# Patient Record
Sex: Female | Born: 2000 | Race: White | Hispanic: No | Smoking: Never smoker
Health system: Southern US, Community
[De-identification: ages and names within clinical notes are randomized; demographics above are authoritative.]

## PROBLEM LIST (undated history)

## (undated) ENCOUNTER — Inpatient Hospital Stay (HOSPITAL_COMMUNITY): Payer: Self-pay

## (undated) ENCOUNTER — Inpatient Hospital Stay (HOSPITAL_COMMUNITY): Payer: Medicaid Other

## (undated) DIAGNOSIS — I639 Cerebral infarction, unspecified: Secondary | ICD-10-CM

## (undated) DIAGNOSIS — Z9889 Other specified postprocedural states: Secondary | ICD-10-CM

## (undated) DIAGNOSIS — S61459A Open bite of unspecified hand, initial encounter: Secondary | ICD-10-CM

## (undated) DIAGNOSIS — L680 Hirsutism: Secondary | ICD-10-CM

## (undated) DIAGNOSIS — J302 Other seasonal allergic rhinitis: Secondary | ICD-10-CM

## (undated) DIAGNOSIS — S060X0A Concussion without loss of consciousness, initial encounter: Secondary | ICD-10-CM

## (undated) DIAGNOSIS — F909 Attention-deficit hyperactivity disorder, unspecified type: Secondary | ICD-10-CM

## (undated) DIAGNOSIS — Z872 Personal history of diseases of the skin and subcutaneous tissue: Secondary | ICD-10-CM

## (undated) HISTORY — PX: WISDOM TOOTH EXTRACTION: SHX21

## (undated) HISTORY — DX: Hirsutism: L68.0

## (undated) HISTORY — PX: CYST EXCISION: SHX5701

## (undated) HISTORY — DX: Cerebral infarction, unspecified: I63.9

## (undated) HISTORY — DX: Personal history of diseases of the skin and subcutaneous tissue: Z87.2

## (undated) HISTORY — DX: Other specified postprocedural states: Z98.890

---
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
## 2018-04-18 ENCOUNTER — Encounter (HOSPITAL_COMMUNITY): Payer: Self-pay | Admitting: Emergency Medicine

## 2018-04-18 ENCOUNTER — Other Ambulatory Visit: Payer: Self-pay

## 2018-04-18 ENCOUNTER — Ambulatory Visit (HOSPITAL_COMMUNITY)
Admission: EM | Admit: 2018-04-18 | Discharge: 2018-04-18 | Disposition: A | Discharge status: Home / Self Care | Payer: Medicaid Other | Attending: Family Medicine | Admitting: Family Medicine

## 2018-04-18 DIAGNOSIS — N898 Other specified noninflammatory disorders of vagina: Secondary | ICD-10-CM | POA: Diagnosis present

## 2018-04-18 DIAGNOSIS — N3090 Cystitis, unspecified without hematuria: Secondary | ICD-10-CM | POA: Diagnosis present

## 2018-04-18 LAB — POCT URINALYSIS DIP (DEVICE)
Bilirubin Urine: NEGATIVE
Glucose, UA: NEGATIVE mg/dL
Ketones, ur: NEGATIVE mg/dL
Nitrite: NEGATIVE
Protein, ur: NEGATIVE mg/dL
Specific Gravity, Urine: 1.02 (ref 1.005–1.030)
Urobilinogen, UA: 1 mg/dL (ref 0.0–1.0)
pH: 7.5 (ref 5.0–8.0)

## 2018-04-18 MED ORDER — FLUCONAZOLE 150 MG PO TABS: ORAL_TABLET | ORAL | 0 refills | Status: DC

## 2018-04-18 MED ORDER — CEPHALEXIN 500 MG PO CAPS: 500.0000 mg | ORAL_CAPSULE | Freq: Two times a day (BID) | ORAL | 0 refills | Status: DC

## 2018-04-18 NOTE — ED Triage Notes (Signed)
Burning with urination, urinates in small amounts.  Onset of symptoms one week ago.  Denies back pain, denies stomach pain

## 2018-04-18 NOTE — ED Provider Notes (Addendum)
[place]   [id number] 04/18/18 Arrival Time: 9678  ASSESSMENT & PLAN:  1. Cystitis   2. Vaginal discharge    Meds ordered this encounter  Medications  . cephALEXin (KEFLEX) 500 MG capsule    Sig: Take 1 capsule (500 mg total) by mouth 2 (two) times daily.    Dispense:  10 capsule    Refill:  0  . fluconazole (DIFLUCAN) 150 MG tablet    Sig: Take one tablet by mouth as a single dose. May repeat in 3 days if symptoms persist.    Dispense:  2 tablet    Refill:  0   Diflucan given upon pt request. No signs of pyelonephritis/PID. Urine culture sent.  Prefers to await vaginal cytology results; declines any empiric treatment. Declines HIV/RPR testing.   Discharge Instructions     We have sent testing for sexually transmitted infections. We will notify you of any positive results once they are received. If required, we will prescribe any medications you might need.  Please refrain from all sexual activity for at least the next seven days.     Pending: Labs Reviewed  POCT URINALYSIS DIP (DEVICE) - Abnormal; Notable for the following components:      Result Value   Hgb urine dipstick MODERATE (*)    Leukocytes,Ua TRACE (*)    All other components within normal limits  URINE CULTURE  CERVICOVAGINAL ANCILLARY ONLY    Will notify of any positive results. Instructed to refrain from sexual activity for at least seven days.  Reviewed expectations re: course of current medical issues. Questions answered. Outlined signs and symptoms indicating need for more acute intervention. Patient verbalized understanding. After Visit Summary given.   SUBJECTIVE:  Tina Gibbs is a 18 y.o. female who presents with complaint of urinary frequency and dysuria; x 1 week. No hematuria. Slight vaginal discharge for at least one week; reports h/o BV; no vaginal odor. Describes discharge as thin and clear. Afebrile. No abdominal or pelvic pain. No n/v. No rashes or lesions.  Sexually active with single female partner without regular condom use. OTC treatment: none reported..  Patient's last menstrual period was 04/11/2018.  ROS: As per HPI. All other systems negative.   OBJECTIVE:  Vitals:   04/18/18 1340  BP: 119/64  Pulse: 86  Resp: 18  Temp: 98.1 F (36.7 C)  TempSrc: Oral  SpO2: 100%    General appearance: alert, cooperative, appears stated age and no distress Throat: lips, mucosa, and tongue normal; teeth and gums normal CV: RRR Lungs: CTAB Back: no CVA tenderness; FROM at waist Abdomen: soft, non-tender; no guarding or rebound tenderness GU: deferred Skin: warm and dry Psychological: alert and cooperative; normal mood and affect.  Labs Reviewed  POCT URINALYSIS DIP (DEVICE) - Abnormal; Notable for the following components:      Result Value   Hgb urine dipstick MODERATE (*)    Leukocytes,Ua TRACE (*)    All other components within normal limits  URINE CULTURE  CERVICOVAGINAL ANCILLARY ONLY    Allergies  Allergen Reactions  . Lactose Intolerance (Gi)   . Peanut-Containing Drug Products Itching    Past Medical History:  Diagnosis Date  . Asthma    4/06 IgE + for house dust, mites, and cat; skin tested + for dog, house dust, mites, rat, tomato, aternaria alternata  . Attention deficit hyperactivity disorder 5/12  . H/O excision of epidermal inclusion cyst 10/05  . Hirsutism 3/11  . Seasonal allergies    Family  History  Problem Relation Age of Onset  . Healthy Mother   . Healthy Father    Social History   Socioeconomic History  . Marital status: Single    Spouse name: Not on file  . Number of children: Not on file  . Years of education: Not on file  . Highest education level: Not on file  Occupational History  . Not on file  Social Needs  . Financial resource strain: Not on file  . Food insecurity:    Worry: Not on file    Inability: Not on file  . Transportation needs:    Medical: Not on file    Non-medical:  Not on file  Tobacco Use  . Smoking status: Never Smoker  . Smokeless tobacco: Never Used  Substance and Sexual Activity  . Alcohol use: No  . Drug use: No  . Sexual activity: Not on file  Lifestyle  . Physical activity:    Days per week: Not on file    Minutes per session: Not on file  . Stress: Not on file  Relationships  . Social connections:    Talks on phone: Not on file    Gets together: Not on file    Attends religious service: Not on file    Active member of club or organization: Not on file    Attends meetings of clubs or organizations: Not on file    Relationship status: Not on file  . Intimate partner violence:    Fear of current or ex partner: Not on file    Emotionally abused: Not on file    Physically abused: Not on file    Forced sexual activity: Not on file  Other Topics Concern  . Not on file  Social History Narrative   Lives with mom and 2 sisters.  Mom uses public transportation.  Does not have a car.   Patient in middle school.          Vanessa Kick, MD 04/18/18 1357    Vanessa Kick, MD 04/18/18 1357    Vanessa Kick, MD 04/18/18 1359

## 2018-04-18 NOTE — ED Notes (Signed)
Patient requested diflucan, notified dr hagler.  Med sent through [facility] to pharmacy and patient notified

## 2018-04-18 NOTE — Discharge Instructions (Signed)
We have sent testing for sexually transmitted infections. We will notify you of any positive results once they are received. If required, we will prescribe any medications you might need.  Please refrain from all sexual activity for at least the next seven days.  

## 2018-04-19 LAB — CERVICOVAGINAL ANCILLARY ONLY
Bacterial vaginitis: NEGATIVE
Candida vaginitis: NEGATIVE
Chlamydia: NEGATIVE
Neisseria Gonorrhea: NEGATIVE
Trichomonas: NEGATIVE

## 2018-04-20 ENCOUNTER — Telehealth (HOSPITAL_COMMUNITY): Payer: Self-pay | Admitting: Emergency Medicine

## 2018-04-20 LAB — URINE CULTURE: Culture: 50000 — AB

## 2018-04-20 NOTE — Telephone Encounter (Signed)
Urine culture was positive for  STAPHYLOCOCCUS SPECIES (COAGULASE NEGATIVE  and was given keflex  at urgent care visit. Pt contacted and made aware, educated on completing antibiotic and to follow up if symptoms are persistent. Verbalized understanding.

## 2018-06-09 ENCOUNTER — Encounter (HOSPITAL_COMMUNITY): Payer: Self-pay

## 2018-06-09 ENCOUNTER — Inpatient Hospital Stay (HOSPITAL_COMMUNITY)
Admission: AD | Admit: 2018-06-09 | Discharge: 2018-06-09 | Disposition: A | Discharge status: Home / Self Care | Payer: Medicaid Other | Attending: Obstetrics & Gynecology | Admitting: Obstetrics & Gynecology

## 2018-06-09 ENCOUNTER — Other Ambulatory Visit: Payer: Self-pay

## 2018-06-09 ENCOUNTER — Inpatient Hospital Stay (HOSPITAL_COMMUNITY): Payer: Medicaid Other

## 2018-06-09 DIAGNOSIS — O26899 Other specified pregnancy related conditions, unspecified trimester: Secondary | ICD-10-CM | POA: Diagnosis not present

## 2018-06-09 DIAGNOSIS — O99511 Diseases of the respiratory system complicating pregnancy, first trimester: Secondary | ICD-10-CM | POA: Diagnosis not present

## 2018-06-09 DIAGNOSIS — O3680X0 Pregnancy with inconclusive fetal viability, not applicable or unspecified: Secondary | ICD-10-CM

## 2018-06-09 DIAGNOSIS — Z3A01 Less than 8 weeks gestation of pregnancy: Secondary | ICD-10-CM | POA: Diagnosis not present

## 2018-06-09 DIAGNOSIS — J45909 Unspecified asthma, uncomplicated: Secondary | ICD-10-CM | POA: Diagnosis not present

## 2018-06-09 DIAGNOSIS — O219 Vomiting of pregnancy, unspecified: Secondary | ICD-10-CM | POA: Diagnosis not present

## 2018-06-09 DIAGNOSIS — R109 Unspecified abdominal pain: Secondary | ICD-10-CM | POA: Diagnosis not present

## 2018-06-09 DIAGNOSIS — M549 Dorsalgia, unspecified: Secondary | ICD-10-CM | POA: Diagnosis not present

## 2018-06-09 DIAGNOSIS — O26891 Other specified pregnancy related conditions, first trimester: Secondary | ICD-10-CM | POA: Insufficient documentation

## 2018-06-09 LAB — CBC WITH DIFFERENTIAL/PLATELET
Abs Immature Granulocytes: 0.02 10*3/uL (ref 0.00–0.07)
Basophils Absolute: 0 10*3/uL (ref 0.0–0.1)
Basophils Relative: 0 %
Eosinophils Absolute: 0 10*3/uL (ref 0.0–0.5)
Eosinophils Relative: 0 %
HCT: 35.1 % — ABNORMAL LOW (ref 36.0–46.0)
Hemoglobin: 12.3 g/dL (ref 12.0–15.0)
Immature Granulocytes: 0 %
Lymphocytes Relative: 19 %
Lymphs Abs: 1.3 10*3/uL (ref 0.7–4.0)
MCH: 31.8 pg (ref 26.0–34.0)
MCHC: 35 g/dL (ref 30.0–36.0)
MCV: 90.7 fL (ref 80.0–100.0)
Monocytes Absolute: 0.8 10*3/uL (ref 0.1–1.0)
Monocytes Relative: 12 %
Neutro Abs: 4.7 10*3/uL (ref 1.7–7.7)
Neutrophils Relative %: 69 %
Platelets: 191 10*3/uL (ref 150–400)
RBC: 3.87 MIL/uL (ref 3.87–5.11)
RDW: 11.9 % (ref 11.5–15.5)
WBC: 6.9 10*3/uL (ref 4.0–10.5)
nRBC: 0 % (ref 0.0–0.2)

## 2018-06-09 LAB — POC URINE PREG, ED: Preg Test, Ur: POSITIVE — AB

## 2018-06-09 LAB — HCG, QUANTITATIVE, PREGNANCY: hCG, Beta Chain, Quant, S: 666 m[IU]/mL — ABNORMAL HIGH (ref <5)

## 2018-06-09 LAB — WET PREP, GENITAL
Clue Cells Wet Prep HPF POC: NONE SEEN
Sperm: NONE SEEN
Trich, Wet Prep: NONE SEEN
Yeast Wet Prep HPF POC: NONE SEEN

## 2018-06-09 IMAGING — US OBSTETRIC <14 WK ULTRASOUND
1 series · 15 of 28 positions shown · non-contrast
Comparison: None.

CLINICAL DATA: Abdominal pain

EXAM:
OBSTETRIC <14 WK US AND TRANSVAGINAL OB US
TECHNIQUE: Both transabdominal and transvaginal ultrasound examinations were
performed for complete evaluation of the gestation as well as the
maternal uterus, adnexal regions, and pelvic cul-de-sac.
Transvaginal technique was performed to assess early pregnancy.

[Series 1: obstetric <14 wk ultrasound · 15 of 53 slices shown]
[im 1/53]
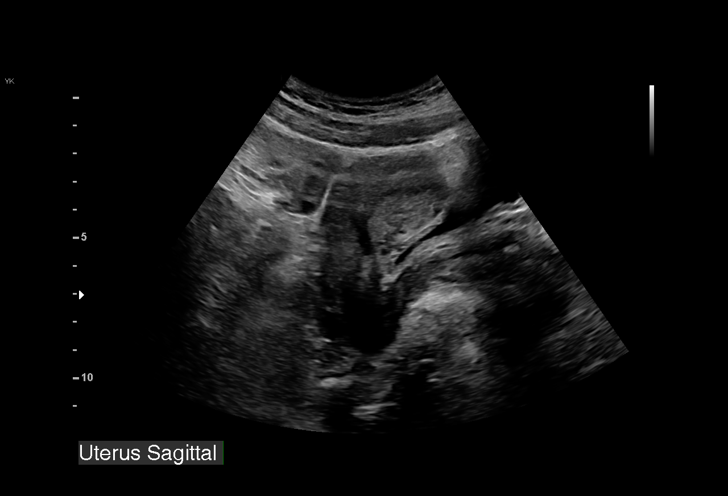
[im 4/53]
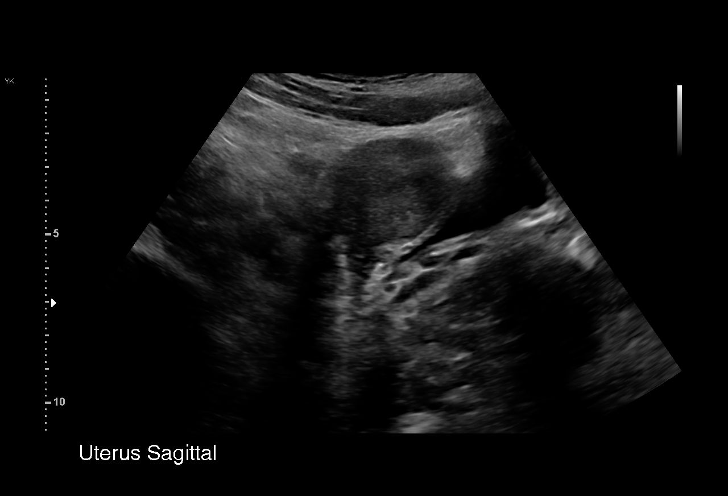
[im 8/53]
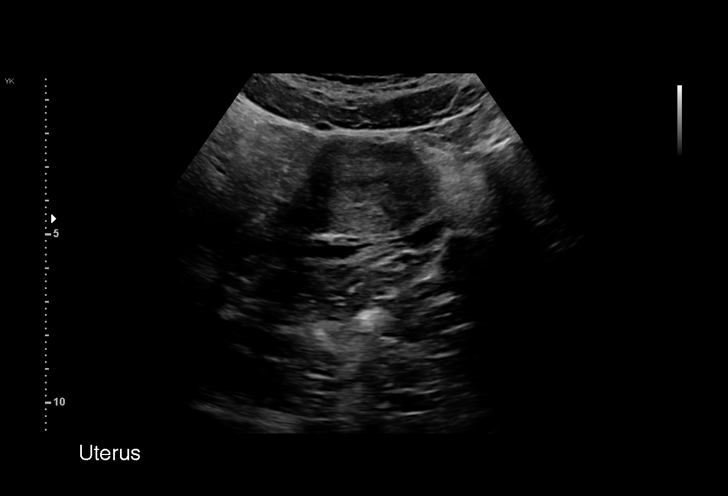
[im 12/53]
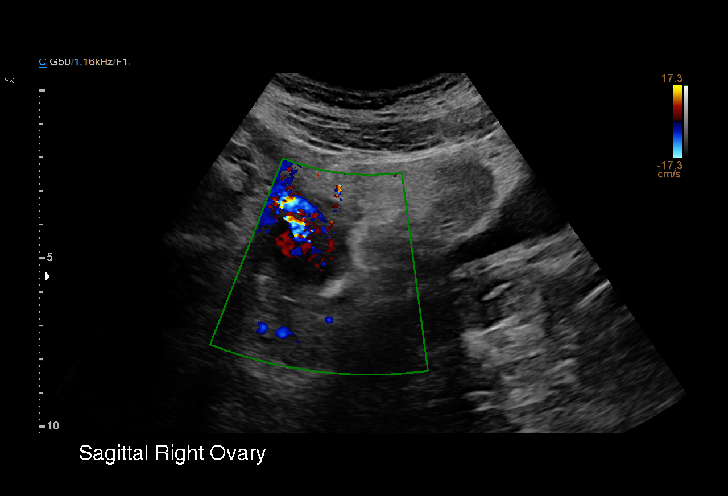
[im 16/53]
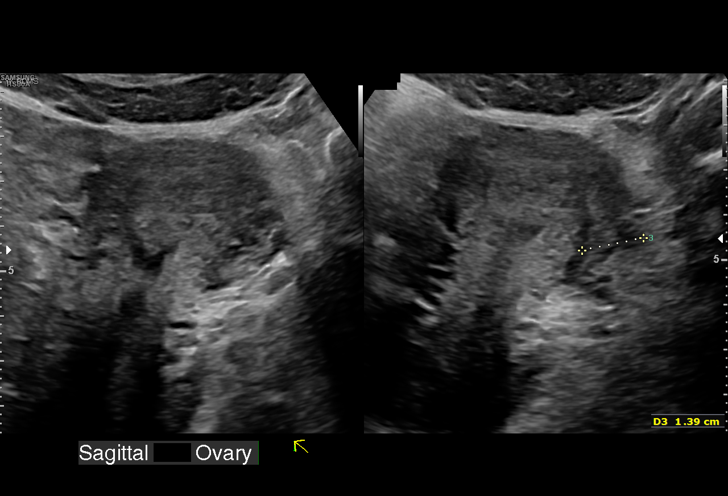
[im 20/53]
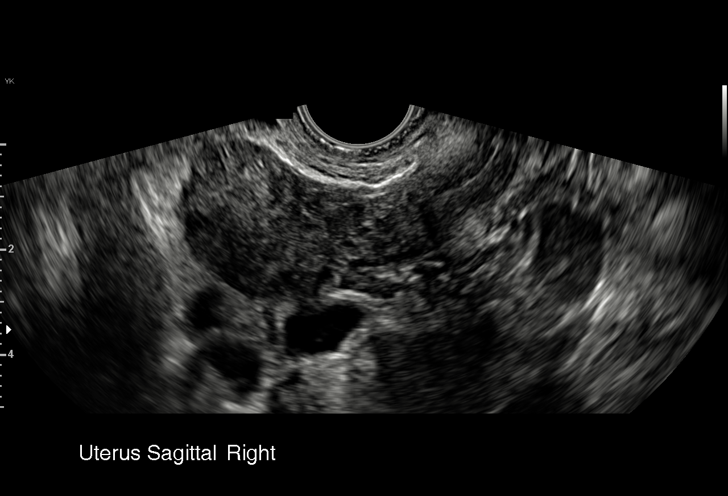
[im 24/53]
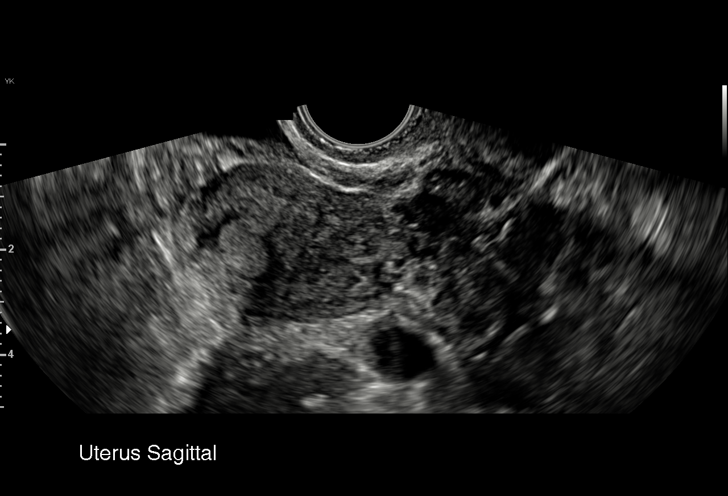
[im 27/53]
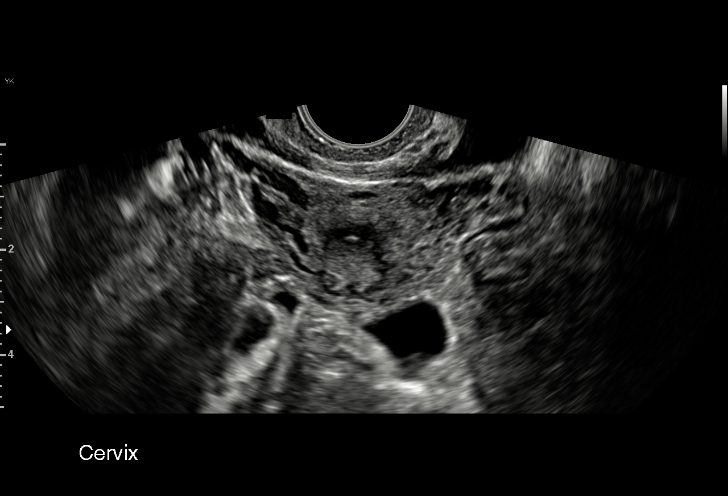
[im 29/53]
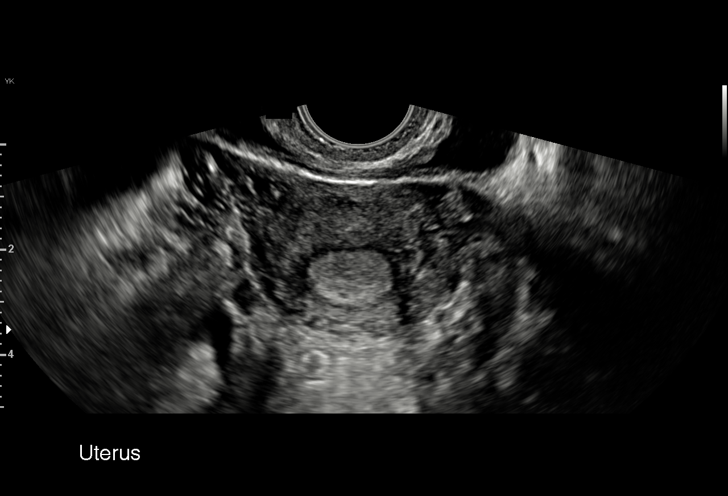
[im 33/53]
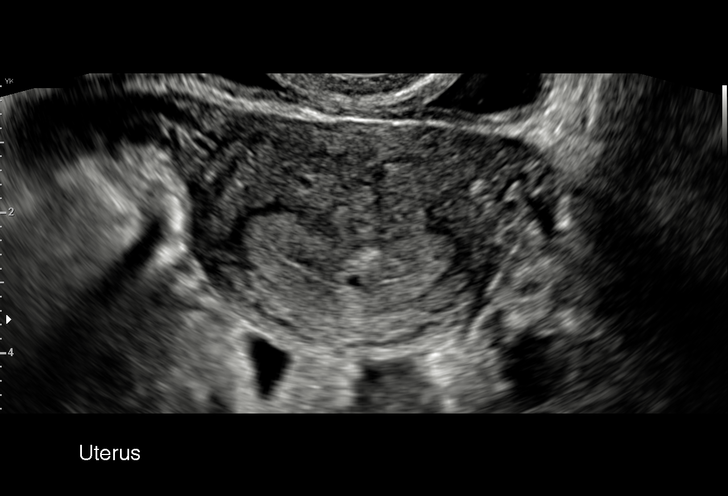
[im 37/53]
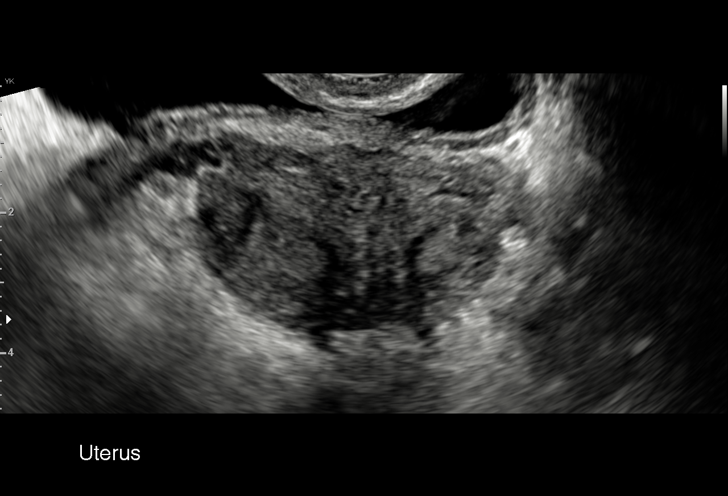
[im 41/53]
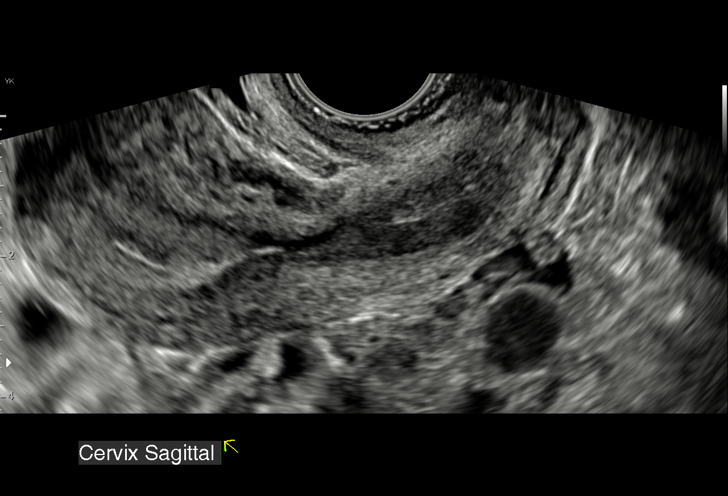
[im 45/53]
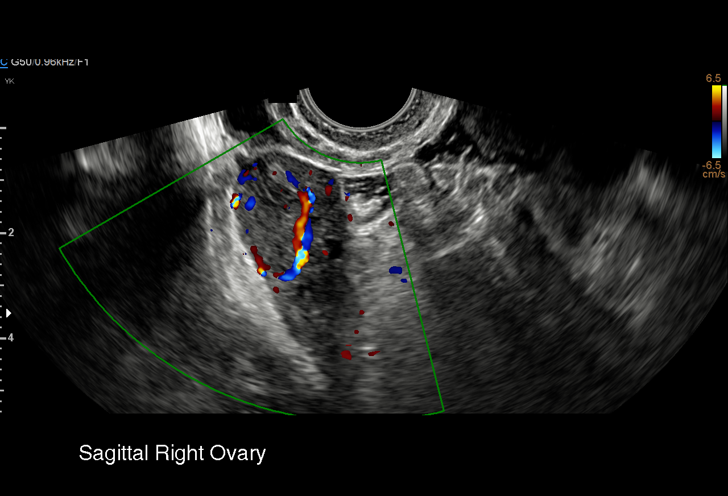
[im 49/53]
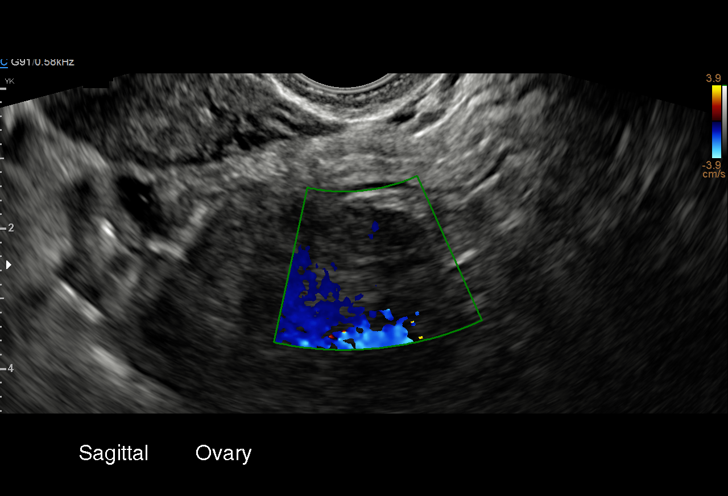
[im 53/53]
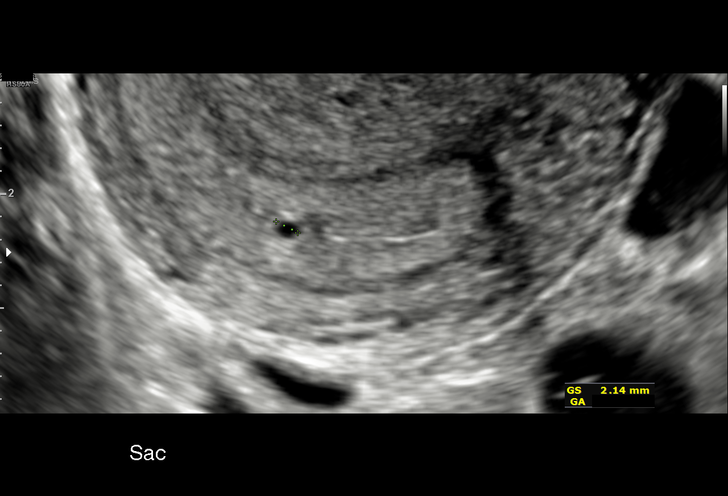

[15 of 28 positions shown; findings below may reference images not displayed]

FINDINGS: Intrauterine gestational sac: Single

Yolk sac:  Not Visualized.

Embryo:  Not Visualized.

Cardiac Activity: Not Visualized.

MSD: 2.17  mm   4 w   6  d

Subchorionic hemorrhage:  None visualized.

Maternal uterus/adnexae: Normal bilateral ovaries. No adnexal mass.
Trace pelvic free fluid.
IMPRESSION: Probable early intrauterine gestational sac, but no yolk sac, fetal
pole, or cardiac activity yet visualized. Recommend follow-up
quantitative B-HCG levels and follow-up US in 14 days to assess
viability. This recommendation follows SRU consensus guidelines:
Diagnostic Criteria for Nonviable Pregnancy Early in the First
Trimester. N Engl J Med [1D]; [DATE].

## 2018-06-09 MED ORDER — ACETAMINOPHEN 500 MG PO TABS
1000.0000 mg | ORAL_TABLET | Freq: Once | ORAL | Status: AC
  Administered 2018-06-09: 1000 mg via ORAL
  Filled 2018-06-09: qty 2

## 2018-06-09 NOTE — MAU Provider Note (Signed)
History     CSN: [id number]  Arrival date and time: 06/09/18 1659   First Provider Initiated Contact with Patient 06/09/18 1808      Chief Complaint  Patient presents with  . Abdominal Pain  . Back Pain   HPI Ms. Tina Gibbs is a 18 y.o. G1P0 at [redacted]w[redacted]d who presents to MAU today with complaint of lower abdominal cramping for the last few days. The patient states she has had multiple +HPTs. She denies vaginal bleeding, abnormal discharge, respiratory symptoms or fever. She has had some occasional nausea without vomiting. LMP 05/10/18  OB History    Gravida  1   Para      Term      Preterm      AB      Living        SAB      TAB      Ectopic      Multiple      Live Births              Past Medical History:  Diagnosis Date  . Asthma    4/06 IgE + for house dust, mites, and cat; skin tested + for dog, house dust, mites, rat, tomato, aternaria alternata  . Attention deficit hyperactivity disorder 5/12  . H/O excision of epidermal inclusion cyst 10/05  . Hirsutism 3/11  . Seasonal allergies     Past Surgical History:  Procedure Laterality Date  . CYST EXCISION Left    under left eye    Family History  Problem Relation Age of Onset  . Healthy Mother   . Healthy Father     Social History   Tobacco Use  . Smoking status: Never Smoker  . Smokeless tobacco: Never Used  Substance Use Topics  . Alcohol use: No  . Drug use: No    Allergies:  Allergies  Allergen Reactions  . Lactose Intolerance (Gi)   . Peanut-Containing Drug Products Itching    Medications Prior to Admission  Medication Sig Dispense Refill Last Dose  . cephALEXin (KEFLEX) 500 MG capsule Take 1 capsule (500 mg total) by mouth 2 (two) times daily. 10 capsule 0   . fluconazole (DIFLUCAN) 150 MG tablet Take one tablet by mouth as a single dose. May repeat in 3 days if symptoms persist. 2 tablet 0     Review of Systems  Constitutional: Negative for fever.  Gastrointestinal:  Negative for abdominal pain, constipation, diarrhea, nausea and vomiting.  Genitourinary: Negative for dysuria, frequency, urgency, vaginal bleeding and vaginal discharge.   Physical Exam   Blood pressure 123/75, pulse 96, temperature 98.7 F (37.1 C), temperature source Oral, resp. rate 18, last menstrual period 05/10/2018, SpO2 100 %.  Physical Exam  Nursing note and vitals reviewed. Constitutional: She is oriented to person, place, and time. She appears well-developed and well-nourished. No distress.  HENT:  Head: Normocephalic and atraumatic.  Cardiovascular: Normal rate.  Respiratory: Effort normal.  GI: Soft. She exhibits no distension and no mass. There is no abdominal tenderness. There is no rebound and no guarding.  Genitourinary: Uterus is not enlarged and not tender. Cervix exhibits no motion tenderness. Right adnexum displays no mass and no tenderness. Left adnexum displays no mass and no tenderness.  Neurological: She is alert and oriented to person, place, and time.  Skin: Skin is warm and dry. No erythema.  Psychiatric: She has a normal mood and affect.    Results for orders placed or performed during  the hospital encounter of 06/09/18 (from the past 24 hour(s))  POC Urine Pregnancy, ED (not at [facility])     Status: Abnormal   Collection Time: 06/09/18  5:11 PM  Result Value Ref Range   Preg Test, Ur POSITIVE (A) NEGATIVE  Wet prep, genital     Status: Abnormal   Collection Time: 06/09/18  6:13 PM  Result Value Ref Range   Yeast Wet Prep HPF POC NONE SEEN NONE SEEN   Trich, Wet Prep NONE SEEN NONE SEEN   Clue Cells Wet Prep HPF POC NONE SEEN NONE SEEN   WBC, Wet Prep HPF POC RARE (A) NONE SEEN   Sperm NONE SEEN   CBC with Differential/Platelet     Status: Abnormal   Collection Time: 06/09/18  6:21 PM  Result Value Ref Range   WBC 6.9 4.0 - 10.5 K/uL   RBC 3.87 3.87 - 5.11 MIL/uL   Hemoglobin 12.3 12.0 - 15.0 g/dL   HCT 35.1 (L) 36.0 - 46.0 %   MCV 90.7 80.0 - 100.0  fL   MCH 31.8 26.0 - 34.0 pg   MCHC 35.0 30.0 - 36.0 g/dL   RDW 11.9 11.5 - 15.5 %   Platelets 191 150 - 400 K/uL   nRBC 0.0 0.0 - 0.2 %   Neutrophils Relative % 69 %   Neutro Abs 4.7 1.7 - 7.7 K/uL   Lymphocytes Relative 19 %   Lymphs Abs 1.3 0.7 - 4.0 K/uL   Monocytes Relative 12 %   Monocytes Absolute 0.8 0.1 - 1.0 K/uL   Eosinophils Relative 0 %   Eosinophils Absolute 0.0 0.0 - 0.5 K/uL   Basophils Relative 0 %   Basophils Absolute 0.0 0.0 - 0.1 K/uL   Immature Granulocytes 0 %   Abs Immature Granulocytes 0.02 0.00 - 0.07 K/uL  ABO/Rh     Status: None   Collection Time: 06/09/18  6:21 PM  Result Value Ref Range   ABO/RH(D)      O POS Performed at [facility], [address], [place], [place] [address]   hCG, quantitative, pregnancy     Status: Abnormal   Collection Time: 06/09/18  6:21 PM  Result Value Ref Range   hCG, Beta Chain, Quant, S 666 (H) <5 mIU/mL    US Ob Comp Less 14 Wks  Result Date: 06/09/2018 CLINICAL DATA:  Abdominal pain EXAM: OBSTETRIC <14 WK [place] AND TRANSVAGINAL OB US TECHNIQUE: Both transabdominal and transvaginal ultrasound examinations were performed for complete evaluation of the gestation as well as the maternal uterus, adnexal regions, and pelvic cul-de-sac. Transvaginal technique was performed to assess early pregnancy. COMPARISON:  None. FINDINGS: Intrauterine gestational sac: Single Yolk sac:  Not Visualized. Embryo:  Not Visualized. Cardiac Activity: Not Visualized. MSD: 2.17  mm   4 w   6  d Subchorionic hemorrhage:  None visualized. Maternal uterus/adnexae: Normal bilateral ovaries. No adnexal mass. Trace pelvic free fluid. IMPRESSION: Probable early intrauterine gestational sac, but no yolk sac, fetal pole, or cardiac activity yet visualized. Recommend follow-up quantitative B-HCG levels and follow-up US in 14 days to assess viability. This recommendation follows SRU consensus guidelines: Diagnostic Criteria for Nonviable Pregnancy Early in  the First Trimester. [employer] Med 2013; 924:2683-41. Electronically Signed   By: Kathreen Devoid   On: 06/09/2018 19:11   US Ob Transvaginal  Result Date: 06/09/2018 CLINICAL DATA:  Abdominal pain EXAM: OBSTETRIC <14 WK [place] AND TRANSVAGINAL OB US TECHNIQUE: Both transabdominal and transvaginal  ultrasound examinations were performed for complete evaluation of the gestation as well as the maternal uterus, adnexal regions, and pelvic cul-de-sac. Transvaginal technique was performed to assess early pregnancy. COMPARISON:  None. FINDINGS: Intrauterine gestational sac: Single Yolk sac:  Not Visualized. Embryo:  Not Visualized. Cardiac Activity: Not Visualized. MSD: 2.17  mm   4 w   6  d Subchorionic hemorrhage:  None visualized. Maternal uterus/adnexae: Normal bilateral ovaries. No adnexal mass. Trace pelvic free fluid. IMPRESSION: Probable early intrauterine gestational sac, but no yolk sac, fetal pole, or cardiac activity yet visualized. Recommend follow-up quantitative B-HCG levels and follow-up US in 14 days to assess viability. This recommendation follows SRU consensus guidelines: Diagnostic Criteria for Nonviable Pregnancy Early in the First Trimester. [employer] Med 2013; 034:7425-95. Electronically Signed   By: Kathreen Devoid   On: 06/09/2018 19:11    MAU Course  Procedures None  MDM +UPT UA, wet prep, GC/chlamydia, CBC, ABO/Rh, quant hCG, HIV, RPR and [place] today to rule out ectopic pregnancy  Assessment and Plan  A: Pregnancy of unknown location Abdominal pain in pregnancy, first trimester   P: Discharge home Tylenol PRN for pain advised Ectopic precautions discussed Patient advised to follow-up with [facility] on 5/26 at 9:00 am for repeat STAT labs Patient may return to MAU as needed or if her condition were to change or worsen   Kerry Hough, PA-C 06/09/2018, 7:44 PM

## 2018-06-09 NOTE — Discharge Instructions (Signed)

## 2018-06-09 NOTE — MAU Note (Signed)
Tina Gibbs is a 18 y.o. at [redacted]w[redacted]d here in MAU reporting: + UPT yesterday, started cramping on Thursday, pain is intermittent. Also having left lower back pain, this pain is constant. No bleeding, no abnormal vaginal discharge  LMP: 05/10/18  Onset of complaint: ongoing  Pain score: abdomen 5/10, back 5/10  Vitals:   06/09/18 1802  BP: 123/75  Pulse: 96  Resp: 18  Temp: 98.7 F (37.1 C)  SpO2: 100%      Lab orders placed from triage: none

## 2018-06-09 NOTE — ED Triage Notes (Signed)
PT states she thinks she might be pregnant, took 4 at home pregnancy tests that were all positive. Pt now reports cramping. Obtaining POC preg test.

## 2018-06-10 LAB — ABO/RH: ABO/RH(D): O POS

## 2018-06-10 LAB — RPR: RPR Ser Ql: NONREACTIVE

## 2018-06-10 LAB — HIV ANTIBODY (ROUTINE TESTING W REFLEX): HIV Screen 4th Generation wRfx: NONREACTIVE

## 2018-06-12 ENCOUNTER — Ambulatory Visit: Payer: Medicaid Other

## 2018-06-12 ENCOUNTER — Other Ambulatory Visit: Payer: Self-pay

## 2018-06-12 DIAGNOSIS — O26899 Other specified pregnancy related conditions, unspecified trimester: Secondary | ICD-10-CM

## 2018-06-12 DIAGNOSIS — O3680X0 Pregnancy with inconclusive fetal viability, not applicable or unspecified: Secondary | ICD-10-CM

## 2018-06-12 DIAGNOSIS — R109 Unspecified abdominal pain: Secondary | ICD-10-CM

## 2018-06-12 LAB — GC/CHLAMYDIA PROBE AMP (~~LOC~~) NOT AT ARMC
Chlamydia: NEGATIVE
Neisseria Gonorrhea: NEGATIVE

## 2018-06-12 LAB — BETA HCG QUANT (REF LAB): hCG Quant: 1301 m[IU]/mL

## 2018-06-12 NOTE — Progress Notes (Signed)
Pt here today for STAT Beta Lab.  Pt c/o left lower back near ribs pain that is tolerable 2/10 on pain scale.  Pt advised that it will take at least two hours for results or longer and that we will call her with f/u.  Pt agreed.   Received [facility] critical beta level of 1301.  Notified Dr. Hulan Fray who recommends OB US in about 14 days.  OB US scheduled for 06/21/18 @ 1445.  LM for pt to please give the office a return call and that we have scheduled her  OB US appt.on 06/21/18.  Attempted to contact pt again LM for pt about [place] appt.  MyChart message sent.

## 2018-06-16 ENCOUNTER — Inpatient Hospital Stay (HOSPITAL_COMMUNITY)
Admission: AD | Admit: 2018-06-16 | Discharge: 2018-06-16 | Disposition: A | Discharge status: Home / Self Care | Payer: Medicaid Other | Attending: Obstetrics and Gynecology | Admitting: Obstetrics and Gynecology

## 2018-06-16 ENCOUNTER — Encounter (HOSPITAL_COMMUNITY): Payer: Self-pay

## 2018-06-16 ENCOUNTER — Other Ambulatory Visit: Payer: Self-pay

## 2018-06-16 DIAGNOSIS — O99511 Diseases of the respiratory system complicating pregnancy, first trimester: Secondary | ICD-10-CM | POA: Insufficient documentation

## 2018-06-16 DIAGNOSIS — E739 Lactose intolerance, unspecified: Secondary | ICD-10-CM | POA: Diagnosis not present

## 2018-06-16 DIAGNOSIS — J45909 Unspecified asthma, uncomplicated: Secondary | ICD-10-CM | POA: Insufficient documentation

## 2018-06-16 DIAGNOSIS — Z79899 Other long term (current) drug therapy: Secondary | ICD-10-CM | POA: Diagnosis not present

## 2018-06-16 DIAGNOSIS — O219 Vomiting of pregnancy, unspecified: Secondary | ICD-10-CM | POA: Insufficient documentation

## 2018-06-16 DIAGNOSIS — Z3A01 Less than 8 weeks gestation of pregnancy: Secondary | ICD-10-CM | POA: Insufficient documentation

## 2018-06-16 DIAGNOSIS — Z91010 Allergy to peanuts: Secondary | ICD-10-CM | POA: Insufficient documentation

## 2018-06-16 DIAGNOSIS — O2301 Infections of kidney in pregnancy, first trimester: Secondary | ICD-10-CM | POA: Diagnosis present

## 2018-06-16 LAB — WET PREP, GENITAL
Clue Cells Wet Prep HPF POC: NONE SEEN
Sperm: NONE SEEN
Trich, Wet Prep: NONE SEEN
Yeast Wet Prep HPF POC: NONE SEEN

## 2018-06-16 LAB — CBC WITH DIFFERENTIAL/PLATELET
Abs Immature Granulocytes: 0.06 10*3/uL (ref 0.00–0.07)
Basophils Absolute: 0 10*3/uL (ref 0.0–0.1)
Basophils Relative: 0 %
Eosinophils Absolute: 0 10*3/uL (ref 0.0–0.5)
Eosinophils Relative: 0 %
HCT: 34.5 % — ABNORMAL LOW (ref 36.0–46.0)
Hemoglobin: 12.3 g/dL (ref 12.0–15.0)
Immature Granulocytes: 1 %
Lymphocytes Relative: 12 %
Lymphs Abs: 1.2 10*3/uL (ref 0.7–4.0)
MCH: 31.5 pg (ref 26.0–34.0)
MCHC: 35.7 g/dL (ref 30.0–36.0)
MCV: 88.5 fL (ref 80.0–100.0)
Monocytes Absolute: 1.2 10*3/uL — ABNORMAL HIGH (ref 0.1–1.0)
Monocytes Relative: 12 %
Neutro Abs: 7.8 10*3/uL — ABNORMAL HIGH (ref 1.7–7.7)
Neutrophils Relative %: 75 %
Platelets: 267 10*3/uL (ref 150–400)
RBC: 3.9 MIL/uL (ref 3.87–5.11)
RDW: 11.6 % (ref 11.5–15.5)
WBC: 10.3 10*3/uL (ref 4.0–10.5)
nRBC: 0 % (ref 0.0–0.2)

## 2018-06-16 LAB — BASIC METABOLIC PANEL
Anion gap: 11 (ref 5–15)
BUN: 7 mg/dL (ref 6–20)
CO2: 22 mmol/L (ref 22–32)
Calcium: 9.8 mg/dL (ref 8.9–10.3)
Chloride: 97 mmol/L — ABNORMAL LOW (ref 98–111)
Creatinine, Ser: 0.55 mg/dL (ref 0.44–1.00)
GFR calc Af Amer: >60 mL/min (ref >60)
GFR calc non Af Amer: >60 mL/min (ref >60)
Glucose, Bld: 113 mg/dL — ABNORMAL HIGH (ref 70–99)
Potassium: 3.6 mmol/L (ref 3.5–5.1)
Sodium: 130 mmol/L — ABNORMAL LOW (ref 135–145)

## 2018-06-16 LAB — URINALYSIS, ROUTINE W REFLEX MICROSCOPIC
Bilirubin Urine: NEGATIVE
Glucose, UA: NEGATIVE mg/dL
Ketones, ur: 20 mg/dL — AB
Nitrite: NEGATIVE
Protein, ur: 100 mg/dL — AB
RBC / HPF: >50 RBC/hpf — ABNORMAL HIGH (ref 0–5)
Specific Gravity, Urine: 1.024 (ref 1.005–1.030)
WBC, UA: >50 WBC/hpf — ABNORMAL HIGH (ref 0–5)
pH: 6 (ref 5.0–8.0)

## 2018-06-16 MED ORDER — METOCLOPRAMIDE HCL 5 MG/ML IJ SOLN
10.0000 mg | Freq: Once | INTRAMUSCULAR | Status: AC
  Administered 2018-06-16: 11:00:00 10 mg via INTRAVENOUS
  Filled 2018-06-16: qty 2

## 2018-06-16 MED ORDER — ACETAMINOPHEN 325 MG PO TABS: 650.0000 mg | ORAL_TABLET | ORAL | 0 refills | Status: AC | PRN

## 2018-06-16 MED ORDER — CEPHALEXIN 500 MG PO CAPS: 500.0000 mg | ORAL_CAPSULE | Freq: Four times a day (QID) | ORAL | 0 refills | Status: DC

## 2018-06-16 MED ORDER — PREPLUS 27-1 MG PO TABS: 1.0000 | ORAL_TABLET | Freq: Every day | ORAL | 1 refills | Status: DC

## 2018-06-16 MED ORDER — METOCLOPRAMIDE HCL 10 MG PO TABS: 10.0000 mg | ORAL_TABLET | Freq: Three times a day (TID) | ORAL | 0 refills | Status: DC | PRN

## 2018-06-16 MED ORDER — SODIUM CHLORIDE 0.9 % IV SOLN
INTRAVENOUS | Status: DC
  Administered 2018-06-16: 10:00:00 via INTRAVENOUS

## 2018-06-16 MED ORDER — SODIUM CHLORIDE 0.9 % IV SOLN
2.0000 g | Freq: Once | INTRAVENOUS | Status: AC
  Administered 2018-06-16: 11:00:00 2 g via INTRAVENOUS
  Filled 2018-06-16: qty 20

## 2018-06-16 NOTE — Discharge Instructions (Signed)
Pregnancy and Urinary Tract Infection What is a urinary tract infection?  A urinary tract infection (UTI) is an infection of any part of the urinary tract. This includes the kidneys, the tubes that connect your kidneys to your bladder (ureters), the bladder, and the tube that carries urine out of your body (urethra). These organs make, store, and get rid of urine in the body.  An upper UTI affects the ureters and kidneys (pyelonephritis), and a lower UTI affects the bladder (cystitis) and urethra (urethritis). Most urinary tract infections are caused by bacteria in your genital area, around the entrance to your urinary tract (urethra). These bacteria grow and cause irritation and inflammation of your urinary tract. Why am I more likely to get a UTI during pregnancy? You are more likely to develop a UTI during pregnancy because:  The physical and hormonal changes your body goes through can make it easier for bacteria to get into your urinary tract.  Your growing baby puts pressure on your uterus and can affect urine flow. Does a UTI place my baby at risk? An untreated UTI during pregnancy could lead to a kidney infection, which can cause health problems that could affect your baby. Possible complications of an untreated UTI include:  Having your baby before 37 weeks of pregnancy (premature).  Having a baby with a low birth weight.  Developing high blood pressure during pregnancy (preeclampsia).  Having a low hemoglobin level (anemia). What are the symptoms of a UTI? Symptoms of a UTI include:  Needing to urinate right away (urgently).  Frequent urination or passing small amounts of urine frequently.  Pain or burning with urination.  Blood in the urine.  Urine that smells bad or unusual.  Trouble urinating.  Cloudy urine.  Pain in the abdomen or lower back.  Vaginal discharge. You may also have:  Vomiting or a decreased appetite.  Confusion.  Irritability or  tiredness.  A fever.  Diarrhea. What are the treatment options for a UTI during pregnancy? Treatment for this condition may include:  Antibiotic medicines that are safe to take during pregnancy.  Other medicines to treat less common causes of UTI. How can I prevent a UTI? To prevent a UTI:  Go to the bathroom as soon as you feel the need. Do not hold urine for long periods of time.  Always wipe from front to back after a bowel movement. Use each tissue one time when you wipe.  Empty your bladder after sex.  Keep your genital area dry.  Drink 6-10 glasses of water each day.  Do not douche or use deodorant sprays. Contact a health care provider if:  Your symptoms do not improve or they get worse.  You have abnormal vaginal discharge. Get help right away if:  You have a fever.  You have nausea and vomiting.  You have back or side pain.  You feel contractions in your uterus.  You have lower belly pain.  You have a gush of fluid from your vagina.  You have blood in your urine. Summary  A urinary tract infection (UTI) is an infection of any part of the urinary tract, which includes the kidneys, ureters, bladder, and urethra.  Most urinary tract infections are caused by bacteria in your genital area, around the entrance to your urinary tract (urethra).  You are more likely to develop a UTI during pregnancy.  If you were prescribed an antibiotic, take it as told by your health care provider. Do not stop taking the  bladder, and urethra.  · Most urinary tract infections are caused by bacteria in your genital area, around the entrance to your urinary tract (urethra).  · You are more likely to develop a UTI during pregnancy.  · If you were prescribed an antibiotic, take it as told by your health care provider. Do not stop taking the antibiotic even if you start to feel better.  This information is not intended to replace advice given to you by your health care provider. Make sure you discuss any questions you have with your health care provider.  Document Released: 04/30/2010 Document Revised: 02/28/2017 Document Reviewed: 11/24/2014  Elsevier Interactive Patient Education © 2019 [employer]

## 2018-06-16 NOTE — Progress Notes (Signed)
Pt states her nausea is a little better and was able to keep down some crackers and water.

## 2018-06-16 NOTE — MAU Note (Signed)
Tina Gibbs is a 18 y.o. at [redacted]w[redacted]d here in MAU reporting: states she think she has a yeast infection from the gel from the u/s last week. States she is having a think cottage cheese discharge. Back pain ongoing since last visit, states tylenol helps but then the pain comes back. Also reporting nausea/vomiting.  Onset of complaint: ongoing  Pain score: 8/10  Vitals:   06/16/18 0846  BP: 109/65  Pulse: (!) 108  Resp: 16  Temp: 98.5 F (36.9 C)  SpO2: 99%      Lab orders placed from triage: UA

## 2018-06-16 NOTE — MAU Provider Note (Signed)
Chief Complaint: Back Pain; Nausea; and Emesis   First Provider Initiated Contact with Patient 06/16/18 0908     SUBJECTIVE HPI: Tina Gibbs is a 18 y.o. G1P0 at [redacted]w[redacted]d who presents to Maternity Admissions reporting back pain & nausea. Reports symptoms for the last few weeks that have worsened in the last 3 days. Describes aching pain in her right flank area that is worse when she lies on that side. Has been taking tylenol with some relief. Last took tylenol yesterday. Also has had worsening n/v. Denies fever but states at times she feels really hot. Hx of UTI 1-2 times per year. Denies dysuria, hematuria, abdominal pain, or vaginal bleeding. Also complains of thick "cottage cheese" like discharge since her last exam. States she normally gets a yeast infection with use of any kind of lubricant.   Location: right flank Quality: aching Severity: 8/10 on pain scale Duration: 2 weeks, worse for 3 days Timing: constant Modifying factors: worse with touch & lying on right side. Some improvement with tylenol Associated signs and symptoms: n/v  Past Medical History:  Diagnosis Date  . Asthma    4/06 IgE + for house dust, mites, and cat; skin tested + for dog, house dust, mites, rat, tomato, aternaria alternata  . Attention deficit hyperactivity disorder 5/12  . H/O excision of epidermal inclusion cyst 10/05  . Hirsutism 3/11  . Seasonal allergies    OB History  Gravida Para Term Preterm AB Living  1            SAB TAB Ectopic Multiple Live Births               # Outcome Date GA Lbr Len/2nd Weight Sex Delivery Anes PTL Lv  1 Current            Past Surgical History:  Procedure Laterality Date  . CYST EXCISION Left    under left eye   Social History   Socioeconomic History  . Marital status: Single    Spouse name: Not on file  . Number of children: Not on file  . Years of education: Not on file  . Highest education level: Not on file  Occupational History  . Not on file   Social Needs  . Financial resource strain: Not on file  . Food insecurity:    Worry: Not on file    Inability: Not on file  . Transportation needs:    Medical: Not on file    Non-medical: Not on file  Tobacco Use  . Smoking status: Never Smoker  . Smokeless tobacco: Never Used  Substance and Sexual Activity  . Alcohol use: No  . Drug use: No  . Sexual activity: Yes    Birth control/protection: None    Comment: last on 5/20  Lifestyle  . Physical activity:    Days per week: Not on file    Minutes per session: Not on file  . Stress: Not on file  Relationships  . Social connections:    Talks on phone: Not on file    Gets together: Not on file    Attends religious service: Not on file    Active member of club or organization: Not on file    Attends meetings of clubs or organizations: Not on file    Relationship status: Not on file  . Intimate partner violence:    Fear of current or ex partner: Not on file    Emotionally abused: Not on file  Physically abused: Not on file    Forced sexual activity: Not on file  Other Topics Concern  . Not on file  Social History Narrative   Lives with mom and 2 sisters.  Mom uses public transportation.  Does not have a car.   Patient in middle school.   Family History  Problem Relation Age of Onset  . Healthy Mother   . Healthy Father    No current facility-administered medications on file prior to encounter.    No current outpatient medications on file prior to encounter.   Allergies  Allergen Reactions  . Lactose Intolerance (Gi)   . Peanut-Containing Drug Products Itching    I have reviewed patient's Past Medical Hx, Surgical Hx, Family Hx, Social Hx, medications and allergies.   Review of Systems  Constitutional: Negative.   Gastrointestinal: Positive for nausea and vomiting. Negative for abdominal pain, constipation and diarrhea.  Genitourinary: Positive for flank pain and vaginal discharge. Negative for dysuria,  frequency, hematuria and vaginal bleeding.    OBJECTIVE Patient Vitals for the past 24 hrs:  BP Temp Temp src Pulse Resp SpO2 Height Weight  06/16/18 1154 112/68 - - (!) 106 - - - -  06/16/18 0846 109/65 98.5 F (36.9 C) Oral (!) 108 16 99 % - -  06/16/18 9242 - - - - - - 5\' 1"  (1.549 m) 48.1 kg   Constitutional: Well-developed, well-nourished female in no acute distress.  Cardiovascular: normal rate & rhythm, no murmur Respiratory: normal rate and effort. Lung sounds clear throughout GI: + right CVAT.  Abd soft, non-tender, Pos BS x 4. No guarding or rebound tenderness MS: Extremities nontender, no edema, normal ROM Neurologic: Alert and oriented x 4.      LAB RESULTS Results for orders placed or performed during the hospital encounter of 06/16/18 (from the past 24 hour(s))  Urinalysis, Routine w reflex microscopic     Status: Abnormal   Collection Time: 06/16/18  8:53 AM  Result Value Ref Range   Color, Urine AMBER (A) YELLOW   APPearance CLOUDY (A) CLEAR   Specific Gravity, Urine 1.024 1.005 - 1.030   pH 6.0 5.0 - 8.0   Glucose, UA NEGATIVE NEGATIVE mg/dL   Hgb urine dipstick LARGE (A) NEGATIVE   Bilirubin Urine NEGATIVE NEGATIVE   Ketones, ur 20 (A) NEGATIVE mg/dL   Protein, ur 100 (A) NEGATIVE mg/dL   Nitrite NEGATIVE NEGATIVE   Leukocytes,Ua MODERATE (A) NEGATIVE   RBC / HPF >50 (H) 0 - 5 RBC/hpf   WBC, UA >50 (H) 0 - 5 WBC/hpf   Bacteria, UA RARE (A) NONE SEEN   Squamous Epithelial / LPF 6-10 0 - 5   Mucus PRESENT    Non Squamous Epithelial 0-5 (A) NONE SEEN  Wet prep, genital     Status: Abnormal   Collection Time: 06/16/18 10:16 AM  Result Value Ref Range   Yeast Wet Prep HPF POC NONE SEEN NONE SEEN   Trich, Wet Prep NONE SEEN NONE SEEN   Clue Cells Wet Prep HPF POC NONE SEEN NONE SEEN   WBC, Wet Prep HPF POC MODERATE (A) NONE SEEN   Sperm NONE SEEN   CBC with Differential/Platelet     Status: Abnormal   Collection Time: 06/16/18 10:16 AM  Result Value Ref  Range   WBC 10.3 4.0 - 10.5 K/uL   RBC 3.90 3.87 - 5.11 MIL/uL   Hemoglobin 12.3 12.0 - 15.0 g/dL   HCT 34.5 (L) 36.0 - 46.0 %  MCV 88.5 80.0 - 100.0 fL   MCH 31.5 26.0 - 34.0 pg   MCHC 35.7 30.0 - 36.0 g/dL   RDW 11.6 11.5 - 15.5 %   Platelets 267 150 - 400 K/uL   nRBC 0.0 0.0 - 0.2 %   Neutrophils Relative % 75 %   Neutro Abs 7.8 (H) 1.7 - 7.7 K/uL   Lymphocytes Relative 12 %   Lymphs Abs 1.2 0.7 - 4.0 K/uL   Monocytes Relative 12 %   Monocytes Absolute 1.2 (H) 0.1 - 1.0 K/uL   Eosinophils Relative 0 %   Eosinophils Absolute 0.0 0.0 - 0.5 K/uL   Basophils Relative 0 %   Basophils Absolute 0.0 0.0 - 0.1 K/uL   Immature Granulocytes 1 %   Abs Immature Granulocytes 0.06 0.00 - 0.07 K/uL  Basic metabolic panel     Status: Abnormal   Collection Time: 06/16/18 10:16 AM  Result Value Ref Range   Sodium 130 (L) 135 - 145 mmol/L   Potassium 3.6 3.5 - 5.1 mmol/L   Chloride 97 (L) 98 - 111 mmol/L   CO2 22 22 - 32 mmol/L   Glucose, Bld 113 (H) 70 - 99 mg/dL   BUN 7 6 - 20 mg/dL   Creatinine, Ser 0.55 0.44 - 1.00 mg/dL   Calcium 9.8 8.9 - 10.3 mg/dL   GFR calc non Af Amer >60 >60 mL/min   GFR calc Af Amer >60 >60 mL/min   Anion gap 11 5 - 15    IMAGING No results found.  MAU COURSE Orders Placed This Encounter  Procedures  . Wet prep, genital  . Culture, OB Urine  . Urinalysis, Routine w reflex microscopic  . CBC with Differential/Platelet  . Basic metabolic panel  . Discharge patient   Meds ordered this encounter  Medications  . 0.9 %  sodium chloride infusion  . cefTRIAXone (ROCEPHIN) 2 g in sodium chloride 0.9 % 100 mL IVPB    Order Specific Question:   Antibiotic Indication:    Answer:   Other Indication (list below)    Order Specific Question:   Other Indication:    Answer:   Pyelonephritis in pregnancy  . metoCLOPramide (REGLAN) injection 10 mg  . metoCLOPramide (REGLAN) 10 MG tablet    Sig: Take 1 tablet (10 mg total) by mouth every 8 (eight) hours as needed  for nausea.    Dispense:  30 tablet    Refill:  0    Order Specific Question:   Supervising Provider    Answer:   Aletha Halim [id number]  . cephALEXin (KEFLEX) 500 MG capsule    Sig: Take 1 capsule (500 mg total) by mouth 4 (four) times daily.    Dispense:  28 capsule    Refill:  0    Order Specific Question:   Supervising Provider    Answer:   Aletha Halim [id number]  . Prenatal Vit-Fe Fumarate-FA (PREPLUS) 27-1 MG TABS    Sig: Take 1 tablet by mouth daily.    Dispense:  30 tablet    Refill:  1    Order Specific Question:   Supervising Provider    Answer:   Aletha Halim [id number]  . acetaminophen (TYLENOL) 325 MG tablet    Sig: Take 2 tablets (650 mg total) by mouth every 4 (four) hours as needed for up to 30 days.    Dispense:  100 tablet    Refill:  0    Order Specific Question:   Supervising Provider  Answer:   Aletha Halim [[id number]]    MDM U/a with moderate leuks & bacteria. Will send for culture. Pt has nausea & CVAT. Afebrile & no leukocytosis.  Given IV rocephin 2 gm in MAU. Tolerates POs, will send home with oral abx.   ASSESSMENT 1. Pyelonephritis affecting pregnancy in first trimester     PLAN Discharge home in stable condition. Discussed reasons to return to MAU or ED Rx keflex & reglan Urine culture pending  Allergies as of 06/16/2018      Reactions   Lactose Intolerance (gi)    Peanut-containing Drug Products Itching      Medication List    TAKE these medications   acetaminophen 325 MG tablet Commonly known as:  Tylenol Take 2 tablets (650 mg total) by mouth every 4 (four) hours as needed for up to 30 days.   cephALEXin 500 MG capsule Commonly known as:  KEFLEX Take 1 capsule (500 mg total) by mouth 4 (four) times daily.   metoCLOPramide 10 MG tablet Commonly known as:  REGLAN Take 1 tablet (10 mg total) by mouth every 8 (eight) hours as needed for nausea.   PrePLUS 27-1 MG Tabs Take 1 tablet by mouth daily.         Jorje Guild, NP 06/16/2018  12:29 PM

## 2018-06-16 NOTE — ED Notes (Signed)
Pt. Stated, Im [redacted] weeks pregnant and feel nauseated with back pain, and feeling dizzy. Called report to [place], RN Transported via wheelchair to MAU.

## 2018-06-18 LAB — CULTURE, OB URINE: Culture: >=100000 — AB

## 2018-06-20 ENCOUNTER — Telehealth: Payer: Self-pay

## 2018-06-20 NOTE — Telephone Encounter (Signed)
Called pt in regards to her MyChart message that she was worried.  I explained to the pt to not worry that we need to verify the location of pregnancy and that is the reason for the [place].  Pt denies any bleeding or pain.  Pt sounded reassured and stated that she would be going to her [place] appt.

## 2018-06-21 ENCOUNTER — Ambulatory Visit (HOSPITAL_COMMUNITY)
Admission: RE | Admit: 2018-06-21 | Discharge: 2018-06-21 | Disposition: A | Discharge status: Home / Self Care | Payer: Medicaid Other | Source: Ambulatory Visit | Attending: Obstetrics & Gynecology | Admitting: Obstetrics & Gynecology

## 2018-06-21 ENCOUNTER — Other Ambulatory Visit: Payer: Self-pay

## 2018-06-21 DIAGNOSIS — O3680X0 Pregnancy with inconclusive fetal viability, not applicable or unspecified: Secondary | ICD-10-CM | POA: Diagnosis not present

## 2018-06-21 IMAGING — US TRANSVAGINAL OB ULTRASOUND
1 series · 15 of 28 positions shown · non-contrast
Comparison: [DATE]

CLINICAL DATA: Pregnancy of inconclusive fetal viability

EXAM:
TRANSVAGINAL OB ULTRASOUND
TECHNIQUE: Transvaginal ultrasound was performed for complete evaluation of the
gestation as well as the maternal uterus, adnexal regions, and
pelvic cul-de-sac.

[Series 1: transvaginal ob ultrasound · 15 of 35 slices shown]
[im 1/35]
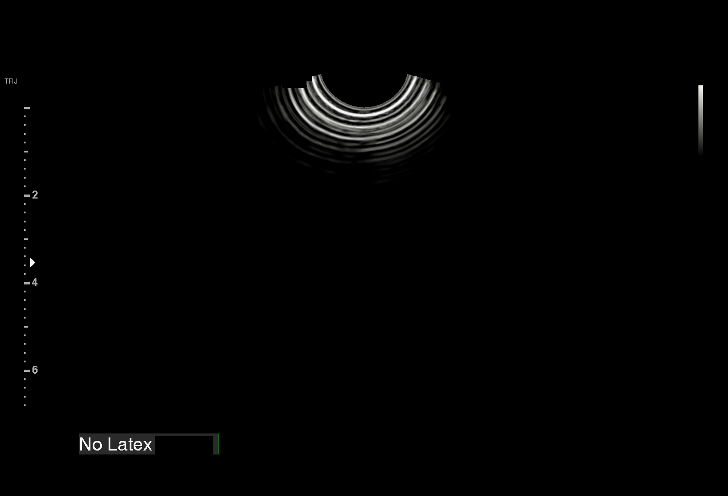
[im 3/35]
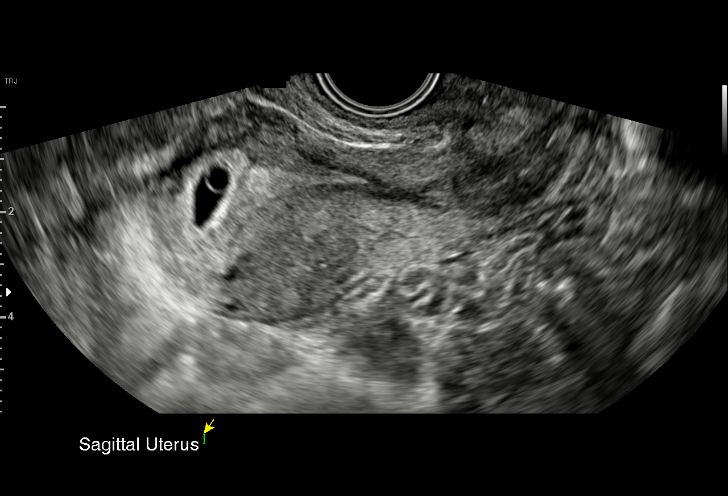
[im 6/35]
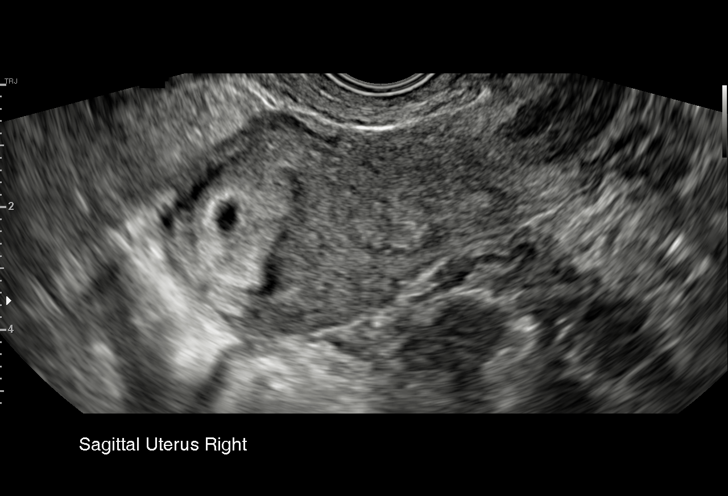
[im 8/35]
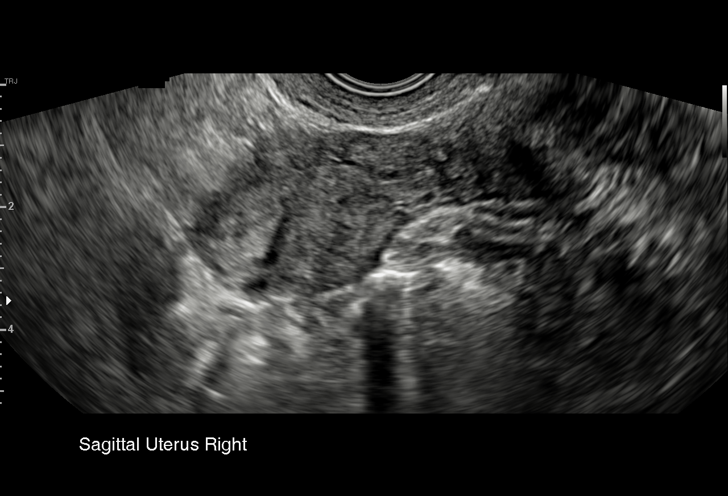
[im 11/35]
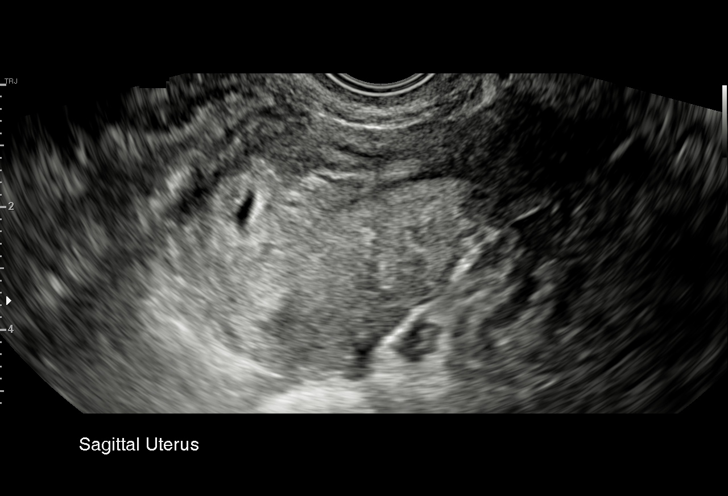
[im 13/35]
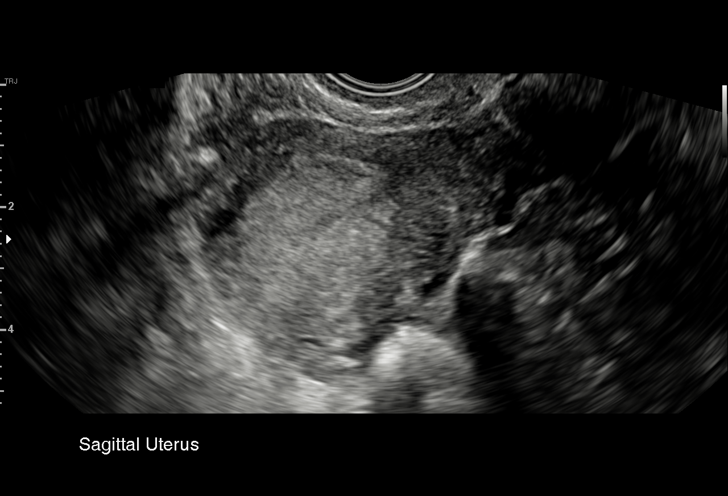
[im 16/35]
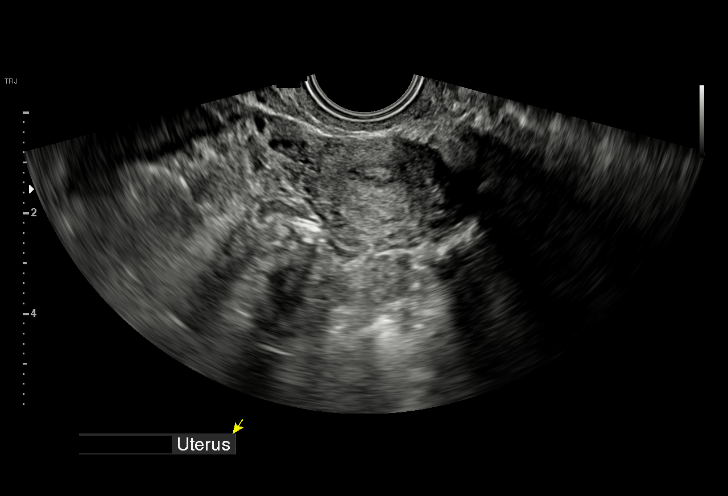
[im 18/35]
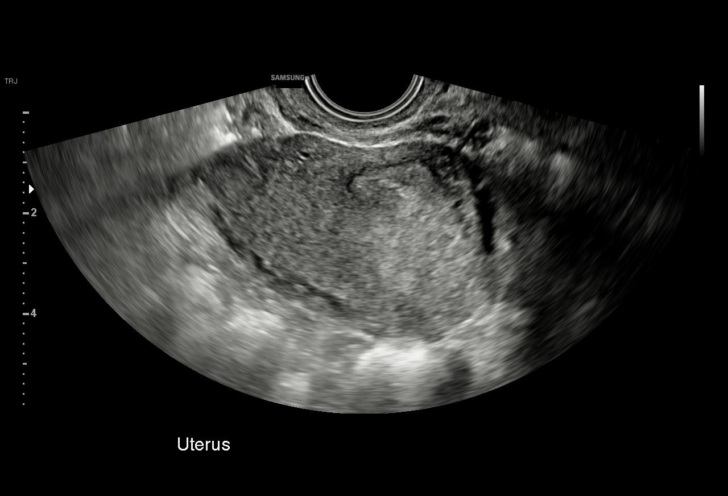
[im 19/35]
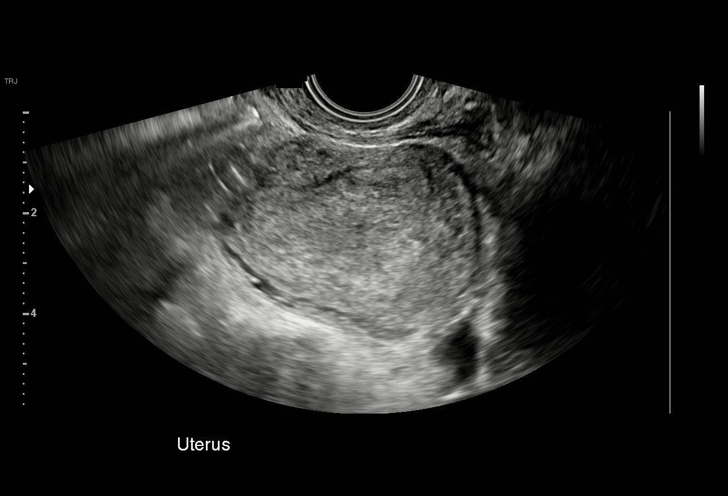
[im 22/35]
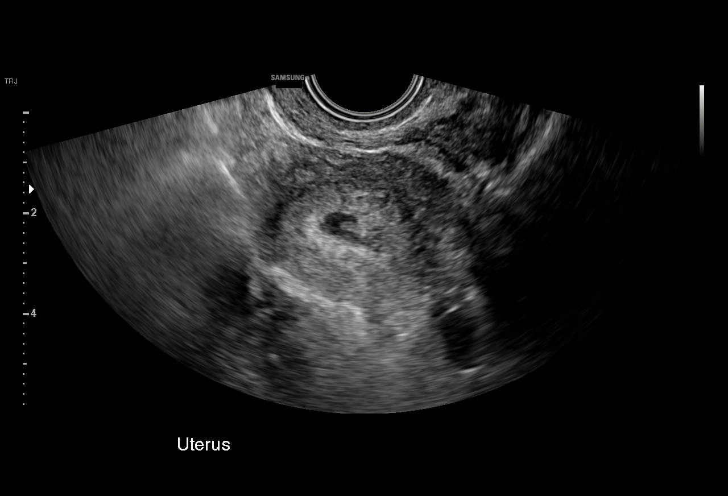
[im 24/35]
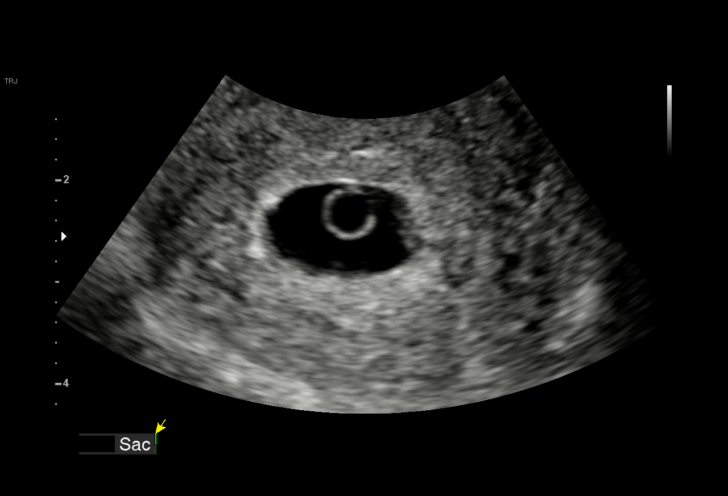
[im 27/35]
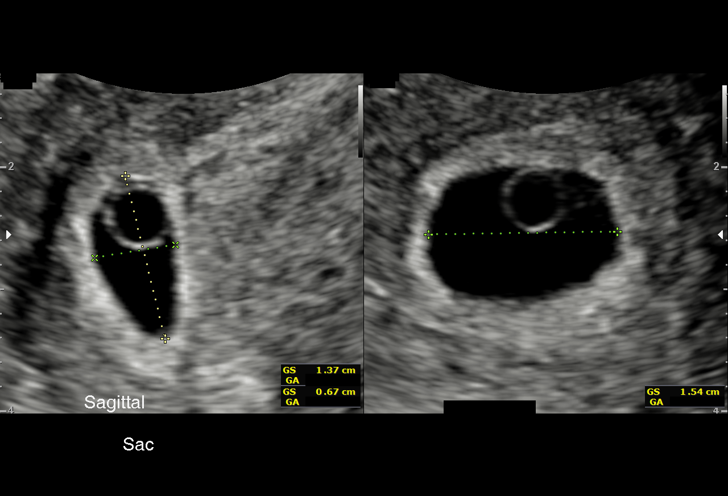
[im 29/35]
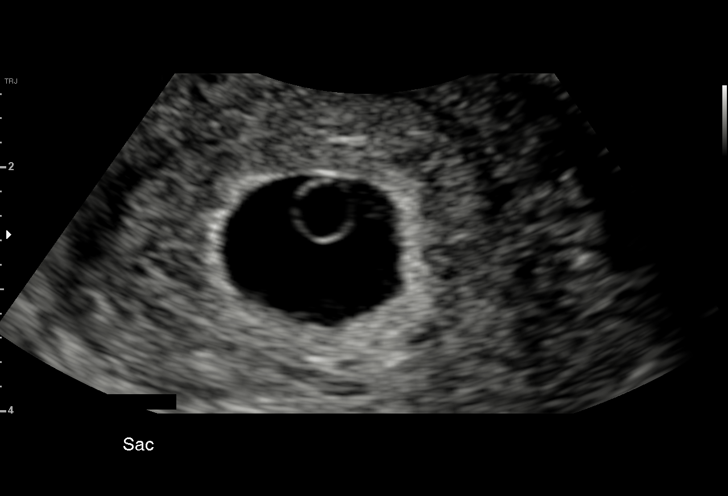
[im 32/35]
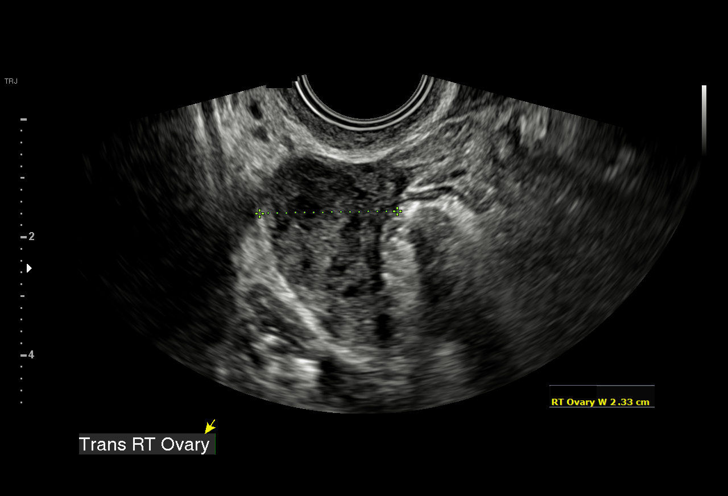
[im 35/35]
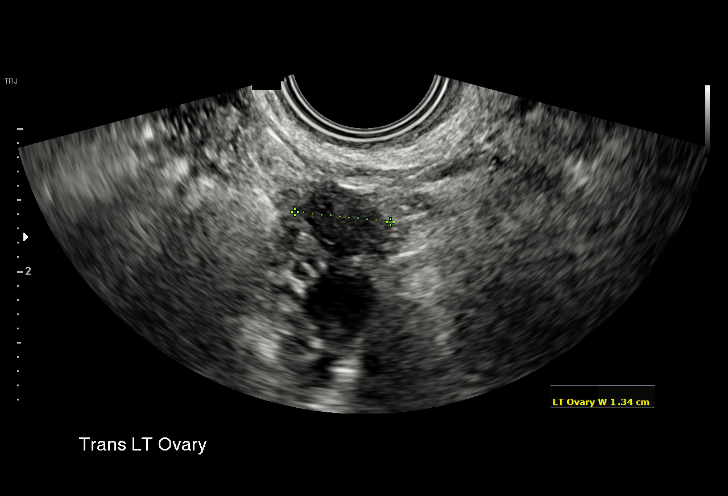

[15 of 28 positions shown; findings below may reference images not displayed]

FINDINGS: Intrauterine gestational sac: Present, single

Yolk sac:  Present

Embryo:  Not visualized

Cardiac Activity: N/A

Heart Rate: N/A bpm

MSD: 11.9 mm   5 w   6 d

Subchorionic hemorrhage:  None visualized.

Maternal uterus/adnexae:

Uterus otherwise unremarkable.

LEFT ovary normal size and morphology 1.8 x 0.8 x 1.3 cm.

RIGHT ovary measures 3.5 x 3.4 x 2.3 cm, contains a small
hemorrhagic corpus luteum.

No free pelvic fluid or adnexal masses.
IMPRESSION: Gestational sac is seen within the uterus containing a yolk sac but
no fetal pole is visualized.

Findings are suspicious but not yet definitive for failed pregnancy.
Recommend follow-up US in 10-14 days for definitive diagnosis. This
recommendation follows SRU consensus guidelines: Diagnostic Criteria
for Nonviable Pregnancy Early in the First Trimester. N Engl J Med

## 2018-06-25 ENCOUNTER — Encounter: Payer: Self-pay | Admitting: Emergency Medicine

## 2018-06-25 ENCOUNTER — Telehealth: Payer: Self-pay | Admitting: Emergency Medicine

## 2018-06-25 DIAGNOSIS — O3680X0 Pregnancy with inconclusive fetal viability, not applicable or unspecified: Secondary | ICD-10-CM

## 2018-06-25 NOTE — Telephone Encounter (Signed)
-----   Message from Emily Filbert, MD sent at 06/22/2018 11:36 AM EDT ----- She will need a follow up u/s in a week to determine viability. Thanks

## 2018-06-25 NOTE — Telephone Encounter (Signed)
Pt was called and voicemail was left with ultrasound appointment details. Mychart message sent regarding ultrasound appointment as well. Pt advised to call with any questions or concerns.

## 2018-07-03 ENCOUNTER — Other Ambulatory Visit: Payer: Self-pay

## 2018-07-03 ENCOUNTER — Ambulatory Visit (HOSPITAL_COMMUNITY)
Admission: RE | Admit: 2018-07-03 | Discharge: 2018-07-03 | Disposition: A | Discharge status: Home / Self Care | Payer: Medicaid Other | Source: Ambulatory Visit | Attending: Obstetrics & Gynecology | Admitting: Obstetrics & Gynecology

## 2018-07-03 DIAGNOSIS — O3680X0 Pregnancy with inconclusive fetal viability, not applicable or unspecified: Secondary | ICD-10-CM | POA: Diagnosis not present

## 2018-07-27 ENCOUNTER — Telehealth: Payer: Self-pay

## 2018-07-27 NOTE — Telephone Encounter (Signed)
Patient called and needs a referal from this office due to her being pregnant and her Medicaid card still has this office name on it and she is no longer a patient here. Please call patient so that she can know the next steps to get the care she needs.

## 2018-07-30 NOTE — Telephone Encounter (Signed)
TC to patient. Phone rang once and went to VM. Stated in the VM that since we are no longer PCP (patient dismissed from [facility]) we are unable to enter a referral. Regarding the medicaid concern, patient will need to reach out to medicaid to let them know who is has established with for primary care so they can update the insurance card. Left main office number in case she calls back with additional questions.

## 2018-07-30 NOTE — Telephone Encounter (Signed)
Please see note on your patient.  Thanks.

## 2018-08-01 ENCOUNTER — Other Ambulatory Visit: Payer: Self-pay

## 2018-08-01 ENCOUNTER — Telehealth: Payer: Medicaid Other | Admitting: *Deleted

## 2018-08-01 DIAGNOSIS — O99519 Diseases of the respiratory system complicating pregnancy, unspecified trimester: Secondary | ICD-10-CM

## 2018-08-01 DIAGNOSIS — O0990 Supervision of high risk pregnancy, unspecified, unspecified trimester: Secondary | ICD-10-CM | POA: Insufficient documentation

## 2018-08-01 DIAGNOSIS — J45909 Unspecified asthma, uncomplicated: Secondary | ICD-10-CM

## 2018-08-01 DIAGNOSIS — I639 Cerebral infarction, unspecified: Secondary | ICD-10-CM

## 2018-08-01 MED ORDER — AMBULATORY NON FORMULARY MEDICATION: 1.0000 | 0 refills | Status: DC
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
## 2018-08-03 NOTE — Progress Notes (Signed)
Patient seen and assessed by nursing staff during this encounter. I have reviewed the chart and agree with the documentation and plan.  Aletha Halim, MD 08/03/2018 11:17 AM

## 2018-08-06 ENCOUNTER — Telehealth: Payer: Self-pay | Admitting: Licensed Clinical Social Worker

## 2018-08-06 NOTE — Telephone Encounter (Signed)
Education given regarding options for contraception, including nexplanon and pp iud. Patient reports history using depo injections. Patient inquire about bp cuff, patient advised bp cuff should arrive shortly in white box from [place] supply mailed to her home address.

## 2018-08-06 NOTE — Telephone Encounter (Signed)
Left message to complete initial contraception counseling

## 2018-08-15 ENCOUNTER — Encounter: Payer: Self-pay | Admitting: Obstetrics and Gynecology

## 2018-08-15 ENCOUNTER — Ambulatory Visit (INDEPENDENT_AMBULATORY_CARE_PROVIDER_SITE_OTHER): Payer: Medicaid Other | Admitting: Obstetrics and Gynecology

## 2018-08-15 ENCOUNTER — Ambulatory Visit: Payer: Medicaid Other | Admitting: Clinical

## 2018-08-15 ENCOUNTER — Other Ambulatory Visit: Payer: Self-pay

## 2018-08-15 ENCOUNTER — Other Ambulatory Visit (HOSPITAL_COMMUNITY)
Admission: RE | Admit: 2018-08-15 | Discharge: 2018-08-15 | Disposition: A | Discharge status: Home / Self Care | Payer: Medicaid Other | Source: Ambulatory Visit | Attending: Obstetrics and Gynecology | Admitting: Obstetrics and Gynecology

## 2018-08-15 VITALS — BP 117/66 | HR 84 | Temp 98.2°F | Wt 108.8 lb

## 2018-08-15 DIAGNOSIS — I639 Cerebral infarction, unspecified: Secondary | ICD-10-CM

## 2018-08-15 DIAGNOSIS — O9989 Other specified diseases and conditions complicating pregnancy, childbirth and the puerperium: Secondary | ICD-10-CM

## 2018-08-15 DIAGNOSIS — O99019 Anemia complicating pregnancy, unspecified trimester: Secondary | ICD-10-CM

## 2018-08-15 DIAGNOSIS — O99411 Diseases of the circulatory system complicating pregnancy, first trimester: Secondary | ICD-10-CM

## 2018-08-15 DIAGNOSIS — O0990 Supervision of high risk pregnancy, unspecified, unspecified trimester: Secondary | ICD-10-CM

## 2018-08-15 DIAGNOSIS — F419 Anxiety disorder, unspecified: Secondary | ICD-10-CM

## 2018-08-15 DIAGNOSIS — O99341 Other mental disorders complicating pregnancy, first trimester: Secondary | ICD-10-CM

## 2018-08-15 DIAGNOSIS — O99011 Anemia complicating pregnancy, first trimester: Secondary | ICD-10-CM

## 2018-08-15 DIAGNOSIS — O0991 Supervision of high risk pregnancy, unspecified, first trimester: Secondary | ICD-10-CM

## 2018-08-15 DIAGNOSIS — F329 Major depressive disorder, single episode, unspecified: Secondary | ICD-10-CM

## 2018-08-15 DIAGNOSIS — Z3A13 13 weeks gestation of pregnancy: Secondary | ICD-10-CM

## 2018-08-15 NOTE — BH Specialist Note (Signed)
Integrated Behavioral Health Initial Visit  MRN: [id number] Name: Tina Gibbs  Number of [place] Clinician visits:: 1/6 Session Start time: 10:42  Session End time: 10:56 Total time: 15 minutes  Type of Service: [place] Interpretor:No. Interpretor Name and Language: n/a   Warm Hand Off Completed.       SUBJECTIVE: Tina Gibbs is a 18 y.o. female accompanied by n/a Patient was referred by Aletha Halim, MD for new ob introduction. Patient reports the following symptoms/concerns: Pt states her primary concern today is worry over not having as much "morning sickness" as pregnant friends; no other concern at this time.  Duration of problem: Current pregnancy; Severity of problem: mild  OBJECTIVE: Mood: Normal and Affect: Appropriate Risk of harm to self or others: No plan to harm self or others  LIFE CONTEXT: Family and Social: - School/Work: - Self-Care: - Life Changes: Current pregnancy  GOALS ADDRESSED: Patient will: 1. Increase knowledge and/or ability of: healthy habits  2. Demonstrate ability to: Increase healthy adjustment to current life circumstances  INTERVENTIONS: Interventions utilized: Psychoeducation and/or Health Education  Standardized Assessments completed: Not Needed  ASSESSMENT: Patient currently experiencing Supervision of high risk pregnancy, antepartum   Patient may benefit from Initial OB introduction to integrated behavioral health services .  PLAN: 1. Follow up with behavioral health clinician on : As needed 2. Behavioral recommendations:  -Continue taking prenatal vitamin, as recommended by medical provider 3. Referral(s): [place] (In Clinic) 4. "From scale of 1-10, how likely are you to follow plan?": [place], LCSW
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
## 2018-08-16 LAB — OBSTETRIC PANEL, INCLUDING HIV
Antibody Screen: NEGATIVE
Basophils Absolute: 0.1 10*3/uL (ref 0.0–0.2)
Basos: 1 %
EOS (ABSOLUTE): 0.2 10*3/uL (ref 0.0–0.4)
Eos: 3 %
HIV Screen 4th Generation wRfx: NONREACTIVE
Hematocrit: 34.4 % (ref 34.0–46.6)
Hemoglobin: 12.3 g/dL (ref 11.1–15.9)
Hepatitis B Surface Ag: NEGATIVE
Immature Grans (Abs): 0 10*3/uL (ref 0.0–0.1)
Immature Granulocytes: 0 %
Lymphocytes Absolute: 2.1 10*3/uL (ref 0.7–3.1)
Lymphs: 26 %
MCH: 33.4 pg — ABNORMAL HIGH (ref 26.6–33.0)
MCHC: 35.8 g/dL — ABNORMAL HIGH (ref 31.5–35.7)
MCV: 94 fL (ref 79–97)
Monocytes Absolute: 0.3 10*3/uL (ref 0.1–0.9)
Monocytes: 4 %
Neutrophils Absolute: 5.1 10*3/uL (ref 1.4–7.0)
Neutrophils: 66 %
Platelets: 241 10*3/uL (ref 150–450)
RBC: 3.68 x10E6/uL — ABNORMAL LOW (ref 3.77–5.28)
RDW: 12.5 % (ref 11.7–15.4)
RPR Ser Ql: NONREACTIVE
Rh Factor: POSITIVE
Rubella Antibodies, IGG: 1.59 index (ref >0.99)
WBC: 7.8 10*3/uL (ref 3.4–10.8)

## 2018-08-16 LAB — GC/CHLAMYDIA PROBE AMP (~~LOC~~) NOT AT ARMC
Chlamydia: NEGATIVE
Neisseria Gonorrhea: NEGATIVE

## 2018-08-17 ENCOUNTER — Encounter: Payer: Self-pay | Admitting: *Deleted

## 2018-08-21 LAB — URINE CULTURE, OB REFLEX

## 2018-08-21 LAB — CULTURE, OB URINE

## 2018-08-22 ENCOUNTER — Other Ambulatory Visit: Payer: Self-pay | Admitting: Obstetrics and Gynecology

## 2018-08-22 ENCOUNTER — Encounter: Payer: Self-pay | Admitting: *Deleted

## 2018-08-22 ENCOUNTER — Encounter: Payer: Self-pay | Admitting: Obstetrics and Gynecology

## 2018-08-22 DIAGNOSIS — B951 Streptococcus, group B, as the cause of diseases classified elsewhere: Secondary | ICD-10-CM | POA: Insufficient documentation

## 2018-08-22 DIAGNOSIS — O2340 Unspecified infection of urinary tract in pregnancy, unspecified trimester: Secondary | ICD-10-CM | POA: Insufficient documentation

## 2018-08-22 MED ORDER — SULFAMETHOXAZOLE-TRIMETHOPRIM 800-160 MG PO TABS: 1.0000 | ORAL_TABLET | Freq: Two times a day (BID) | ORAL | 0 refills | Status: AC

## 2018-09-04 ENCOUNTER — Telehealth: Payer: Self-pay | Admitting: Medical

## 2018-09-04 NOTE — Telephone Encounter (Signed)
Called the patient to inform of the upcoming mychart visit. The patient is aware of how to log in and agreed to the attend the appointment.

## 2018-09-05 ENCOUNTER — Encounter: Payer: Self-pay | Admitting: Medical

## 2018-09-05 ENCOUNTER — Telehealth (INDEPENDENT_AMBULATORY_CARE_PROVIDER_SITE_OTHER): Payer: Medicaid Other | Admitting: Medical

## 2018-09-05 ENCOUNTER — Other Ambulatory Visit: Payer: Self-pay

## 2018-09-05 DIAGNOSIS — O0990 Supervision of high risk pregnancy, unspecified, unspecified trimester: Secondary | ICD-10-CM

## 2018-09-05 DIAGNOSIS — Z3A16 16 weeks gestation of pregnancy: Secondary | ICD-10-CM

## 2018-09-05 DIAGNOSIS — O0992 Supervision of high risk pregnancy, unspecified, second trimester: Secondary | ICD-10-CM

## 2018-09-05 MED ORDER — BLOOD PRESSURE MONITORING DEVI: 1.0000 | 0 refills | Status: DC

## 2018-09-05 NOTE — Progress Notes (Signed)
I connected with Tina Gibbs  on 09/05/18 at  2:15 PM EDT by: MyChart and verified that I am speaking with the correct person using two identifiers.  Patient is located at home and provider is located at [facility].     The purpose of this virtual visit is to provide medical care while limiting exposure to the novel coronavirus. I discussed the limitations, risks, security and privacy concerns of performing an evaluation and management service by MyChart and the availability of in person appointments. I also discussed with the patient that there may be a patient responsible charge related to this service. By engaging in this virtual visit, you consent to the provision of healthcare.  Additionally, you authorize for your insurance to be billed for the services provided during this visit.  The patient expressed understanding and agreed to proceed.  The following staff members participated in the virtual visit:  Carver Fila, [place] VISIT NOTE  Subjective:  Tina Gibbs is a 18 y.o. G1P0 at [redacted]w[redacted]d  for phone visit for ongoing prenatal care.  She is currently monitored for the following issues for this low-risk pregnancy and has Unspecified asthma(493.90); Seasonal allergies; ADHD (attention deficit hyperactivity disorder); Influenza vaccination declined; Supervision of high risk pregnancy, antepartum; Asthma; Anxiety and depression; UTI in pregnancy; and GBS (group B streptococcus) UTI complicating pregnancy on their problem list.  Patient reports occasional contractions.  Contractions: Not present. Vag. Bleeding: None.  Movement: Present. Denies leaking of fluid.   The following portions of the patient's history were reviewed and updated as appropriate: allergies, current medications, past family history, past medical history, past social history, past surgical history and problem list.   Objective:  There were no vitals filed for this visit. Self-Obtained  Fetal Status:     Movement:  Present     Assessment and Plan:  Pregnancy: G1P0 at [redacted]w[redacted]d 1. Supervision of high risk pregnancy, antepartum - Doing well - Discussed use of abdominal binder, Tylenol PRN and hydrotherapy if needed for abdominal pain  - Patient had not received BP cuff yet, so it was re-ordered  - Blood Pressure Monitoring DEVI; 1 Device by Does not apply route once a week. ICD 10: O67 O09.90  Dispense: 1 Device; Refill: 0  Preterm labor/ second trimester warning symptoms and general obstetric precautions including but not limited to vaginal bleeding, contractions, leaking of fluid and fetal movement were reviewed in detail with the patient.  Return in about 4 weeks (around 10/03/2018) for LOB, Virtual.  Future Appointments  Date Time Provider [place]  09/20/2018 10:00 AM [place] [facility]-US  09/20/2018 10:00 AM [facility]-MFC [place] 3 WH-MFCUS MFC-US     Time spent on virtual visit: 10 minutes  Kerry Hough, PA-C

## 2018-09-05 NOTE — Patient Instructions (Addendum)
Childbirth Education Options: [facility] Department Classes:  Childbirth education classes can help you get ready for a positive parenting experience. You can also meet other expectant parents and get free stuff for your baby. Each class runs for five weeks on the same night and costs $45 for the mother-to-be and her support person. Medicaid covers the cost if you are eligible. Call [phone] to register. [facility] Childbirth Education:  [phone] or [phone] or [email or web address]  Baby & Me Class: Discuss newborn & infant parenting and family adjustment issues with other new mothers in a relaxed environment. Each week brings a new speaker or baby-centered activity. We encourage new mothers to join [place] every Thursday at 11:00am. Babies birth until crawling. No registration or fee. Daddy [employer]: This course offers Dads-to-be the tools and knowledge needed to feel confident on their journey to becoming new fathers. Experienced dads, who have been trained as coaches, teach dads-to-be how to hold, comfort, diaper, swaddle and play with their infant while being able to support the new mom as well. A class for men taught by men. $25/dad Big Brother/Big Sister: Let your children share in the joy of a new brother or sister in this special class designed just for them. Class includes discussion about how families care for babies: swaddling, holding, diapering, safety as well as how they can be helpful in their new role. This class is designed for children ages 41 to 76, but any age is welcome. Please register each child individually. $5/child  Mom Talk: This mom-led group offers support and connection to mothers as they journey through the adjustments and struggles of that sometimes overwhelming first year after the birth of a child. Tuesdays at 10:00am and Thursdays at 6:00pm. Babies welcome. No registration or fee. Breastfeeding Support Group: This group is a mother-to-mother  support circle where moms have the opportunity to share their breastfeeding experiences. A Lactation Consultant is present for questions and concerns. Meets each Tuesday at 11:00am. No fee or registration. Breastfeeding Your Baby: Learn what to expect in the first days of breastfeeding your newborn.  This class will help you feel more confident with the skills needed to begin your breastfeeding experience. Many new mothers are concerned about breastfeeding after leaving the hospital. This class will also address the most common fears and challenges about breastfeeding during the first few weeks, months and beyond. (call for fee) Comfort Techniques and Tour: This 2 hour interactive class will provide you the opportunity to learn & practice hands-on techniques that can help relieve some of the discomfort of labor and encourage your baby to rotate toward the best position for birth. You and your partner will be able to try a variety of labor positions with birth balls and rebozos as well as practice breathing, relaxation, and visualization techniques. A tour of the [facility] is included with this class. $20 per registrant and support person Childbirth Class- Weekend Option: This class is a Weekend version of our Birth & Baby series. It is designed for parents who have a difficult time fitting several weeks of classes into their schedule. It covers the care of your newborn and the basics of labor and childbirth. It also includes a [facility] and lunch. The class is held two consecutive days: beginning on Friday evening from 6:30 - 8:30 p.m. and the next day, Saturday from 9 a.m. - 4 p.m. (call for fee) Doren Custard Class: Interested in a waterbirth?  This  informational class will help you discover whether waterbirth is the right fit for you. Education about waterbirth itself, supplies you would need and how to assemble your support team is what you can  expect from this class. Some obstetrical practices require this class in order to pursue a waterbirth. (Not all obstetrical practices offer waterbirth-check with your healthcare provider.) Register only the expectant mom, but you are encouraged to bring your partner to class! Required if planning waterbirth, no fee. °Infant/Child CPR: Parents, grandparents, babysitters, and friends learn Cardio-Pulmonary Resuscitation skills for infants and children. You will also learn how to treat both conscious and unconscious choking in infants and children. This Family & Friends program does not offer certification. Register each participant individually to ensure that enough mannequins are available. (Call for fee) °Grandparent Love: Expecting a grandbaby? This class is for you! Learn about the latest infant care and safety recommendations and ways to support your own child as he or she transitions into the parenting role. Taught by Registered Nurses who are childbirth instructors, but most importantly...they are grandmothers too! $10/person. °Childbirth Class- Natural Childbirth: This series of 5 weekly classes is for expectant parents who want to learn and practice natural methods of coping with the process of labor and childbirth. Relaxation, breathing, massage, visualization, role of the partner, and helpful positioning are highlighted. Participants learn how to be confident in their body's ability to give birth. This class will empower and help parents make informed decisions about their own care. Includes discussion that will help new parents transition into the immediate postpartum period. Maternity Care Center Tour of [facility] is included. We suggest taking this class between 25-32 weeks, but it's only a recommendation. $75 per registrant and one support person or $30 Medicaid. °Childbirth Class- 3 week Series: This option of 3 weekly classes helps you and your labor partner prepare for childbirth. Newborn  care, labor & birth, cesarean birth, pain management, and comfort techniques are discussed and a Maternity Care Center Tour of [facility] is included. The class meets at the same time, on the same day of the week for 3 consecutive weeks beginning with the starting date you choose. $60 for registrant and one support person.  °Marvelous Multiples: Expecting twins, triplets, or more? This class covers the differences in labor, birth, parenting, and breastfeeding issues that face multiples’ parents. NICU tour is included. Led by a Certified Childbirth Educator who is the mother of twins. No fee. °Caring for Baby: This class is for expectant and adoptive parents who want to learn and practice the most up-to-date newborn care for their babies. Focus is on birth through the first six weeks of life. Topics include feeding, bathing, diapering, crying, umbilical cord care, circumcision care and safe sleep. Parents learn to recognize symptoms of illness and when to call the pediatrician. Register only the mom-to-be and your partner or support person can plan to come with you! $10 per registrant and support person °Childbirth Class- online option: This online class offers you the freedom to complete a Birth and Baby series in the comfort of your own home. The flexibility of this option allows you to review sections at your own pace, at times convenient to you and your support people. It includes additional video information, animations, quizzes, and extended activities. Get organized with helpful eClass tools, checklists, and trackers. Once you register online for the class, you will receive an email within a few days to accept the invitation and begin the class when the time   is right for you. The content will be available to you for 60 days. $60 for 60 days of online access for you and your support people. ° °Local Doulas: °Natural Baby Doulas °[email or web address] °Tel:  336-267-5879 °https://www.[email or web address]/ °[facility] Doulas °336-448-4114 °[email or web address] °www.[email or web address].com °The Labor Ladies  °(also do waterbirth tub rental) °336-515-0240 °[email or web address] °https://www.thelaborladies.[email or web address]/ °Triad Birth Doula °336-312-4678 °[email or web address] °http://www.[email or web address]/ °Sacred Rhythms  °336-239-2124 °https://sacred-rhythms.com/ °[place] [facility] Doula Association (PADA) °[email or web address] °http://www.padanc.org/index.htm °[place] Birth and Baby  °http://[email or web address]/ °Considering Waterbirth? °Guide for [facility] at [facility] ° °Why consider waterbirth? ° °• Gentle birth for babies °• Less pain medicine used in labor °• May allow for passive descent/less pushing °• May reduce perineal tears  °• More mobility and instinctive maternal position changes °• Increased maternal relaxation °• Reduced blood pressure in labor ° °Is waterbirth safe? What are the risks of infection, drowning or other complications? ° °• Infection: °o Very low risk (3.7 % for tub vs 4.8% for bed) °o 7 in 8000 waterbirths with documented infection °o Poorly cleaned equipment most common cause °o Slightly lower group B strep transmission rate ° °• Drowning °o Maternal:  °- Very low risk   °- Related to seizures or fainting °o Newborn:  °- Very low risk. No evidence of increased risk of respiratory problems in multiple large studies °- Physiological protection from breathing under water °- Avoid underwater birth if there are any fetal complications °- Once baby’s head is out of the water, keep it out. ° °• Birth complication °o Some reports of cord trauma, but risk decreased by bringing baby to surface gradually °o No evidence of increased risk of shoulder dystocia. Mothers can usually change positions faster in water than in a bed, possibly aiding the maneuvers to free the shoulder. ° ° °You must attend a [facility] class [facility] °· 3rd Wednesday of every month from 7-9pm °· Free °· Register by calling [phone] or online at [email or web address] °· Bring us the certificate from the class to your prenatal appointment ° °Meet with a midwife at 36 weeks to see if you can still plan a waterbirth and to sign the consent.  ° °Purchase or rent the following supplies:  °· Water Birth Pool (Birth Pool in a Box or [place]ine for instance)  (Tubs start ~$125) °· Single-use disposable tub liner designed for your brand of tub °· New garden hose labeled "lead-free", “suitable for drinking water", °· Electric drain pump to remove water (We recommend 792 gallon per hour or greater pump.)  °· Separate garden hose to remove the dirty water °· Fish net °· Bathing suit top (optional) °· Long-handled mirror (optional) ° °Places to purchase or rent supplies °· Yourwaterbirth.com for tub purchases and supplies °· Waterbirthsolutions.com for tub purchases and supplies °· The Labor Ladies (www.thelaborladies.com) $275 for tub rental/set-up & take down/kit  °· [place] [employer]oul[employer] Association (http://www.padanc.org/MeetUs.htm) Information regarding doulas (labor support) who provide pool rentals °· Our practice has a Birth Pool in a Box tub at the hospital that you may borrow on a first-come-first-served basis. It is your responsibility to to set up, clean and break down the tub. We cannot guarantee the availability of this tub in advance. You are responsible for bringing all accessories listed above. If you do not have all necessary supplies you cannot have a waterbirth.   ° °Things that would prevent you from having a waterbirth: °· Premature, <37wks °· Previous cesarean birth °· Presence of thick meconium-stained fluid °· Multiple gestation (Twins,   triplets, etc.)  Uncontrolled diabetes or gestational diabetes requiring medication  Hypertension requiring medication or diagnosis of pre-eclampsia  Heavy vaginal bleeding  Non-reassuring fetal  heart rate  Active infection (MRSA, etc.). Group B Strep is NOT a contraindication for  waterbirth.  If your labor has to be induced and induction method requires continuous  monitoring of the baby's heart rate  Other risks/issues identified by your obstetrical provider  Please remember that birth is unpredictable. Under certain unforeseeable circumstances your provider may advise against giving birth in the tub. These decisions will be made on a case-by-case basis and with the safety of you and your baby as our highest priority.   AREA PEDIATRIC/FAMILY PRACTICE PHYSICIANS  Central/[place]outheast [place] [phone])  [facility] Davy Pique, MD; Gwendlyn Deutscher, MD; Walker Kehr, MD; Andria Frames, MD; McDiarmid, MD; Dutch Quint, MD; Nori Riis, MD; Mingo Amber, [address] [place], [place] [address] o [phone] o Mon-Fri 8:30-12:30, 1:30-5:00 o Providers come to see babies at [place] [facility] [place] providers who accept newborns: Dorthy Cooler, MD; Orland Mustard, MD; Stephanie Acre, MD o [place], [place], [place] [address] o [phone] o Mon-Fri 8:00-5:30 o Babies seen by providers at [facility] o Does NOT accept Medicaid o Please call early in hospitalization for appointment (limited availability)   [place], MD o [address] [place], [place] [address] o [phone] o Mon, Tue, Thur, Fri 8:30-5:00, Wed 10:00-7:00 (closed 1-2pm) o Babies seen by [facility] providers o Accepting Medicaid  [place], MD o [place], [place], [place] [address] o [phone] o Mon-Fri 8:30-5:00, Sat 8:30-12:00 o Provider comes to see babies at [facility] o Accepting Medicaid o Must have been referred from current patients or contacted office prior to delivery  [place] [facility] ([place] for  [place]) Franne Forts, MD; Tamera Punt, MD; Doneen Poisson, MD; Fatima Sanger, MD; Wynetta Emery, MD; Jess Barters, MD; Tami Ribas, MD; Herbert Moors, MD; Derrell Lolling, MD; Dorothyann Peng, MD; Lucious Groves, NP; Baldo Ash, NP o [place], [place], [place] [address] o [phone] o Mon, Tue, Thur, Fri 8:30-5:30, Wed 9:30-5:30, Sat 8:30-12:30 o Babies seen by [facility] providers o Accepting Medicaid o Only accepting infants of first-time parents or siblings of current patients o Hospital discharge coordinator will make follow-up appointment  Baltazar Najjar o [address], [place], [place]  [address] o [phone]   Fax - [phone]  [facility] o [address], [place], [place]  [address] o Phone - [phone]   Fax - [place] o [address] [place], [place], [place]  [address] o [phone]  [place] [phone])  [facility] Reginal Lutes, MD; Jacklynn Ganong, MD; Torrie Mayers, MD; MD; Rosana Hoes, MD; Servando [place], MD; Rose Fillers, MD; Rex Kras, MD; Corinna Capra, MD; Volney American, MD; Trilby Drummer, MD; Janann Colonel, MD; Jimmye Norman, [place], [place], [place] [address] o [phone] o Mon-Fri 8:30-5:00 (extended evenings Mon-Thur as needed), Sat-Sun 10:00-1:00 o Providers come to see babies at [place] Medicaid for families of first-time babies and families with all children in the household age 32 and under. Must register with office prior to making appointment (M-F only).  [place], NP; Tomi Bamberger, MD; Redmond School, MD; [place], [address] [place], [place] [address] o [phone] o Mon-Fri 8:00-5:00 o Babies seen by providers at [facility] o Does NOT accept  Medicaid/Commercial Insurance Only  [facility] [place] ([facility])  Marnee Guarneri, MD; Drema Dallas, MD; Montine Circle, MD; Vilma Prader, MD; Vanita Panda, MD; Alfonso Ramus, MD; Ruthann Cancer, MD; Roxanne Mins, MD; Rosalva Ferron, MD; Polly Cobia, MD o [address]  [place], [place] [address] o [phone] o Mon-Fri 8:30-5:30, Sat (Oct.-Mar.) 9:00-1:00 o Babies seen by providers at [place] [phone])  [facility] Peers, MD; Suzan Slick, MD o [address], [place], [place] [address] o [phone] o Mon-Fri 8:30-5:00, Sat 8:30-12:00 o Providers come to see babies at [facility] o Does NOT accept Medicaid  [place] at [place], [place]; Mannie Stabile, MD; [place], [place]; Nancy Fetter, MD; Moreen Fowler, [place], [place], [place] [address] o [phone] o Mon-Fri 8:00-5:00 o Babies seen by providers at [facility] o Does NOT accept Medicaid o Only accepting babies of parents who are patients o Please call early in hospitalization for appointment (limited availability)  [facility] Blanca Friend, MD; Sharlene Motts, MD; Rod Can, MD; Warner Mccreedy, NP; Sabra Heck, MD; Ermalinda Memos, MD; Sharlett Iles, NP; Aurther Loft, MD; Jerrye Beavers, MD; Marcello Moores, MD; Berline Lopes, MD; Charolette Forward, MD o [place], [place], [place] [address] o [phone] o Mon-Fri 8:00-5:00, Sat 9:00-12:00 o Providers come to see babies at [facility] o Does NOT accept [facility] [phone])  [place] [facility] [place] providers accepting new patients: Dayna Ramus, NP; Rolland Porter, [place], [place], [place] [address] o [phone] o Mon-Fri 8:00-5:00 o Babies seen by providers at [facility] o Does NOT accept Medicaid o Only accepting babies of parents who are patients o Please call early in hospitalization for appointment (limited availability)  [facility] Oswaldo Conroy, MD; Sheran Lawless, [address] [place], [place] [address] o [phone] (press 1 to schedule appointment) o Mon-Fri 8:00-5:00 o Providers come to see babies at [facility] o Does NOT accept [facility] Jodi Mourning, MD o [address], [place], [place]  [address] o [phone] o Mon-Fri 8:30-5:00 (lunch 12:30-1:00), extended hours by appointment only Wed 5:00-6:30 o Babies seen by [place] Medicaid  [place] at Evalyn Casco, MD; [place], MD; Ethlyn Gallery, MD o [place], [place], [place] [address] o [phone] o Mon-Fri 8:00-5:00 o Babies seen by [facility] providers o Does NOT accept Medicaid  [place] at [place], MD; Yong [place], MD; [place], [place] [place], [place] [address] o [phone] o Mon-Fri 8:00-5:00 o Babies seen by [facility] providers o Does NOT accept [facility]  [place], [place]; [place], [place]; [place], NP; Albertina Parr, MD; Frederic Jericho, MD; Ronney Lion, MD; Carlos Levering, NP; Jerelene Redden, NP; Tomasita Crumble, NP; Ronelle Nigh, NP; Corinna Lines, MD; Karsten Ro, MD o [address] [place], [place] [address] o [phone] o Mon-Fri 8:30-5:00, Sat 10:00-1:00 o Providers come to see babies at [facility] o Does NOT accept Medicaid o Free prenatal information session Tuesdays at 4:45pm  [place], MD; [place], [place]; [place], [place]; [place], [address] [place] [address] o [phone] o Mon-Fri 7:30-5:30 o Babies seen by [facility] o [address], [place], [place], [place]  [address] o [phone]   Fax - [phone]  [place] [phone] & [phone])  [place], [place] o [address] [place], [place] [address] o [phone] o Mon-Thur  8:00-6:00 o Providers come to see babies at [place], NP; Melford Aase, MD; [place], [place]; [place], [address] [place], [place] [address] o [phone] o Mon-Thur 7:30-7:30, Fri 7:30-4:30 o Babies seen by [facility] providers o Accepting [facility] Nyra Jabs, MD; Cristino Martes,  NP; Gertie Baron, MD o [address] [place], [place], [place] [address] o [phone] o Mon-Fri 8:30-5:00, Sat 8:30-12:00 o Providers come to see babies at [facility] o Accepting Medicaid o Must have Meet & Greet appointment at office prior to delivery  [place] ([place]) Jodene Nam, MD; Juleen China, MD; Clydene Laming, [place] [address], [place], [place] [address] o [phone] o Mon-Wed 8:00-6:00, Thur-Fri 8:00-5:00, Sat 9:00-12:00 o Providers come to see babies at [facility] o Does NOT accept Medicaid o Only accepting siblings of current patients  [facility]  o [address], [place], [place], [place]  [address] o [phone]   Fax - [phone]  [place] at [facility] o [phone] [address], [place], [place]  [address] o [phone]   Fax - [place] [phone] & [phone])  Therapist, music at [place], [place]; [place], [address] [place], [place] [address] o [phone] o Mon-Fri 7:00-5:00 o Babies seen by [facility] providers o Does NOT accept Medicaid  [place], MD; [place], [place]; [place], [address], [place], [place] [address] o [phone] o Mon-Fri 8:00-5:00 o Babies seen by [facility] providers o Accepting Medicaid  [place], MD; [place], [place]; [place], NP; [place], [place], [place], [place] [address] o [phone] o Mon-Fri 8:00-5:00 o Babies seen by providers at [place] [phone])  [facility] at [place], [place] o [address] [place], [place] [address] o [phone] o Mon-Fri 8:00-5:00 o Babies seen by [facility] providers o Does NOT accept  Medicaid o Limited availability, please call early in hospitalization to schedule follow-up  [facility] Leilani Merl, [place]; Maisie Fus, MD; Charlesetta Garibaldi, MD; [place], [place]; Jeannine Kitten, MD; [place], [place] [facility] [address], [place], [place] [address] o [phone] o Mon-Fri 8:30-5:00, Sat 9:00-12:00 o Babies seen by providers at [place] Medicaid o Please register online then schedule online or call office o [email or web address] ([place] [facility] [place]) Kristian Covey, NP; Dwyane Dee, MD; Leonidas Romberg, PA o [address], [place], [place] [address] o [phone] o Mon-Fri 8:00-5:00 o Babies seen by providers at [place] Medicaid  [place] ([facility]) Dairl Ponder, MD; Rayvon Char, NP; Melina Modena, MD o [address] [place], [place], [place] [address] o [phone] o Mon-Fri 8:00-5:30, Sat&Sun by appointment (phones open at 8:30) o Babies seen by [facility] providers o Accepting Medicaid o Must be a first-time baby or sibling of current patient  [place], [address], [place],   [address] o [phone]   Fax - [phone]  [place] [phone] & [phone])  [place], [place]; [place], [place]; Benjamine Mola, MD; [place], [place]; Harrell Lark, MD o [address]  Barbara Cower [place], [place] [address] o [phone] o Mon-Thur 8:00-7:00, Fri 8:00-5:00, Sat 8:00-12:00, Sun 9:00-12:00 o Babies seen by [facility] providers o Accepting Medicaid  [facility] at [facility], MD; Ruthann Cancer, MD; [facility], MD o [address] [place], [place], [place] [address] o [phone] o Mon-Thur 8:00-5:00 o Babies seen by providers at [place] Medicaid  [facility] [place] [facility] [place], MD; [place], MD; Amedeo Plenty,  MD; Bobby Rumpf, MD; [place], MD; Lavonia Drafts, MD; Ruthann Cancer, MD; Selinda Eon, MD; Audie Box, MD; Jim Like, MD; Christie Nottingham, MD; Hubbard Hartshorn, MD; Modena Nunnery, MD o [address] [place], [place] [address] o [phone] o Mon-Fri 8:00-5:30, Sat (Oct.-Mar.) 9:00-1:00 o Babies seen by providers at [facility] o Accepting Medicaid o Must fill out new patient packet, available online at [email or web address]  [place] ([facility]) Barnabas Lister, NP; Kenton Kingfisher, NP; Claiborne Billings, NP; Rolla Plate, MD; [place], [place]; Carola Rhine, MD; Tyron Russell, MD; Delia Chimes, NP o [address], [place], [place] [address] o [phone] o Mon-Thur 8:00-5:30, Fri 8:00-5:00 o Babies seen by providers at [place] [phone])  [place], [place]; [place], [place]; [place], [place]; [place], [place] o [address] [place], [place] [address] o [phone] o Mon-Fri 8:00-5:00 o Babies seen by providers at [place] [phone])  [place] at [place], [place]; Olen Pel, MD; [place], [place], [place], [place] [address] o [phone] o Mon-Fri 8:00-5:00 o Babies seen by providers at [facility] o Does NOT accept Medicaid o Limited appointment availability, please call early in hospitalization   [place] at [place], [place]; [place], [address] [place], [place] [address] o [phone] o Mon-Fri 8:00-5:00 o Babies seen by [facility] providers o Does NOT accept [facility] Su [place], MD; Guy Sandifer, MD; [place], [place]; [place], [place], [place], [place] [address] o [phone] o Mon-Fri 8:00-5:00 o After hours clinic [facility] [place], [place] [address]) [phone] Mon-Fri 5:00-8:00, Sat 12:00-6:00, Sun 10:00-4:00 o Babies seen by [facility]  providers o Accepting Medicaid  [place] at [facility] o [address], [place], [place]  [address] o [phone]   Fax - [phone]  [place] [phone])  [place] at [place], [place] o [address] [place] [address] [place], [place], [place] [address] o [phone] o Mon-Fri 8:00-5:00 o Babies seen by [facility] providers o Does NOT accept Medicaid  [place] ([place] at [place], [place] o [address], [place], [place] [address] o [phone] o Mon-Thur 8:00-7:00, Fri 8:00-5:00, Sat 8:00-12:00 o Babies seen by providers at [facility] o Accepting Medicaid - but does not have vaccinations in office (must be received elsewhere) o Limited availability, please call early in hospitalization  [place] [phone])  [place], MD o [address] [place] [address] o [phone]  Fax [phone]    Contraception Choices Contraception, also called birth control, means things to use or ways to try not to get pregnant. Hormonal birth control This kind of birth control uses hormones. Here are some types of hormonal birth control:  A tube that is put under  skin of the arm (implant). The tube can stay in for as long as 3 years.  Shots to get every 3 months (injections).  Pills to take every day (birth control pills).  A patch to change 1 time each week for 3 weeks (birth control patch). After that, the patch is taken off for 1 week.  A ring to put in the vagina. The ring is left in for 3 weeks. Then it is taken out of the vagina for 1 week. Then a new ring is put in.  Pills to take after unprotected sex (emergency birth control pills). Barrier birth control Here are some types of barrier birth control:  A thin covering that is put on the penis before sex (female condom). The covering is thrown away after sex.  A soft, loose covering that is  put in the vagina before sex (female condom). The covering is thrown away after sex.  A rubber bowl that sits over the cervix (diaphragm). The bowl must be made for you. The bowl is put into the vagina before sex. The bowl is left in for 6-8 hours after sex. It is taken out within 24 hours.  A small, soft cup that fits over the cervix (cervical cap). The cup must be made for you. The cup can be left in for 6-8 hours after sex. It is taken out within 48 hours.  A sponge that is put into the vagina before sex. It must be left in for at least 6 hours after sex. It must be taken out within 30 hours. Then it is thrown away.  A chemical that kills or stops sperm from getting into the uterus (spermicide). It may be a pill, cream, jelly, or foam to put in the vagina. The chemical should be used at least 10-15 minutes before sex. IUD (intrauterine) birth control An IUD is a small, T-shaped piece of plastic. It is put inside the uterus. There are two kinds:  Hormone IUD. This kind can stay in for 3-5 years.  Copper IUD. This kind can stay in for 10 years. Permanent birth control Here are some types of permanent birth control:  Surgery to block the fallopian tubes.  Having an insert put into each fallopian tube.  Surgery to tie off the tubes that carry sperm (vasectomy). Natural planning birth control Here are some types of natural planning birth control:  Not having sex on the days the woman could get pregnant.  Using a calendar: ? To keep track of the length of each period. ? To find out what days pregnancy can happen. ? To plan to not have sex on days when pregnancy can happen.  Watching for symptoms of ovulation and not having sex during ovulation. One way the woman can check for ovulation is to check her temperature.  Waiting to have sex until after ovulation. Summary  Contraception, also called birth control, means things to use or ways to try not to get pregnant.  Hormonal methods  of birth control include implants, injections, pills, patches, vaginal rings, and emergency birth control pills.  Barrier methods of birth control can include female condoms, female condoms, diaphragms, cervical caps, sponges, and spermicides.  There are two types of IUD (intrauterine device) birth control. An IUD can be put in a woman's uterus to prevent pregnancy for 3-5 years.  Permanent sterilization can be done through a procedure for males, females, or both.  Natural planning methods involve not having sex on the days when the woman  could get pregnant. This information is not intended to replace advice given to you by your health care provider. Make sure you discuss any questions you have with your health care provider. Document Released: 10/31/2008 Document Revised: 04/25/2018 Document Reviewed: 01/14/2016 Elsevier Patient Education  2020 [place] to have your son circumcised:                                                                      [facility]     [phone]   [phone] while you are in hospital         [facility]              [phone]   $269 by 4 wks                      [facility]                     [phone]   $269 by 7 days [facility]                    [phone]   $269 by 4 wks [facility]             [phone]   $225 by 2 wks    These prices sometimes change but are roughly what you can expect to pay. Please call and confirm pricing.   Circumcision is considered an elective/non-medically necessary procedure. There are many reasons parents decide to have their sons circumsized. During the first year of life circumcised males have a reduced risk of urinary tract infections but after this year the rates between circumcised males and uncircumcised males are the same.  It is safe to have your son circumcised outside of the hospital and the places above perform them regularly.   Deciding about Circumcision in Baby Boys  (Up-to-date The Basics)  What is circumcision?    Circumcision is a surgery that removes the skin that covers the tip of the penis, called the "foreskin" Circumcision is usually done when a boy is between 25 and 38 days old. In the [place], circumcision is common. In some other countries, fewer boys are circumcised. Circumcision is a common tradition in some religions.  Should I have my baby boy circumcised?   There is no easy answer. Circumcision has some benefits. But it also has risks. After talking with your doctor, you will have to decide for yourself what is right for your family.  What are the benefits of circumcision?   Circumcised boys seem to have slightly lower rates of: ?Urinary tract infections ?Swelling of the opening at the tip of the penis Circumcised men seem to have slightly lower rates of: ?Urinary tract infections ?Swelling of the opening at the tip of the penis ?Penis cancer ?HIV and other infections that you catch during sex ?Cervical cancer in the women they have sex with Even so, in the [place], the risks of these problems are small - even in boys and men who have not been circumcised. Plus, boys and men who are not circumcised can reduce these extra risks by: ?Cleaning their penis well ?Using condoms during sex  What are the risks of circumcision?  Risks include: ?Bleeding or infection from the surgery ?Damage to or amputation of the penis ?A chance that the doctor will cut off too much or not enough of the foreskin ?A chance that sex won't feel as good later in life Only about 1 out of every 200 circumcisions leads to problems. There is also a chance that your health insurance won't pay for circumcision.  How is circumcision done in baby boys?  First, the baby gets medicine for pain relief. This might be a cream on the skin or a shot into the base of the penis. Next, the doctor cleans the baby's penis well. Then he or she uses special tools to cut off the foreskin. Finally, the doctor wraps a  bandage (called gauze) around the baby's penis. If you have your baby circumcised, his doctor or nurse will give you instructions on how to care for him after the surgery. It is important that you follow those instructions carefully.

## 2018-09-10 ENCOUNTER — Telehealth: Payer: Self-pay

## 2018-09-10 NOTE — Telephone Encounter (Signed)
LM for pt that we have her BP cuff sent from [facility].  If she could please call the office and let us know when she will able to come pick up her BP cuff.  My Chart message sent as well.

## 2018-09-20 ENCOUNTER — Encounter (HOSPITAL_COMMUNITY): Payer: Self-pay

## 2018-09-20 ENCOUNTER — Other Ambulatory Visit: Payer: Self-pay

## 2018-09-20 ENCOUNTER — Ambulatory Visit (HOSPITAL_COMMUNITY)
Admission: RE | Admit: 2018-09-20 | Discharge: 2018-09-20 | Disposition: A | Discharge status: Home / Self Care | Payer: Medicaid Other | Source: Ambulatory Visit | Attending: Obstetrics and Gynecology | Admitting: Obstetrics and Gynecology

## 2018-09-20 ENCOUNTER — Ambulatory Visit (HOSPITAL_COMMUNITY): Payer: Medicaid Other | Admitting: *Deleted

## 2018-09-20 ENCOUNTER — Other Ambulatory Visit (HOSPITAL_COMMUNITY): Payer: Self-pay | Admitting: *Deleted

## 2018-09-20 DIAGNOSIS — O99519 Diseases of the respiratory system complicating pregnancy, unspecified trimester: Secondary | ICD-10-CM | POA: Insufficient documentation

## 2018-09-20 DIAGNOSIS — Z363 Encounter for antenatal screening for malformations: Secondary | ICD-10-CM

## 2018-09-20 DIAGNOSIS — O359XX0 Maternal care for (suspected) fetal abnormality and damage, unspecified, not applicable or unspecified: Secondary | ICD-10-CM

## 2018-09-20 DIAGNOSIS — I639 Cerebral infarction, unspecified: Secondary | ICD-10-CM | POA: Insufficient documentation

## 2018-09-20 DIAGNOSIS — Z3A19 19 weeks gestation of pregnancy: Secondary | ICD-10-CM | POA: Diagnosis not present

## 2018-09-20 DIAGNOSIS — O0990 Supervision of high risk pregnancy, unspecified, unspecified trimester: Secondary | ICD-10-CM

## 2018-09-20 DIAGNOSIS — J45909 Unspecified asthma, uncomplicated: Secondary | ICD-10-CM | POA: Diagnosis present

## 2018-09-20 DIAGNOSIS — Z8673 Personal history of transient ischemic attack (TIA), and cerebral infarction without residual deficits: Secondary | ICD-10-CM

## 2018-09-20 DIAGNOSIS — Z362 Encounter for other antenatal screening follow-up: Secondary | ICD-10-CM

## 2018-09-22 ENCOUNTER — Encounter: Payer: Self-pay | Admitting: Obstetrics and Gynecology

## 2018-09-26 ENCOUNTER — Telehealth: Payer: Self-pay

## 2018-09-26 NOTE — Telephone Encounter (Signed)
Called pt to let her know that her letter that she requested is waiting at front desk, no answer, left VM.

## 2018-10-02 ENCOUNTER — Telehealth: Payer: Self-pay | Admitting: Obstetrics & Gynecology

## 2018-10-02 NOTE — Telephone Encounter (Signed)
Called the patient to inform of the upcoming mychart visit. Informed the patient of expecting a phone call from our office with information of when to log in. The patient verbalized understanding.

## 2018-10-03 ENCOUNTER — Telehealth (INDEPENDENT_AMBULATORY_CARE_PROVIDER_SITE_OTHER): Payer: Medicaid Other | Admitting: Medical

## 2018-10-03 ENCOUNTER — Other Ambulatory Visit: Payer: Self-pay

## 2018-10-03 ENCOUNTER — Encounter: Payer: Self-pay | Admitting: Medical

## 2018-10-03 VITALS — BP 113/79

## 2018-10-03 DIAGNOSIS — O0992 Supervision of high risk pregnancy, unspecified, second trimester: Secondary | ICD-10-CM

## 2018-10-03 DIAGNOSIS — B951 Streptococcus, group B, as the cause of diseases classified elsewhere: Secondary | ICD-10-CM

## 2018-10-03 DIAGNOSIS — O0990 Supervision of high risk pregnancy, unspecified, unspecified trimester: Secondary | ICD-10-CM

## 2018-10-03 DIAGNOSIS — Z3A20 20 weeks gestation of pregnancy: Secondary | ICD-10-CM

## 2018-10-03 DIAGNOSIS — O2342 Unspecified infection of urinary tract in pregnancy, second trimester: Secondary | ICD-10-CM

## 2018-10-03 NOTE — Progress Notes (Signed)
8:28a- Called pt for My Chart visit, no answer, left VM that will retry in 10 to 15 minutes.    8:38a-Pt answered.Pt also has her BP Cuff.

## 2018-10-03 NOTE — Patient Instructions (Signed)

## 2018-10-03 NOTE — Progress Notes (Signed)
I connected with Tina Gibbs on 10/03/18 at  8:35 AM EDT by: MyChart and verified that I am speaking with the correct person using two identifiers.  Patient is located at home and provider is located at [facility].     The purpose of this virtual visit is to provide medical care while limiting exposure to the novel coronavirus. I discussed the limitations, risks, security and privacy concerns of performing an evaluation and management service by MyChart and the availability of in person appointments. I also discussed with the patient that there may be a patient responsible charge related to this service. By engaging in this virtual visit, you consent to the provision of healthcare.  Additionally, you authorize for your insurance to be billed for the services provided during this visit.  The patient expressed understanding and agreed to proceed.  The following staff members participated in the virtual visit:  Carver Fila, [place] VISIT NOTE  Subjective:  Tina Gibbs is a 18 y.o. G1P0 at [redacted]w[redacted]d  for phone visit for ongoing prenatal care.  She is currently monitored for the following issues for this low-risk pregnancy and has Unspecified asthma(493.90); Seasonal allergies; ADHD (attention deficit hyperactivity disorder); Influenza vaccination declined; Supervision of high risk pregnancy, antepartum; Asthma; Anxiety and depression; UTI in pregnancy; and GBS (group B streptococcus) UTI complicating pregnancy on their problem list.  Patient reports no complaints.  Contractions: Not present. Vag. Bleeding: None.  Movement: Present. Denies leaking of fluid.   The following portions of the patient's history were reviewed and updated as appropriate: allergies, current medications, past family history, past medical history, past social history, past surgical history and problem list.   Objective:   Vitals:   10/03/18 0840  BP: 113/79   Self-Obtained  Fetal Status:     Movement: Present      Assessment and Plan:  Pregnancy: G1P0 at [redacted]w[redacted]d 1. Supervision of high risk pregnancy, antepartum - Doing well, no complaints  - Received BP cuff 2 weeks ago and is logging values weekly without difficulty   2. Group B Streptococcus urinary tract infection affecting pregnancy in second trimester - Will need treatment in labor - Patient will be scheduled for lab visit on 10/2 when she comes in for next [place] to give urine culture sample and obtain blood test for AFP   Preterm labor/second trimester warning symptoms and general obstetric precautions including but not limited to vaginal bleeding, contractions, leaking of fluid and fetal movement were reviewed in detail with the patient.  Return in about 4 weeks (around 10/31/2018) for LOB, Virtual.  Future Appointments  Date Time Provider [place]  10/19/2018 10:10 AM [place] [facility]-US  10/19/2018 10:15 AM WH-MFC [place] 4 WH-MFCUS MFC-US     Time spent on virtual visit: 10 minutes  Kerry Hough, PA-C

## 2018-10-17 ENCOUNTER — Other Ambulatory Visit: Payer: Self-pay | Admitting: *Deleted

## 2018-10-17 DIAGNOSIS — O0990 Supervision of high risk pregnancy, unspecified, unspecified trimester: Secondary | ICD-10-CM

## 2018-10-19 ENCOUNTER — Ambulatory Visit (HOSPITAL_COMMUNITY): Payer: Medicaid Other | Admitting: *Deleted

## 2018-10-19 ENCOUNTER — Other Ambulatory Visit: Payer: Medicaid Other

## 2018-10-19 ENCOUNTER — Ambulatory Visit: Payer: Medicaid Other | Admitting: Clinical

## 2018-10-19 ENCOUNTER — Encounter: Payer: Self-pay | Admitting: Medical

## 2018-10-19 ENCOUNTER — Other Ambulatory Visit: Payer: Self-pay

## 2018-10-19 ENCOUNTER — Ambulatory Visit (HOSPITAL_COMMUNITY)
Admission: RE | Admit: 2018-10-19 | Discharge: 2018-10-19 | Disposition: A | Discharge status: Home / Self Care | Payer: Medicaid Other | Source: Ambulatory Visit | Attending: Obstetrics and Gynecology | Admitting: Obstetrics and Gynecology

## 2018-10-19 ENCOUNTER — Encounter (HOSPITAL_COMMUNITY): Payer: Self-pay

## 2018-10-19 DIAGNOSIS — O0990 Supervision of high risk pregnancy, unspecified, unspecified trimester: Secondary | ICD-10-CM

## 2018-10-19 DIAGNOSIS — Z3A23 23 weeks gestation of pregnancy: Secondary | ICD-10-CM | POA: Diagnosis not present

## 2018-10-19 DIAGNOSIS — J45909 Unspecified asthma, uncomplicated: Secondary | ICD-10-CM | POA: Diagnosis not present

## 2018-10-19 DIAGNOSIS — Z362 Encounter for other antenatal screening follow-up: Secondary | ICD-10-CM | POA: Insufficient documentation

## 2018-10-19 DIAGNOSIS — O99891 Other specified diseases and conditions complicating pregnancy: Secondary | ICD-10-CM | POA: Diagnosis not present

## 2018-10-19 IMAGING — US US MFM OB FOLLOW-UP
1 series · 14 of 28 positions shown · non-contrast
Comparison: none

[Series 1: us mfm ob follow-up · 14 of 82 slices shown]
[im 4/82]
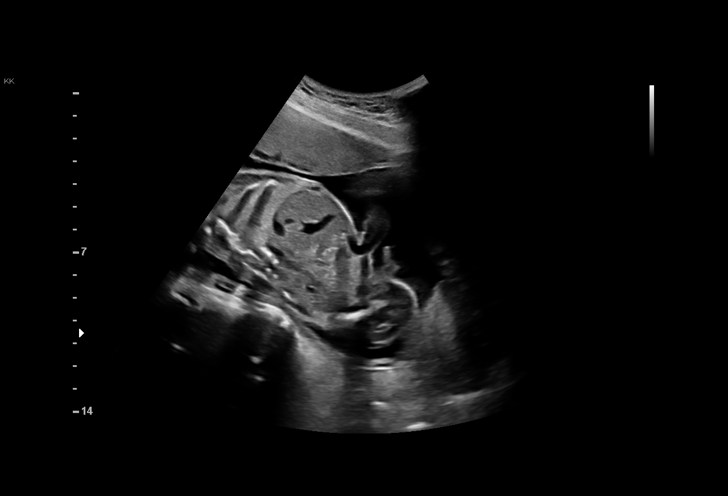
[im 10/82]
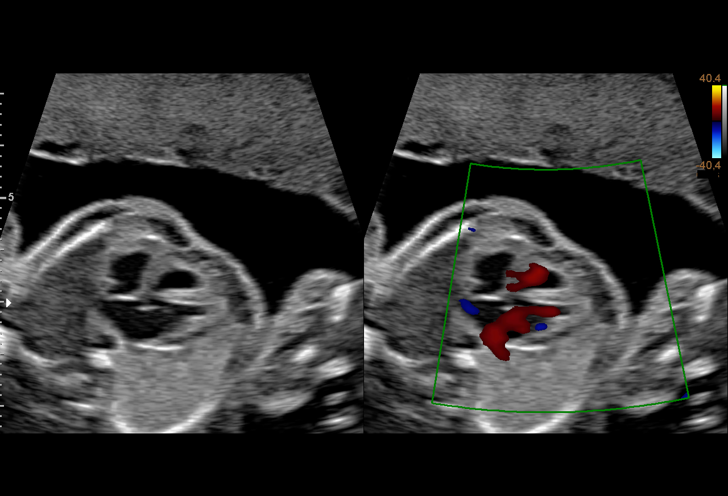
[im 16/82]
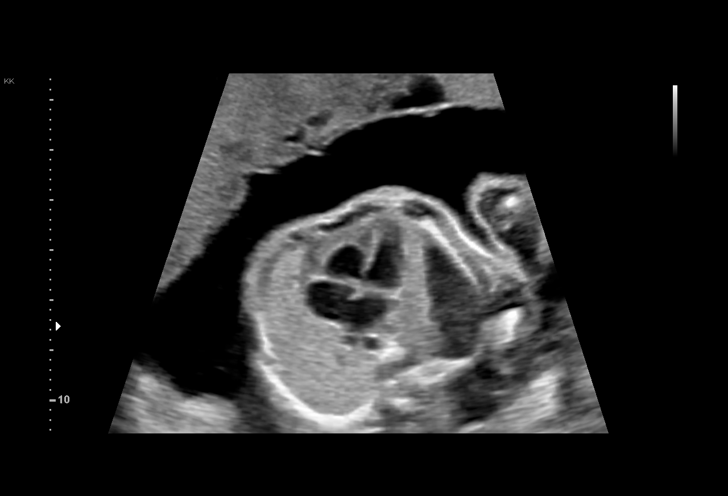
[im 22/82]
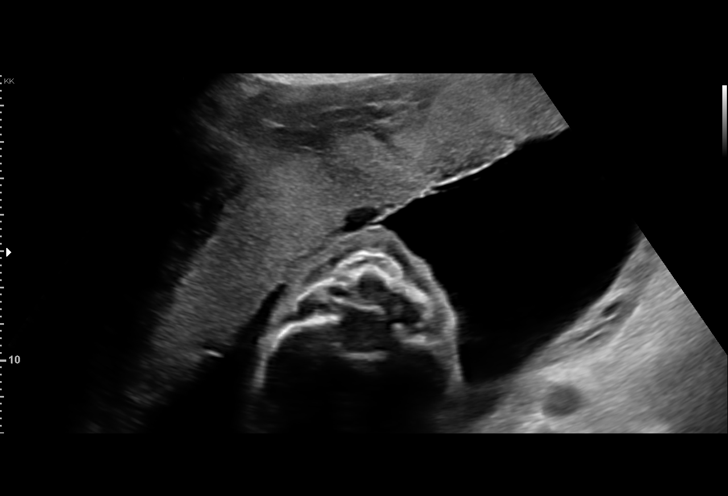
[im 28/82]
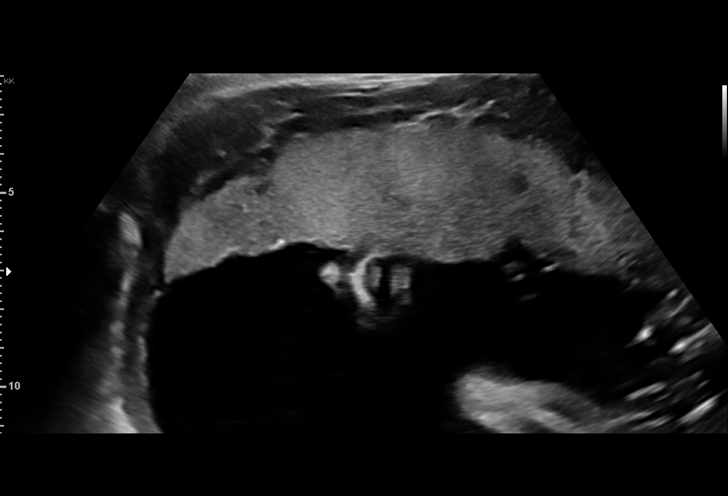
[im 34/82]
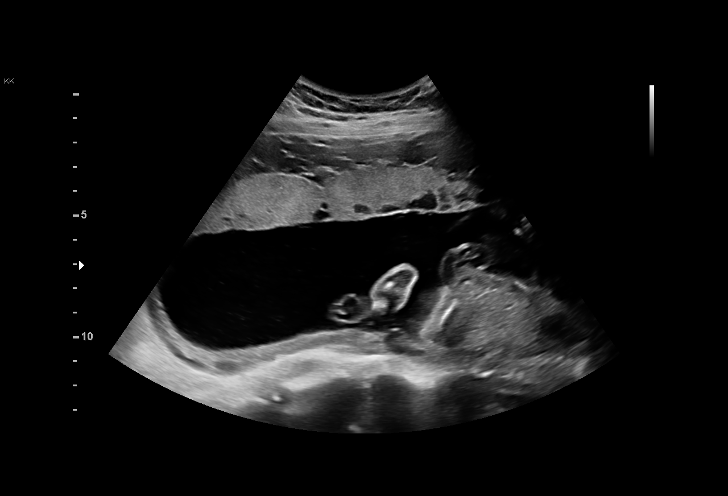
[im 40/82]
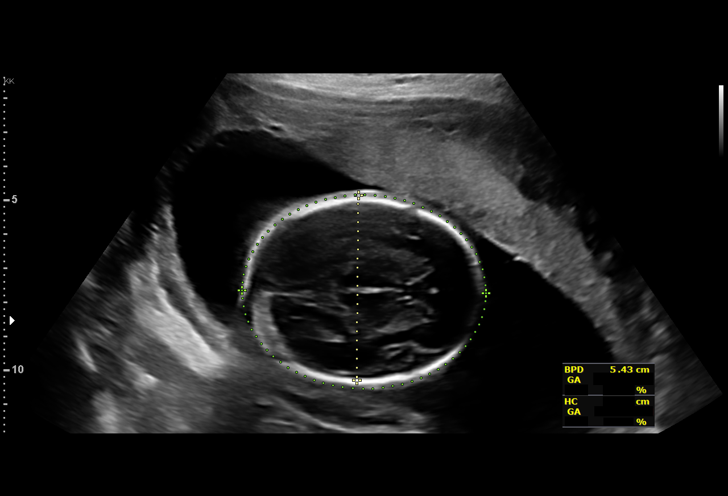
[im 46/82]
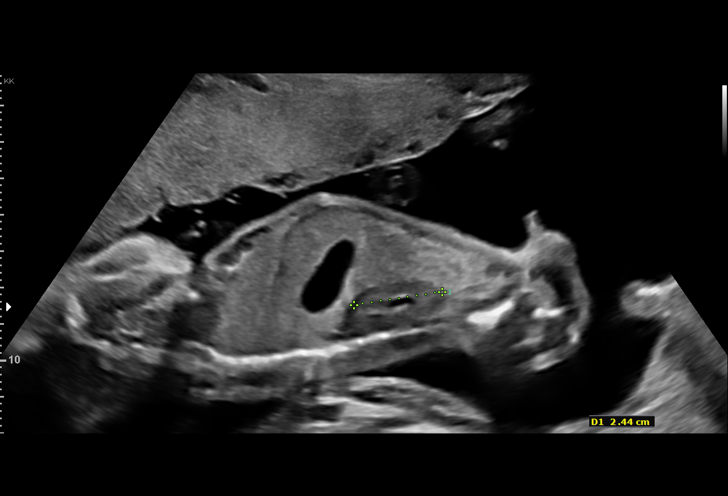
[im 52/82]
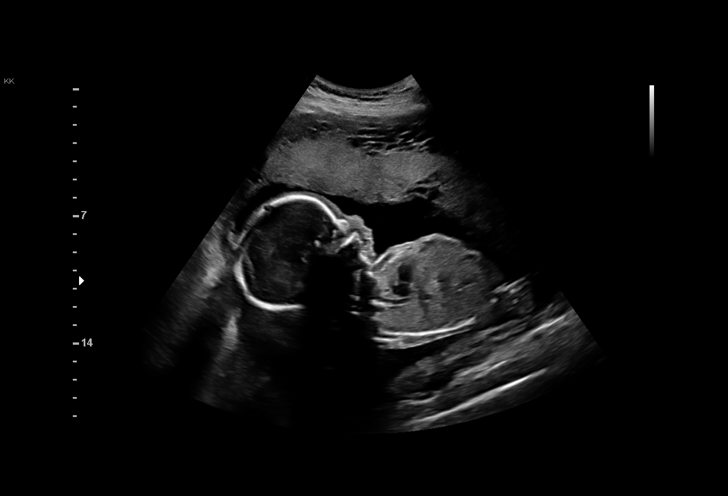
[im 58/82]
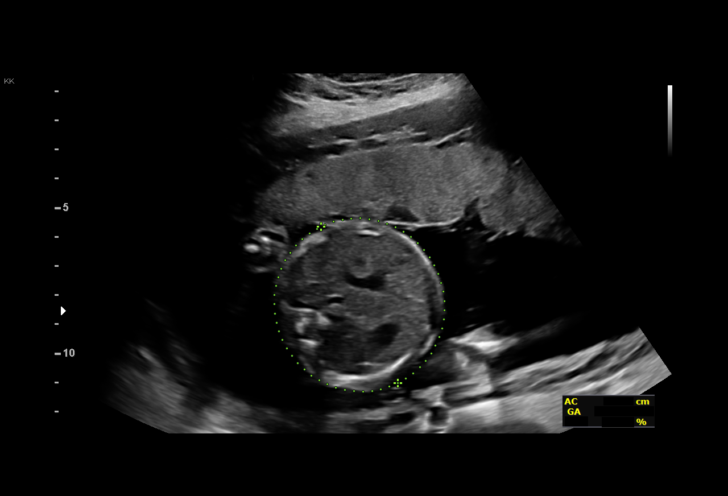
[im 64/82]
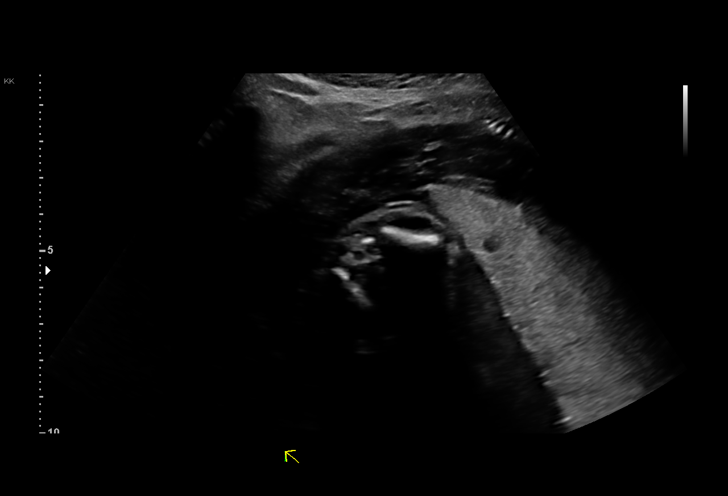
[im 70/82]
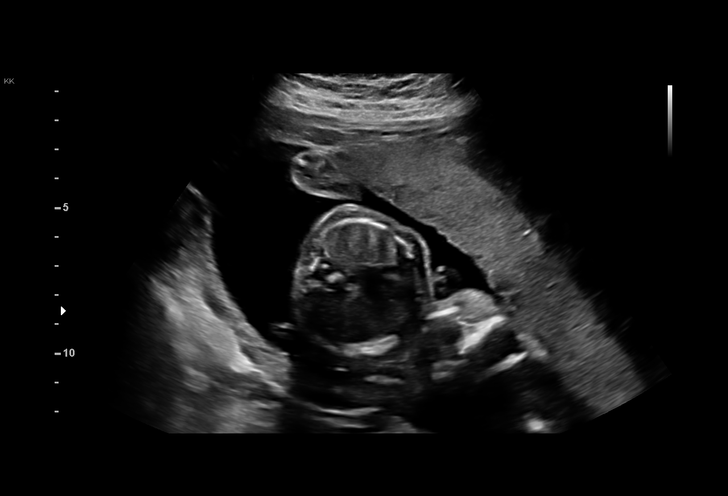
[im 76/82]
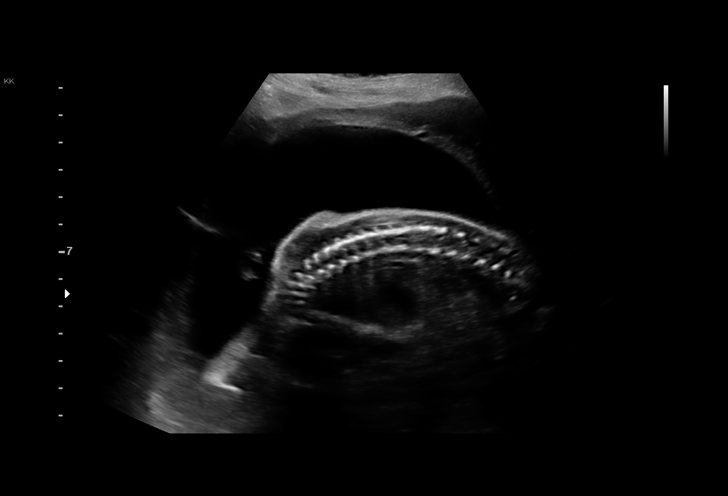
[im 82/82]
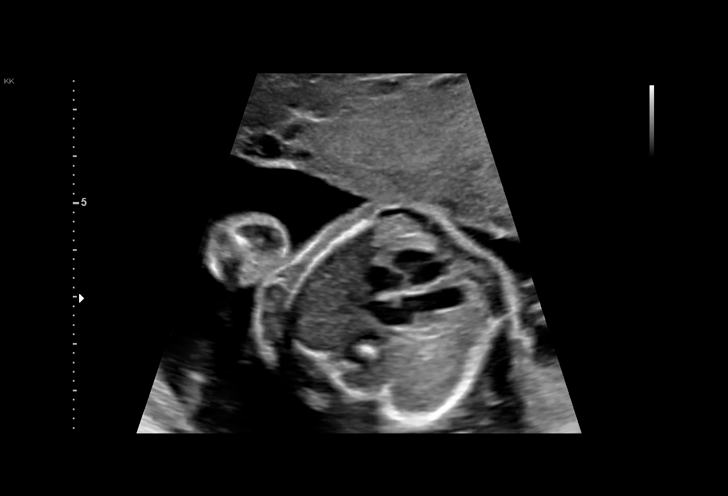

[14 of 28 positions shown; findings below may reference images not displayed]

[address]

 ----------------------------------------------------------------------

 ----------------------------------------------------------------------
Indications

  Antenatal follow-up for nonvisualized fetal    [P5]
  anatomy
  Low Risk NIPS
  23 weeks gestation of pregnancy
  Asthma                                         [P5] [P5]
 ----------------------------------------------------------------------
Fetal Evaluation

 Num Of Fetuses:          1
 Fetal Heart Rate(bpm):   148
 Cardiac Activity:        Observed
 Presentation:            Breech
 Placenta:                Anterior
 P. Cord Insertion:       Previously Visualized

 Amniotic Fluid
 AFI FV:      Within normal limits

                             Largest Pocket(cm)

Biometry

 BPD:      54.1  mm     G. Age:  22w 3d         21  %    CI:        72.06   %    70 - 86
                                                         FL/HC:       19.4  %    19.2 -
 HC:      202.8  mm     G. Age:  22w 3d         12  %    HC/AC:       1.12       1.05 -
 AC:      180.5  mm     G. Age:  22w 6d         34  %    FL/BPD:      72.8  %    71 - 87
 FL:       39.4  mm     G. Age:  22w 5d         24  %    FL/AC:       21.8  %    20 - 24

 Est. FW:     531   gm     1 lb 3 oz     25  %
OB History

 Gravidity:    1         Term:   0        Prem:   0        SAB:   0
 TOP:          0       Ectopic:  0        Living: 0
Gestational Age

 LMP:           23w 1d        Date:  [DATE]                 EDD:   [DATE]
 U/S Today:     22w 4d                                        EDD:   [DATE]
 Best:          23w 1d     Det. By:  LMP  ([DATE])          EDD:   [DATE]
Anatomy

 Cranium:               Appears normal         Aortic Arch:            Previously seen
 Cavum:                 Previously seen        Ductal Arch:            Previously seen
 Ventricles:            Appears normal         Diaphragm:              Appears normal
 Choroid Plexus:        Previously seen        Stomach:                Appears normal, left
                                                                       sided
 Cerebellum:            Previously seen        Abdomen:                Appears normal
 Posterior Fossa:       Previously seen        Abdominal Wall:         Previously seen
 Nuchal Fold:           Previously seen        Cord Vessels:           Previously seen
 Face:                  Orbits and profile     Kidneys:                Appear normal
                        previously seen
 Lips:                  Previously seen        Bladder:                Appears normal
 Thoracic:              Appears normal         Spine:                  Appears normal
 Heart:                 Appears normal         Upper Extremities:      Previously seen
                        (4CH, axis, and
                        situs)
 RVOT:                  Appears normal         Lower Extremities:      Previously seen
 LVOT:                  Appears normal

 Other:  Fetus appears to be a male. Heels and Nasal bone previously
         visualized. Technically difficult due to fetal position.
Cervix Uterus Adnexa

 Cervix
 Length:            3.5  cm.
 Normal appearance by transabdominal scan.
Impression

 Normal interval growth
 Low risk NIPS
 Anatomy complete
 Good fetal movement and amniotic fluid
Recommendations

 Follow up as clinically indicated.

## 2018-10-19 NOTE — Patient Instructions (Signed)
[place] [employer] [facility]  [employer] [place] [address], [place], [place] [address] [phone]   or  www.[place].gov[place]human-services/social-services **SNAP/EBT/ Other nutritional benefits  [place] DHHS-Public Health-WIC [address], [place], [place] [address] [phone]  or  https://[place].gov[place]/human-services/health-department **WIC for  women who are pregnant and postpartum, infants and children up to 5 years old  [facility] [address], [place], [place] [address] [phone]   or   www.theblessedtable.org  **Food pantry  [facility] [address], [place], [place] [address] [phone]   or   https://brotherkolbes.godaddysites.com  **Emergency food and prepared meals  [facility] [employer] [facility] [address], [place], [place] [address] [phone]   or   www.cedargrovetop.us **Food pantry  [facility] [address], [place], [place] [address] [phone]   or   www.facebook.com/[facility]-116430931718202 **Food pantry  [facility] [address], [place], [place] [address] [phone] **Food pantry  [facility] [address], [place], [place] [address] [phone]   or   www.greensborourbanministry.org  **Food pantry and prepared meals  [facility] [address], [place], [place] [address] https://[email or web address]  **Food pantry  [facility] [employer] [facility] [address], [place], [place] [address] [phone]   or   www.lbcnow.org  **Food pantry  [facility] [address], [place], [place] [address] [phone]   or   http://www.onestepfurther.com **Food pantry, nutrition education, gardening activities  [facility] [address], [place], [place] [address] [phone] **Food pantry  [facility] [place] [address], [place], [place] [address] [phone]   or   www.salvationarmyof[place].org **Food pantry  [facility] [place] [address], [place], [place] [address] [phone]   or   http://senior-resources[place].org **Meals on Wheels Program  [place] [place] [facility] [address], [place], [place] [address] [phone]   or   www.stmattchurch.com  **Food pantry  [facility] [facility] [address], , [place] [address] [phone]   or   [facility].org **Food pantry     

## 2018-10-19 NOTE — BH Specialist Note (Signed)
Integrated Behavioral Health Follow Up Visit  MRN: [id number] Name: NATUSHA ALLISTON  Number of [place] Clinician visits: 2/6 Session Start time: 11:29  Session End time: 11:38 Total time: 15 minutes  Type of Service: [place] Interpretor:No. Interpretor Name and Language: n/a  SUBJECTIVE: CAMREE CABA is a 18 y.o. female accompanied by n/a Patient was referred by Kerry Hough for food resources. Patient reports the following symptoms/concerns: Pt states she is unable to work due to high risk pregnancy, and is food insecure and does not qualify for EBT because she is not working.  Duration of problem: Current pregnancy; Severity of problem: moderate  OBJECTIVE: Mood: Normal and Affect: Appropriate Risk of harm to self or others: No plan to harm self or others  LIFE CONTEXT: Family and Social: - School/Work: Unemployed Self-Care: - Life Changes: Current pregnancy  GOALS ADDRESSED: Patient will: 1.  Reduce symptoms of: stress  2.  Demonstrate ability to: Increase healthy adjustment to current life circumstances  INTERVENTIONS: Interventions utilized:  Link to [employer] Standardized Assessments completed: Not Needed  ASSESSMENT: Patient currently experiencing Psychosocial stress.   Patient may benefit from link to community resources today.  PLAN: 1. Follow up with behavioral health clinician on : As needed 2. Behavioral recommendations:  -Accept referral to [facility] food resources 3. Referral(s): [id number] 4. "From scale of 1-10, how likely are you to follow plan?": -  Garlan Fair, LCSW

## 2018-10-21 LAB — URINE CULTURE, OB REFLEX

## 2018-10-21 LAB — CULTURE, OB URINE

## 2018-10-25 LAB — AFP, SERUM, OPEN SPINA BIFIDA
AFP MoM: 1.65
AFP Value: 159.1 ng/mL
Gest. Age on Collection Date: 23 weeks
Maternal Age At EDD: 20.3 yr
OSBR Risk 1 IN: 1848
Test Results:: NEGATIVE
Weight: 125 [lb_av]

## 2018-10-31 ENCOUNTER — Encounter: Payer: Medicaid Other | Admitting: Obstetrics & Gynecology

## 2018-10-31 ENCOUNTER — Other Ambulatory Visit: Payer: Self-pay

## 2018-10-31 NOTE — Progress Notes (Signed)
Attempted to call pt at [phone] with no answer. Left a VM stating I was calling to check pt in for virtual appt and would call back at appt time.   Called 2nd time at 54. Left VM stating I would call back in 15 minutes at which time we will need to reschedule if pt is not available.   Called 3rd time at 70. Left VM stating pt will need to reschedule with front office.

## 2018-10-31 NOTE — Progress Notes (Signed)
Pt was a no show

## 2018-11-09 ENCOUNTER — Encounter: Payer: Medicaid Other | Admitting: Obstetrics & Gynecology

## 2018-11-09 ENCOUNTER — Other Ambulatory Visit: Payer: Self-pay

## 2018-11-09 NOTE — Progress Notes (Signed)
@  0816am called for appointment LVM that I was calling to get her checked in for her appointment and to call our office back will attempt to call back in 5-10 mins. @0830am  LVM to cal the office back to have her appointment rescheduled.

## 2018-11-12 ENCOUNTER — Other Ambulatory Visit: Payer: Self-pay | Admitting: Pediatrics

## 2018-11-15 ENCOUNTER — Telehealth (INDEPENDENT_AMBULATORY_CARE_PROVIDER_SITE_OTHER): Payer: Medicaid Other | Admitting: Obstetrics and Gynecology

## 2018-11-15 ENCOUNTER — Other Ambulatory Visit: Payer: Self-pay

## 2018-11-15 DIAGNOSIS — O0992 Supervision of high risk pregnancy, unspecified, second trimester: Secondary | ICD-10-CM | POA: Diagnosis not present

## 2018-11-15 DIAGNOSIS — O0990 Supervision of high risk pregnancy, unspecified, unspecified trimester: Secondary | ICD-10-CM

## 2018-11-15 DIAGNOSIS — Z3A27 27 weeks gestation of pregnancy: Secondary | ICD-10-CM | POA: Diagnosis not present

## 2018-11-15 NOTE — Progress Notes (Signed)
   TELEHEALTH VIRTUAL OBSTETRICS VISIT ENCOUNTER NOTE  I connected with Tina Gibbs on 11/15/18 at  9:35 AM EDT by telephone at home and verified that I am speaking with the correct person using two identifiers.   I discussed the limitations, risks, security and privacy concerns of performing an evaluation and management service by telephone and the availability of in person appointments. I also discussed with the patient that there may be a patient responsible charge related to this service. The patient expressed understanding and agreed to proceed.  Subjective:  Tina Gibbs is a 18 y.o. G1P0 at [redacted]w[redacted]d being followed for ongoing prenatal care.  She is currently monitored for the following issues for this low-risk pregnancy and has Unspecified asthma(493.90); Seasonal allergies; ADHD (attention deficit hyperactivity disorder); Influenza vaccination declined; Supervision of high risk pregnancy, antepartum; Asthma; Anxiety and depression; UTI in pregnancy; and GBS (group B streptococcus) UTI complicating pregnancy on their problem list.  Patient reports nausea. Reports fetal movement. Denies any contractions, bleeding or leaking of fluid.   The following portions of the patient's history were reviewed and updated as appropriate: allergies, current medications, past family history, past medical history, past social history, past surgical history and problem list.   Objective:   General:  Alert, oriented and cooperative.   Mental Status: Normal mood and affect perceived. Normal judgment and thought content.  Rest of physical exam deferred due to type of encounter  Assessment and Plan:  Pregnancy: G1P0 at [redacted]w[redacted]d 1. Supervision of high risk pregnancy, antepartum  BP 112/77 today Doing great Discussed outpatient circ Desires Depo- IP   Preterm labor symptoms and general obstetric precautions including but not limited to vaginal bleeding, contractions, leaking of fluid and fetal  movement were reviewed in detail with the patient.  I discussed the assessment and treatment plan with the patient. The patient was provided an opportunity to ask questions and all were answered. The patient agreed with the plan and demonstrated an understanding of the instructions. The patient was advised to call back or seek an in-person office evaluation/go to [facility] for any urgent or concerning symptoms. Please refer to After Visit Summary for other counseling recommendations.   I provided 10 minutes of non-face-to-face time during this encounter.  Return in about 2 weeks (around 11/29/2018), or 2-3 weeks in office for 2 hour GTT.  No future appointments.  Noni Saupe, NP Center for [facility], [place]

## 2018-11-15 NOTE — Progress Notes (Signed)
I connected with  Tina Gibbs on 11/15/18 at  9:35 AM EDT by telephone and verified that I am speaking with the correct person using two identifiers.   I discussed the limitations, risks, security and privacy concerns of performing an evaluation and management service by telephone and the availability of in person appointments. I also discussed with the patient that there may be a patient responsible charge related to this service. The patient expressed understanding and agreed to proceed.  [place], [place] 11/15/2018  9:26 AM

## 2018-11-27 ENCOUNTER — Other Ambulatory Visit: Payer: Self-pay | Admitting: Lactation Services

## 2018-11-27 DIAGNOSIS — O0990 Supervision of high risk pregnancy, unspecified, unspecified trimester: Secondary | ICD-10-CM

## 2018-11-29 ENCOUNTER — Other Ambulatory Visit: Payer: Medicaid Other

## 2018-11-29 ENCOUNTER — Other Ambulatory Visit: Payer: Self-pay

## 2018-11-29 ENCOUNTER — Ambulatory Visit (INDEPENDENT_AMBULATORY_CARE_PROVIDER_SITE_OTHER): Payer: Medicaid Other | Admitting: Obstetrics and Gynecology

## 2018-11-29 DIAGNOSIS — O0990 Supervision of high risk pregnancy, unspecified, unspecified trimester: Secondary | ICD-10-CM

## 2018-11-29 DIAGNOSIS — O0993 Supervision of high risk pregnancy, unspecified, third trimester: Secondary | ICD-10-CM

## 2018-11-29 DIAGNOSIS — Z23 Encounter for immunization: Secondary | ICD-10-CM

## 2018-11-29 DIAGNOSIS — Z3A29 29 weeks gestation of pregnancy: Secondary | ICD-10-CM

## 2018-11-29 MED ORDER — FAMOTIDINE 20 MG PO TABS: 20.0000 mg | ORAL_TABLET | Freq: Two times a day (BID) | ORAL | 1 refills | Status: DC

## 2018-11-29 NOTE — Progress Notes (Signed)
   PRENATAL VISIT NOTE  Subjective:  Tina Gibbs is a 18 y.o. G1P0 at [redacted]w[redacted]d being seen today for ongoing prenatal care.  She is currently monitored for the following issues for this low-risk pregnancy and has Unspecified asthma(493.90); Seasonal allergies; ADHD (attention deficit hyperactivity disorder); Influenza vaccination declined; Supervision of high risk pregnancy, antepartum; Asthma; Anxiety and depression; UTI in pregnancy; and GBS (group B streptococcus) UTI complicating pregnancy on their problem list.  Patient reports no complaints.   .  .   . Denies leaking of fluid.   The following portions of the patient's history were reviewed and updated as appropriate: allergies, current medications, past family history, past medical history, past social history, past surgical history and problem list.   Objective:   Vitals:   11/29/18 0852  BP: 119/71  Pulse: 77  Weight: 138 lb 6.4 oz (62.8 kg)    Fetal Status: Fetal Heart Rate (bpm): 150 Fundal Height: 28 cm       General:  Alert, oriented and cooperative. Patient is in no acute distress.  Skin: Skin is warm and dry. No rash noted.   Cardiovascular: Normal heart rate noted  Respiratory: Normal respiratory effort, no problems with respiration noted  Abdomen: Soft, gravid, appropriate for gestational age.        Pelvic: Cervical exam deferred        Extremities: Normal range of motion.     Mental Status: Normal mood and affect. Normal behavior. Normal judgment and thought content.   Assessment and Plan:  Pregnancy: G1P0 at [redacted]w[redacted]d  1. Supervision of high risk pregnancy, antepartum  2 hour GTT today TDAP given.   There are no diagnoses linked to this encounter. Preterm labor symptoms and general obstetric precautions including but not limited to vaginal bleeding, contractions, leaking of fluid and fetal movement were reviewed in detail with the patient. Please refer to After Visit Summary for other counseling  recommendations.   No follow-ups on file.  Future Appointments  Date Time Provider [facility]  11/29/2018  9:30 AM [facility] [place], NP

## 2018-11-29 NOTE — Addendum Note (Signed)
Addended by: Louisa Second E on: 11/29/2018 12:02 PM   Modules accepted: Orders

## 2018-11-30 LAB — GLUCOSE TOLERANCE, 2 HOURS W/ 1HR
Glucose, 1 hour: 77 mg/dL (ref 65–179)
Glucose, 2 hour: 91 mg/dL (ref 65–152)
Glucose, Fasting: 77 mg/dL (ref 65–91)

## 2018-11-30 LAB — CBC
Hematocrit: 30.4 % — ABNORMAL LOW (ref 34.0–46.6)
Hemoglobin: 10.3 g/dL — ABNORMAL LOW (ref 11.1–15.9)
MCH: 32.4 pg (ref 26.6–33.0)
MCHC: 33.9 g/dL (ref 31.5–35.7)
MCV: 96 fL (ref 79–97)
Platelets: 216 10*3/uL (ref 150–450)
RBC: 3.18 x10E6/uL — ABNORMAL LOW (ref 3.77–5.28)
RDW: 12.4 % (ref 11.7–15.4)
WBC: 10.8 10*3/uL (ref 3.4–10.8)

## 2018-11-30 LAB — RPR: RPR Ser Ql: NONREACTIVE

## 2018-11-30 LAB — HIV ANTIBODY (ROUTINE TESTING W REFLEX): HIV Screen 4th Generation wRfx: NONREACTIVE

## 2018-12-03 DIAGNOSIS — O99019 Anemia complicating pregnancy, unspecified trimester: Secondary | ICD-10-CM | POA: Insufficient documentation

## 2018-12-17 ENCOUNTER — Other Ambulatory Visit: Payer: Self-pay

## 2018-12-17 ENCOUNTER — Telehealth (INDEPENDENT_AMBULATORY_CARE_PROVIDER_SITE_OTHER): Payer: Medicaid Other | Admitting: Advanced Practice Midwife

## 2018-12-17 VITALS — BP 113/71

## 2018-12-17 DIAGNOSIS — O99820 Streptococcus B carrier state complicating pregnancy: Secondary | ICD-10-CM

## 2018-12-17 DIAGNOSIS — O0993 Supervision of high risk pregnancy, unspecified, third trimester: Secondary | ICD-10-CM

## 2018-12-17 DIAGNOSIS — O0990 Supervision of high risk pregnancy, unspecified, unspecified trimester: Secondary | ICD-10-CM

## 2018-12-17 DIAGNOSIS — Z3A31 31 weeks gestation of pregnancy: Secondary | ICD-10-CM

## 2018-12-17 DIAGNOSIS — O2343 Unspecified infection of urinary tract in pregnancy, third trimester: Secondary | ICD-10-CM

## 2018-12-17 DIAGNOSIS — B951 Streptococcus, group B, as the cause of diseases classified elsewhere: Secondary | ICD-10-CM

## 2018-12-17 NOTE — Progress Notes (Signed)
   TELEHEALTH OBSTETRICS PRENATAL VIRTUAL VIDEO VISIT ENCOUNTER NOTE  Provider location: Center for [facility] at [place]   I connected with Tina Gibbs on 12/17/18 at  3:15 PM EST by MyChart Video Encounter at home and verified that I am speaking with the correct person using two identifiers.   I discussed the limitations, risks, security and privacy concerns of performing an evaluation and management service virtually and the availability of in person appointments. I also discussed with the patient that there may be a patient responsible charge related to this service. The patient expressed understanding and agreed to proceed. Subjective:  Tina Gibbs is a 18 y.o. G1P0 at [redacted]w[redacted]d being seen today for ongoing prenatal care.  She is currently monitored for the following issues for this low-risk pregnancy and has Unspecified asthma(493.90); Seasonal allergies; ADHD (attention deficit hyperactivity disorder); Influenza vaccination declined; Supervision of high risk pregnancy, antepartum; Asthma; Anxiety and depression; UTI in pregnancy; GBS (group B streptococcus) UTI complicating pregnancy; and Anemia in pregnancy on their problem list.  Patient reports no complaints.  Contractions: Not present. Vag. Bleeding: None.  Movement: Present. Denies any leaking of fluid.   The following portions of the patient's history were reviewed and updated as appropriate: allergies, current medications, past family history, past medical history, past social history, past surgical history and problem list.   Objective:   Vitals:   12/17/18 1531  BP: 113/71    Fetal Status:     Movement: Present     General:  Alert, oriented and cooperative. Patient is in no acute distress.  Respiratory: Normal respiratory effort, no problems with respiration noted  Mental Status: Normal mood and affect. Normal behavior. Normal judgment and thought content.  Rest of physical exam deferred due to type of  encounter  Imaging: No results found.  Assessment and Plan:  Pregnancy: G1P0 at [redacted]w[redacted]d 1. Supervision of low risk pregnancy, antepartum - Continue routine care - [place] next visit  2. Group B Streptococcus urinary tract infection affecting pregnancy in second trimester - PCN prophylaxis in labor  Preterm labor symptoms and general obstetric precautions including but not limited to vaginal bleeding, contractions, leaking of fluid and fetal movement were reviewed in detail with the patient. I discussed the assessment and treatment plan with the patient. The patient was provided an opportunity to ask questions and all were answered. The patient agreed with the plan and demonstrated an understanding of the instructions. The patient was advised to call back or seek an in-person office evaluation/go to MAU at [facility] for any urgent or concerning symptoms. Please refer to After Visit Summary for other counseling recommendations.   I provided 2 minutes of face-to-face time during this encounter.  Return in about 5 weeks (around 01/21/2019) for in person [place] swab.  Future Appointments  Date Time Provider [place]  01/22/2019  2:15 PM Tresea Mall, CNM [facility] [place], [place] for [facility], [place]

## 2018-12-17 NOTE — Progress Notes (Signed)
Attempted to contact pt at 1520; VM left stating I was calling to check pt in for virtual appt and will call again at 1530.  I connected with  Tina Gibbs on 12/17/18 at 1530 by MyChart and verified that I am speaking with the correct person using two identifiers.   I discussed the limitations, risks, security and privacy concerns of performing an evaluation and management service by telephone and the availability of in person appointments. I also discussed with the patient that there may be a patient responsible charge related to this service. The patient expressed understanding and agreed to proceed.  Annabell Howells, RN 12/17/2018  3:29 PM

## 2018-12-17 NOTE — Patient Instructions (Signed)

## 2019-01-02 ENCOUNTER — Telehealth: Payer: Self-pay | Admitting: General Practice

## 2019-01-02 NOTE — Telephone Encounter (Signed)
Patient called into front office requesting a call back because she is having pain. Called patient and asked for additional information about how she is feeling. Patient reports for the past three days she occasionally has sharp pains in her lower abdomen that feels like the baby is balling up. She also reports increase in vaginal discharge and recently passing her mucous plug. Patient reports the pains happen about 5-7 times a day. Discussed with patient it sounds like she is having braxton hicks contractions. Advised she make sure she is drinking plenty of water as dehydration can often cause those as well. Discussed losing her mucous plug usually doesn't mean anything unless she feels like her water has broken or she is having contractions every 4-5 minutes for longer than an hour in which she should go to the hospital to be seen. Patient verbalized understanding & states her next appt isn't until 1/5 and is that too far away. She states she also hasn't had an ultrasound since 24 weeks. Told patient because she and the baby are healthy and doing well, that is the normal time we would see her back for her next appt unless something is going on and we need to see her sooner. Told patient we usually do not do any additional ultrasounds past her previous one unless there is something concerning going on. Patient verbalized understanding and asked how we would know how big the baby is. Told patient the provider can feel her belly to get an estimate of baby's weight and that is usually more accurate than ultrasound. Patient verbalized understanding & had no questions.

## 2019-01-03 ENCOUNTER — Encounter: Payer: Self-pay | Admitting: *Deleted

## 2019-01-12 ENCOUNTER — Inpatient Hospital Stay (HOSPITAL_COMMUNITY)
Admission: EM | Admit: 2019-01-12 | Discharge: 2019-01-12 | Disposition: A | Discharge status: Home / Self Care | Payer: Medicaid Other | Attending: Family Medicine | Admitting: Family Medicine

## 2019-01-12 ENCOUNTER — Encounter (HOSPITAL_COMMUNITY): Payer: Self-pay | Admitting: Family Medicine

## 2019-01-12 DIAGNOSIS — O479 False labor, unspecified: Secondary | ICD-10-CM

## 2019-01-12 DIAGNOSIS — J45909 Unspecified asthma, uncomplicated: Secondary | ICD-10-CM | POA: Insufficient documentation

## 2019-01-12 DIAGNOSIS — R102 Pelvic and perineal pain: Secondary | ICD-10-CM | POA: Diagnosis not present

## 2019-01-12 DIAGNOSIS — O99513 Diseases of the respiratory system complicating pregnancy, third trimester: Secondary | ICD-10-CM | POA: Insufficient documentation

## 2019-01-12 DIAGNOSIS — Z8673 Personal history of transient ischemic attack (TIA), and cerebral infarction without residual deficits: Secondary | ICD-10-CM | POA: Diagnosis not present

## 2019-01-12 DIAGNOSIS — O99891 Other specified diseases and conditions complicating pregnancy: Secondary | ICD-10-CM | POA: Diagnosis not present

## 2019-01-12 DIAGNOSIS — E739 Lactose intolerance, unspecified: Secondary | ICD-10-CM | POA: Diagnosis not present

## 2019-01-12 DIAGNOSIS — Z91010 Allergy to peanuts: Secondary | ICD-10-CM | POA: Diagnosis not present

## 2019-01-12 DIAGNOSIS — O4703 False labor before 37 completed weeks of gestation, third trimester: Secondary | ICD-10-CM | POA: Diagnosis not present

## 2019-01-12 DIAGNOSIS — Z3A35 35 weeks gestation of pregnancy: Secondary | ICD-10-CM | POA: Diagnosis not present

## 2019-01-12 DIAGNOSIS — R109 Unspecified abdominal pain: Secondary | ICD-10-CM | POA: Insufficient documentation

## 2019-01-12 LAB — POCT FERN TEST: POCT Fern Test: NEGATIVE

## 2019-01-12 MED ORDER — ACETAMINOPHEN 325 MG PO TABS
650.0000 mg | ORAL_TABLET | Freq: Once | ORAL | Status: AC
  Administered 2019-01-12: 650 mg via ORAL
  Filled 2019-01-12: qty 2

## 2019-01-12 MED ORDER — TRACTION PELVIC BELT MISC: 1.0000 | Freq: Every day | 0 refills | Status: DC

## 2019-01-12 NOTE — MAU Note (Signed)
Patient reports to MAU from [facility] with vaginal pressure and urge to push. Transferring RN stated she was "bearing down." pt reports she is unsure if her water broke yesterday at 0830 pt states it was clear. Pt is having +FM. Ctx every 47min lasting 68min.

## 2019-01-12 NOTE — Discharge Instructions (Signed)
Braxton Hicks Contractions °Contractions of the uterus can occur throughout pregnancy, but they are not always a sign that you are in labor. You may have practice contractions called Braxton Hicks contractions. These false labor contractions are sometimes confused with true labor. °What are Braxton Hicks contractions? °Braxton Hicks contractions are tightening movements that occur in the muscles of the uterus before labor. Unlike true labor contractions, these contractions do not result in opening (dilation) and thinning of the cervix. Toward the end of pregnancy (32-34 weeks), Braxton Hicks contractions can happen more often and may become stronger. These contractions are sometimes difficult to tell apart from true labor because they can be very uncomfortable. You should not feel embarrassed if you go to the hospital with false labor. °Sometimes, the only way to tell if you are in true labor is for your health care provider to look for changes in the cervix. The health care provider will do a physical exam and may monitor your contractions. If you are not in true labor, the exam should show that your cervix is not dilating and your water has not broken. °If there are no other health problems associated with your pregnancy, it is completely safe for you to be sent home with false labor. You may continue to have Braxton Hicks contractions until you go into true labor. °How to tell the difference between true labor and false labor °True labor °· Contractions last 30-70 seconds. °· Contractions become very regular. °· Discomfort is usually felt in the top of the uterus, and it spreads to the lower abdomen and low back. °· Contractions do not go away with walking. °· Contractions usually become more intense and increase in frequency. °· The cervix dilates and gets thinner. °False labor °· Contractions are usually shorter and not as strong as true labor contractions. °· Contractions are usually irregular. °· Contractions  are often felt in the front of the lower abdomen and in the groin. °· Contractions may go away when you walk around or change positions while lying down. °· Contractions get weaker and are shorter-lasting as time goes on. °· The cervix usually does not dilate or become thin. °Follow these instructions at home: ° °· Take over-the-counter and prescription medicines only as told by your health care provider. °· Keep up with your usual exercises and follow other instructions from your health care provider. °· Eat and drink lightly if you think you are going into labor. °· If Braxton Hicks contractions are making you uncomfortable: °? Change your position from lying down or resting to walking, or change from walking to resting. °? Sit and rest in a tub of warm water. °? Drink enough fluid to keep your urine pale yellow. Dehydration may cause these contractions. °? Do slow and deep breathing several times an hour. °· Keep all follow-up prenatal visits as told by your health care provider. This is important. °Contact a health care provider if: °· You have a fever. °· You have continuous pain in your abdomen. °Get help right away if: °· Your contractions become stronger, more regular, and closer together. °· You have fluid leaking or gushing from your vagina. °· You pass blood-tinged mucus (bloody show). °· You have bleeding from your vagina. °· You have low back pain that you never had before. °· You feel your baby’s head pushing down and causing pelvic pressure. °· Your baby is not moving inside you as much as it used to. °Summary °· Contractions that occur before labor are   called Braxton Hicks contractions, false labor, or practice contractions.  Braxton Hicks contractions are usually shorter, weaker, farther apart, and less regular than true labor contractions. True labor contractions usually become progressively stronger and regular, and they become more frequent.  Manage discomfort from [facility] contractions  by changing position, resting in a warm bath, drinking plenty of water, or practicing deep breathing. This information is not intended to replace advice given to you by your health care provider. Make sure you discuss any questions you have with your health care provider. Document Released: 05/19/2016 Document Revised: 12/16/2016 Document Reviewed: 05/19/2016 Elsevier Patient Education  [employer].    Preterm Labor and Birth Information Pregnancy normally lasts 39-41 weeks. Preterm labor is when labor starts early. It starts before you have been pregnant for 37 whole weeks. What are the risk factors for preterm labor? Preterm labor is more likely to occur in women who:  Have an infection while pregnant.  Have a cervix that is short.  Have gone into preterm labor before.  Have had surgery on their cervix.  Are younger than age 36.  Are older than age 9.  Are African American.  Are pregnant with two or more babies.  Take street drugs while pregnant.  Smoke while pregnant.  Do not gain enough weight while pregnant.  Got pregnant right after another pregnancy. What are the symptoms of preterm labor? Symptoms of preterm labor include:  Cramps. The cramps may feel like the cramps some women get during their period. The cramps may happen with watery poop (diarrhea).  Pain in the belly (abdomen).  Pain in the lower back.  Regular contractions or tightening. It may feel like your belly is getting tighter.  Pressure in the lower belly that seems to get stronger.  More fluid (discharge) leaking from the vagina. The fluid may be watery or bloody.  Water breaking. Why is it important to notice signs of preterm labor? Babies who are born early may not be fully developed. They have a higher chance for:  Long-term heart problems.  Long-term lung problems.  Trouble controlling body systems, like breathing.  Bleeding in the brain.  A condition called cerebral  palsy.  Learning difficulties.  Death. These risks are highest for babies who are born before 71 weeks of pregnancy. How is preterm labor treated? Treatment depends on:  How long you were pregnant.  Your condition.  The health of your baby. Treatment may involve:  Having a stitch (suture) placed in your cervix. When you give birth, your cervix opens so the baby can come out. The stitch keeps the cervix from opening too soon.  Staying at the hospital.  Taking or getting medicines, such as: ? Hormone medicines. ? Medicines to stop contractions. ? Medicines to help the babys lungs develop. ? Medicines to prevent your baby from having cerebral palsy. What should I do if I am in preterm labor? If you think you are going into labor too soon, call your doctor right away. How can I prevent preterm labor?  Do not use any tobacco products. ? Examples of these are cigarettes, chewing tobacco, and e-cigarettes. ? If you need help quitting, ask your doctor.  Do not use street drugs.  Do not use any medicines unless you ask your doctor if they are safe for you.  Talk with your doctor before taking any herbal supplements.  Make sure you gain enough weight.  Watch for infection. If you think you might have an infection,  get it checked right away.  If you have gone into preterm labor before, tell your doctor. This information is not intended to replace advice given to you by your health care provider. Make sure you discuss any questions you have with your health care provider. Document Released: 04/01/2008 Document Revised: 04/27/2018 Document Reviewed: 05/27/2015 Elsevier Patient Education  2020 [employer].
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
## 2019-01-18 NOTE — L&D Delivery Note (Addendum)
OB/GYN Faculty Practice Delivery Note  Tina Gibbs is a 19 y.o. G1P0 s/p cytotec, FB, pitocin, and AROM at [redacted]w[redacted]d. She was admitted for IOL d/t NRFHT.  ROM: 6h 60m with clear fluid GBS Status: positive Maximum Maternal Temperature: 98.7*F  Labor Progress: Patient had an IOL d/t NRFHT beginning 1/21 at 1900 s/p cytotec x5, FB, pitocin started at 1800 on 1/22, AROM at [place] on 1/23 at 6 cm and then progressed to completion by 0400 1/23.  Delivery Date/Time: 02/09/2019 0423 Delivery: Called to room and patient was complete and pushing. Head delivered right occiput anterior. No nuchal cord present. Shoulder and body delivered in usual fashion. Infant with spontaneous cry, placed on mother's abdomen, dried and stimulated. Cord clamped x 2 after 1-minute delay, and cut by FOB. Cord blood drawn. Placenta delivered spontaneously with gentle cord traction. Fundus and lower uterine segment were boggy with brisk bleeding despite bimanual massage and Pitocin. Methergine 2mg  IM, cytotec 1000mg  per rectum, and 1 g TXA given. Fundus and lower uterine segment subsequently firmed up after with continuous bimanual massage. Labia, perineum, vagina, and cervix inspected with only a hemostatic right vaginal wall abrasion noted.  Placenta: intact, 3 vessel, to labor and delivery. Complications: uterine atony, s/p methergine, cytotec, TXA, and pitocin Lacerations: none EBL: 79mL Analgesia: epidural, no local  Postpartum Planning [x]  message to sent to schedule follow-up  [x]  vaccines UTD  Infant: female  APGARs 9,9  weight pending  [name], MD [place], PGY-1 02/09/2019, 4:57 AM  GME ATTESTATION:  I saw and evaluated the patient. I was gowned and gloved throughout the entire delivery. I agree with the findings and the plan of care as documented in the resident's note.  Merilyn Baba, DO OB Fellow, [place] for [place] 02/09/2019  5:23 AM

## 2019-01-21 ENCOUNTER — Encounter: Payer: Self-pay | Admitting: *Deleted

## 2019-01-22 ENCOUNTER — Encounter: Payer: Self-pay | Admitting: Advanced Practice Midwife

## 2019-01-22 ENCOUNTER — Other Ambulatory Visit (HOSPITAL_COMMUNITY)
Admission: RE | Admit: 2019-01-22 | Discharge: 2019-01-22 | Disposition: A | Discharge status: Home / Self Care | Payer: Medicaid Other | Source: Ambulatory Visit | Attending: Advanced Practice Midwife | Admitting: Advanced Practice Midwife

## 2019-01-22 ENCOUNTER — Ambulatory Visit (INDEPENDENT_AMBULATORY_CARE_PROVIDER_SITE_OTHER): Payer: Medicaid Other | Admitting: Advanced Practice Midwife

## 2019-01-22 ENCOUNTER — Other Ambulatory Visit: Payer: Self-pay

## 2019-01-22 VITALS — BP 126/82 | HR 102 | Temp 98.1°F | Wt 149.6 lb

## 2019-01-22 DIAGNOSIS — O0990 Supervision of high risk pregnancy, unspecified, unspecified trimester: Secondary | ICD-10-CM | POA: Diagnosis not present

## 2019-01-22 DIAGNOSIS — R300 Dysuria: Secondary | ICD-10-CM

## 2019-01-22 DIAGNOSIS — Z3A36 36 weeks gestation of pregnancy: Secondary | ICD-10-CM

## 2019-01-22 DIAGNOSIS — O0993 Supervision of high risk pregnancy, unspecified, third trimester: Secondary | ICD-10-CM

## 2019-01-22 DIAGNOSIS — O26893 Other specified pregnancy related conditions, third trimester: Secondary | ICD-10-CM

## 2019-01-22 NOTE — Progress Notes (Signed)
Pt reports one episode of small amount of bright red vaginal bleeding yesterday after sex. Pt also reports decreased FM for several days however had good FM today. Per further discussion, pt reports the type of FM which is normal during late 3rd trimester. Pt states she has cloudy urine, burning with urination and has odor. Specimen obtained for UA.

## 2019-01-22 NOTE — Progress Notes (Signed)
   PRENATAL VISIT NOTE  Subjective:  Tina Gibbs is a 19 y.o. G1P0 at [redacted]w[redacted]d being seen today for ongoing prenatal care.  She is currently monitored for the following issues for this low-risk pregnancy and has Unspecified asthma(493.90); Seasonal allergies; ADHD (attention deficit hyperactivity disorder); Influenza vaccination declined; Supervision of high risk pregnancy, antepartum; Asthma; Anxiety and depression; UTI in pregnancy; GBS (group B streptococcus) UTI complicating pregnancy; and Anemia in pregnancy on their problem list.  Patient reports occasional contractions.  Contractions: Irritability. Vag. Bleeding: Small.  Movement: Present. Denies leaking of fluid.   The following portions of the patient's history were reviewed and updated as appropriate: allergies, current medications, past family history, past medical history, past social history, past surgical history and problem list.   Objective:   Vitals:   01/22/19 1437  BP: 126/82  Pulse: (!) 102  Temp: 98.1 F (36.7 C)  Weight: 149 lb 9.6 oz (67.9 kg)    Fetal Status: Fetal Heart Rate (bpm): 140 Fundal Height: 36 cm Movement: Present     General:  Alert, oriented and cooperative. Patient is in no acute distress.  Skin: Skin is warm and dry. No rash noted.   Cardiovascular: Normal heart rate noted  Respiratory: Normal respiratory effort, no problems with respiration noted  Abdomen: Soft, gravid, appropriate for gestational age.  Pain/Pressure: Present     Pelvic: Cervical exam performed Dilation: Closed Effacement (%): Thick Station: -3  Extremities: Normal range of motion.  Edema: Trace  Mental Status: Normal mood and affect. Normal behavior. Normal judgment and thought content.   Assessment and Plan:  Pregnancy: G1P0 at [redacted]w[redacted]d 1. Supervision of high risk pregnancy, antepartum - Urine culture sent  - GC/Chlamydia probe amp ([place])not at [facility]  Term labor symptoms and general obstetric precautions including  but not limited to vaginal bleeding, contractions, leaking of fluid and fetal movement were reviewed in detail with the patient. Please refer to After Visit Summary for other counseling recommendations.   Return in about 1 week (around 01/29/2019) for virtual visit .  No future appointments.  Marcille Buffy DNP, CNM  01/22/19  3:03 PM

## 2019-01-23 LAB — POCT URINALYSIS DIP (DEVICE)
Bilirubin Urine: NEGATIVE
Glucose, UA: NEGATIVE mg/dL
Hgb urine dipstick: NEGATIVE
Ketones, ur: NEGATIVE mg/dL
Nitrite: NEGATIVE
Protein, ur: NEGATIVE mg/dL
Specific Gravity, Urine: 1.02 (ref 1.005–1.030)
Urobilinogen, UA: 0.2 mg/dL (ref 0.0–1.0)
pH: 7 (ref 5.0–8.0)

## 2019-01-23 LAB — GC/CHLAMYDIA PROBE AMP (~~LOC~~) NOT AT ARMC
Chlamydia: NEGATIVE
Comment: NEGATIVE
Comment: NORMAL
Neisseria Gonorrhea: NEGATIVE

## 2019-01-25 LAB — URINE CULTURE, OB REFLEX

## 2019-01-25 LAB — CULTURE, OB URINE

## 2019-01-29 ENCOUNTER — Telehealth (INDEPENDENT_AMBULATORY_CARE_PROVIDER_SITE_OTHER): Payer: Medicaid Other | Admitting: Student

## 2019-01-29 ENCOUNTER — Other Ambulatory Visit: Payer: Self-pay

## 2019-01-29 ENCOUNTER — Telehealth: Payer: Self-pay | Admitting: Lactation Services

## 2019-01-29 DIAGNOSIS — Z3A37 37 weeks gestation of pregnancy: Secondary | ICD-10-CM

## 2019-01-29 DIAGNOSIS — O0993 Supervision of high risk pregnancy, unspecified, third trimester: Secondary | ICD-10-CM

## 2019-01-29 DIAGNOSIS — O0990 Supervision of high risk pregnancy, unspecified, unspecified trimester: Secondary | ICD-10-CM

## 2019-01-29 MED ORDER — CEPHALEXIN 500 MG PO CAPS: 500.0000 mg | ORAL_CAPSULE | Freq: Two times a day (BID) | ORAL | 0 refills | Status: DC

## 2019-01-29 NOTE — Progress Notes (Signed)
I connected with@ on 01/29/19 at  1:15 PM EST by: My Chartand verified that I am speaking with the correct person using two identifiers.  Patient is located at home and provider is located at [place].     The purpose of this virtual visit is to provide medical care while limiting exposure to the novel coronavirus. I discussed the limitations, risks, security and privacy concerns of performing an evaluation and management service by My Chart and the availability of in person appointments. I also discussed with the patient that there may be a patient responsible charge related to this service. By engaging in this virtual visit, you consent to the provision of healthcare.  Additionally, you authorize for your insurance to be billed for the services provided during this visit.  The patient expressed understanding and agreed to proceed.  The following staff members participated in the virtual visit:  Diane    PRENATAL VISIT NOTE  Subjective:  Tina Gibbs is a 19 y.o. G1P0 at [redacted]w[redacted]d  for phone visit for ongoing prenatal care.  She is currently monitored for the following issues for this low-risk pregnancy and has Unspecified asthma(493.90); Seasonal allergies; ADHD (attention deficit hyperactivity disorder); Influenza vaccination declined; Supervision of high risk pregnancy, antepartum; Asthma; Anxiety and depression; UTI in pregnancy; GBS (group B streptococcus) UTI complicating pregnancy; and Anemia in pregnancy on their problem list.  Patient reports backache. She had some discharge last night after intercourse, but none today.  Contractions: Not present. Vag. Bleeding: None.  Movement: Present. Denies leaking of fluid.   The following portions of the patient's history were reviewed and updated as appropriate: allergies, current medications, past family history, past medical history, past social history, past surgical history and problem list.   Objective:   Vitals:   01/29/19 1314  BP: 118/82   Self-Obtained  Fetal Status:     Movement: Present     Assessment and Plan:  Pregnancy: G1P0 at [redacted]w[redacted]d 1. Supervision of high risk pregnancy, antepartum -she will call her pediatrician and make sure they are taking insurance (does not have name right now) -reassured her of the normalcy of increased discharge at the end of pregnancy, reviewed warning signs and when to come to [facility].  Preterm labor symptoms and general obstetric precautions including but not limited to vaginal bleeding, contractions, leaking of fluid and fetal movement were reviewed in detail with the patient.  No follow-ups on file.  Future Appointments  Date Time Provider [place]  02/06/2019  3:55 PM Danielle Rankin [facility] [place]  02/12/2019  4:15 PM Ardean Larsen, Mervyn Skeeters, CNM [facility] [place]     Time spent on virtual visit: 12 minutes  [place], [place]

## 2019-01-29 NOTE — Telephone Encounter (Signed)
Pharmacy called for clarification for dosage and timing of Keflex. Message sent to Marcille Buffy, CNM to clarify.   Pharmacy is [facility] on [address] [phone].

## 2019-01-29 NOTE — Telephone Encounter (Signed)
Clarification from Marcille Buffy that Keflex is to be 500 mg BID for 7 days. Called and spoke with Pharmacist and verified prescription dosage and quantity.

## 2019-01-29 NOTE — Addendum Note (Signed)
Addended by: Marcille Buffy D on: 01/29/2019 09:29 AM   Modules accepted: Orders

## 2019-02-06 ENCOUNTER — Other Ambulatory Visit: Payer: Self-pay

## 2019-02-06 ENCOUNTER — Encounter: Payer: Self-pay | Admitting: Medical

## 2019-02-06 ENCOUNTER — Telehealth (INDEPENDENT_AMBULATORY_CARE_PROVIDER_SITE_OTHER): Payer: Medicaid Other | Admitting: Medical

## 2019-02-06 VITALS — BP 123/77 | HR 73

## 2019-02-06 DIAGNOSIS — O0993 Supervision of high risk pregnancy, unspecified, third trimester: Secondary | ICD-10-CM

## 2019-02-06 DIAGNOSIS — Z3A38 38 weeks gestation of pregnancy: Secondary | ICD-10-CM

## 2019-02-06 DIAGNOSIS — O2343 Unspecified infection of urinary tract in pregnancy, third trimester: Secondary | ICD-10-CM | POA: Diagnosis not present

## 2019-02-06 DIAGNOSIS — O2342 Unspecified infection of urinary tract in pregnancy, second trimester: Secondary | ICD-10-CM

## 2019-02-06 DIAGNOSIS — B951 Streptococcus, group B, as the cause of diseases classified elsewhere: Secondary | ICD-10-CM

## 2019-02-06 DIAGNOSIS — O99013 Anemia complicating pregnancy, third trimester: Secondary | ICD-10-CM

## 2019-02-06 DIAGNOSIS — O0990 Supervision of high risk pregnancy, unspecified, unspecified trimester: Secondary | ICD-10-CM

## 2019-02-06 NOTE — Progress Notes (Signed)
I connected with  [name] on 02/06/19 at  3:55 PM EST by telephone and verified that I am speaking with the correct person using two identifiers.   I discussed the limitations, risks, security and privacy concerns of performing an evaluation and management service by telephone and the availability of in person appointments. I also discussed with the patient that there may be a patient responsible charge related to this service. The patient expressed understanding and agreed to proceed.  Derinda Late, RN 02/06/2019  4:03 PM

## 2019-02-06 NOTE — Progress Notes (Signed)
I connected with Tina Gibbs  on 02/06/19 at  3:55 PM EST by: MyChart and verified that I am speaking with the correct person using two identifiers.  Patient is located at home and provider is located at [facility].     The purpose of this virtual visit is to provide medical care while limiting exposure to the novel coronavirus. I discussed the limitations, risks, security and privacy concerns of performing an evaluation and management service by MyChart and the availability of in person appointments. I also discussed with the patient that there may be a patient responsible charge related to this service. By engaging in this virtual visit, you consent to the provision of healthcare.  Additionally, you authorize for your insurance to be billed for the services provided during this visit.  The patient expressed understanding and agreed to proceed.  The following staff members participated in the virtual visit:  Derinda Late, RN    PRENATAL VISIT NOTE  Subjective:  Tina Gibbs is a 19 y.o. G1P0 at [redacted]w[redacted]d  for phone visit for ongoing prenatal care.  She is currently monitored for the following issues for this low-risk pregnancy and has Unspecified asthma(493.90); Seasonal allergies; ADHD (attention deficit hyperactivity disorder); Influenza vaccination declined; Supervision of high risk pregnancy, antepartum; Asthma; Anxiety and depression; UTI in pregnancy; GBS (group B streptococcus) UTI complicating pregnancy; and Anemia in pregnancy on their problem list.  Patient reports occasional contractions and pelvic pressure.  Contractions: Irritability. Vag. Bleeding: None.  Movement: Present. Denies leaking of fluid.   The following portions of the patient's history were reviewed and updated as appropriate: allergies, current medications, past family history, past medical history, past social history, past surgical history and problem list.   Objective:   Vitals:   02/06/19 1604  BP: 123/77   Pulse: 73   Self-Obtained  Fetal Status:     Movement: Present     Assessment and Plan:  Pregnancy: G1P0 at [redacted]w[redacted]d 1. Anemia during pregnancy in third trimester  2. Supervision of high risk pregnancy, antepartum - Contractions occasionally, no bleeding, LOF - Patient has chosen Peds  3. Group B Streptococcus urinary tract infection affecting pregnancy in second trimester - Treat in labor, explained to the patient   Term labor symptoms and general obstetric precautions including but not limited to vaginal bleeding, contractions, leaking of fluid and fetal movement were reviewed in detail with the patient.  Return in about 1 week (around 02/13/2019) for LOB, Virtual.  Future Appointments  Date Time Provider [place]  02/14/2019  8:15 AM [facility]  02/14/2019  9:15 AM Rasch, Artist Pais, NP [facility]     Time spent on virtual visit: 10 minutes  Kerry Hough, PA-C

## 2019-02-06 NOTE — Patient Instructions (Signed)
Fetal Movement Counts Patient Name: ________________________________________________ Patient Due Date: ____________________ What is a fetal movement count?  A fetal movement count is the number of times that you feel your baby move during a certain amount of time. This may also be called a fetal kick count. A fetal movement count is recommended for every pregnant woman. You may be asked to start counting fetal movements as early as week 28 of your pregnancy. Pay attention to when your baby is most active. You may notice your baby's sleep and wake cycles. You may also notice things that make your baby move more. You should do a fetal movement count:  When your baby is normally most active.  At the same time each day. A good time to count movements is while you are resting, after having something to eat and drink. How do I count fetal movements? 1. Find a quiet, comfortable area. Sit, or lie down on your side. 2. Write down the date, the start time and stop time, and the number of movements that you felt between those two times. Take this information with you to your health care visits. 3. Write down your start time when you feel the first movement. 4. Count kicks, flutters, swishes, rolls, and jabs. You should feel at least 10 movements. 5. You may stop counting after you have felt 10 movements, or if you have been counting for 2 hours. Write down the stop time. 6. If you do not feel 10 movements in 2 hours, contact your health care provider for further instructions. Your health care provider may want to do additional tests to assess your baby's well-being. Contact a health care provider if:  You feel fewer than 10 movements in 2 hours.  Your baby is not moving like he or she usually does. Date: ____________ Start time: ____________ Stop time: ____________ Movements: ____________ Date: ____________ Start time: ____________ Stop time: ____________ Movements: ____________ Date: ____________  Start time: ____________ Stop time: ____________ Movements: ____________ Date: ____________ Start time: ____________ Stop time: ____________ Movements: ____________ Date: ____________ Start time: ____________ Stop time: ____________ Movements: ____________ Date: ____________ Start time: ____________ Stop time: ____________ Movements: ____________ Date: ____________ Start time: ____________ Stop time: ____________ Movements: ____________ Date: ____________ Start time: ____________ Stop time: ____________ Movements: ____________ Date: ____________ Start time: ____________ Stop time: ____________ Movements: ____________ This information is not intended to replace advice given to you by your health care provider. Make sure you discuss any questions you have with your health care provider. Document Revised: 08/23/2018 Document Reviewed: 08/23/2018 Elsevier Patient Education  2020 Else[employer] of the uterus can occur throughout pregnancy, but they are not always a sign that you are in labor. You may have practice contractions called Braxton Hicks contractions. These false labor contractions are sometimes confused with true labor. What are [place] contractions? Braxton Hicks contractions are tightening movements that occur in the muscles of the uterus before labor. Unlike true labor contractions, these contractions do not result in opening (dilation) and thinning of the cervix. Toward the end of pregnancy (32-34 weeks), Braxton Hicks contractions can happen more often and may become stronger. These contractions are sometimes difficult to tell apart from true labor because they can be very uncomfortable. You should not feel embarrassed if you go to the hospital with false labor. Sometimes, the only way to tell if you are in true labor is for your health care provider to look for changes in the cervix. The health care provider  will do a physical exam and may  monitor your contractions. If you are not in true labor, the exam should show that your cervix is not dilating and your water has not broken. If there are no other health problems associated with your pregnancy, it is completely safe for you to be sent home with false labor. You may continue to have Braxton Hicks contractions until you go into true labor. How to tell the difference between true labor and false labor True labor  Contractions last 30-70 seconds.  Contractions become very regular.  Discomfort is usually felt in the top of the uterus, and it spreads to the lower abdomen and low back.  Contractions do not go away with walking.  Contractions usually become more intense and increase in frequency.  The cervix dilates and gets thinner. False labor  Contractions are usually shorter and not as strong as true labor contractions.  Contractions are usually irregular.  Contractions are often felt in the front of the lower abdomen and in the groin.  Contractions may go away when you walk around or change positions while lying down.  Contractions get weaker and are shorter-lasting as time goes on.  The cervix usually does not dilate or become thin. Follow these instructions at home:   Take over-the-counter and prescription medicines only as told by your health care provider.  Keep up with your usual exercises and follow other instructions from your health care provider.  Eat and drink lightly if you think you are going into labor.  If Braxton Hicks contractions are making you uncomfortable: ? Change your position from lying down or resting to walking, or change from walking to resting. ? Sit and rest in a tub of warm water. ? Drink enough fluid to keep your urine pale yellow. Dehydration may cause these contractions. ? Do slow and deep breathing several times an hour.  Keep all follow-up prenatal visits as told by your health care provider. This is important. Contact a  health care provider if:  You have a fever.  You have continuous pain in your abdomen. Get help right away if:  Your contractions become stronger, more regular, and closer together.  You have fluid leaking or gushing from your vagina.  You pass blood-tinged mucus (bloody show).  You have bleeding from your vagina.  You have low back pain that you never had before.  You feel your baby's head pushing down and causing pelvic pressure.  Your baby is not moving inside you as much as it used to. Summary  Contractions that occur before labor are called Braxton Hicks contractions, false labor, or practice contractions.  Braxton Hicks contractions are usually shorter, weaker, farther apart, and less regular than true labor contractions. True labor contractions usually become progressively stronger and regular, and they become more frequent.  Manage discomfort from Braxton Hicks contractions by changing position, resting in a warm bath, drinking plenty of water, or practicing deep breathing. This information is not intended to replace advice given to you by your health care provider. Make sure you discuss any questions you have with your health care provider. Document Revised: 12/16/2016 Document Reviewed: 05/19/2016 Elsevier Patient Education  2020 Elsevier Inc.  

## 2019-02-07 ENCOUNTER — Inpatient Hospital Stay (HOSPITAL_COMMUNITY)
Admission: AD | Admit: 2019-02-07 | Discharge: 2019-02-10 | DRG: 805 | Disposition: A | Discharge status: Home / Self Care | Payer: Medicaid Other | Attending: Family Medicine | Admitting: Family Medicine

## 2019-02-07 ENCOUNTER — Encounter (HOSPITAL_COMMUNITY): Payer: Self-pay | Admitting: Obstetrics & Gynecology

## 2019-02-07 ENCOUNTER — Other Ambulatory Visit: Payer: Self-pay

## 2019-02-07 DIAGNOSIS — O99344 Other mental disorders complicating childbirth: Secondary | ICD-10-CM | POA: Diagnosis present

## 2019-02-07 DIAGNOSIS — O368390 Maternal care for abnormalities of the fetal heart rate or rhythm, unspecified trimester, not applicable or unspecified: Secondary | ICD-10-CM

## 2019-02-07 DIAGNOSIS — D649 Anemia, unspecified: Secondary | ICD-10-CM | POA: Diagnosis present

## 2019-02-07 DIAGNOSIS — Z20822 Contact with and (suspected) exposure to covid-19: Secondary | ICD-10-CM | POA: Diagnosis present

## 2019-02-07 DIAGNOSIS — Z3689 Encounter for other specified antenatal screening: Secondary | ICD-10-CM | POA: Diagnosis not present

## 2019-02-07 DIAGNOSIS — O99824 Streptococcus B carrier state complicating childbirth: Secondary | ICD-10-CM | POA: Diagnosis present

## 2019-02-07 DIAGNOSIS — Z3A39 39 weeks gestation of pregnancy: Secondary | ICD-10-CM | POA: Diagnosis not present

## 2019-02-07 DIAGNOSIS — O9902 Anemia complicating childbirth: Secondary | ICD-10-CM | POA: Diagnosis present

## 2019-02-07 DIAGNOSIS — B951 Streptococcus, group B, as the cause of diseases classified elsewhere: Secondary | ICD-10-CM | POA: Diagnosis present

## 2019-02-07 DIAGNOSIS — J45909 Unspecified asthma, uncomplicated: Secondary | ICD-10-CM | POA: Diagnosis present

## 2019-02-07 DIAGNOSIS — O0990 Supervision of high risk pregnancy, unspecified, unspecified trimester: Secondary | ICD-10-CM

## 2019-02-07 DIAGNOSIS — O9952 Diseases of the respiratory system complicating childbirth: Secondary | ICD-10-CM | POA: Diagnosis present

## 2019-02-07 DIAGNOSIS — O99013 Anemia complicating pregnancy, third trimester: Secondary | ICD-10-CM

## 2019-02-07 DIAGNOSIS — F419 Anxiety disorder, unspecified: Secondary | ICD-10-CM | POA: Diagnosis present

## 2019-02-07 DIAGNOSIS — F329 Major depressive disorder, single episode, unspecified: Secondary | ICD-10-CM | POA: Diagnosis present

## 2019-02-07 DIAGNOSIS — O368130 Decreased fetal movements, third trimester, not applicable or unspecified: Secondary | ICD-10-CM | POA: Diagnosis present

## 2019-02-07 DIAGNOSIS — O2340 Unspecified infection of urinary tract in pregnancy, unspecified trimester: Secondary | ICD-10-CM | POA: Diagnosis present

## 2019-02-07 DIAGNOSIS — F32A Depression, unspecified: Secondary | ICD-10-CM | POA: Diagnosis present

## 2019-02-07 LAB — COMPREHENSIVE METABOLIC PANEL
ALT: 13 U/L (ref 0–44)
AST: 17 U/L (ref 15–41)
Albumin: 3 g/dL — ABNORMAL LOW (ref 3.5–5.0)
Alkaline Phosphatase: 126 U/L (ref 38–126)
Anion gap: 7 (ref 5–15)
BUN: 7 mg/dL (ref 6–20)
CO2: 22 mmol/L (ref 22–32)
Calcium: 8.6 mg/dL — ABNORMAL LOW (ref 8.9–10.3)
Chloride: 107 mmol/L (ref 98–111)
Creatinine, Ser: 0.62 mg/dL (ref 0.44–1.00)
GFR calc Af Amer: >60 mL/min (ref >60)
GFR calc non Af Amer: >60 mL/min (ref >60)
Glucose, Bld: 100 mg/dL — ABNORMAL HIGH (ref 70–99)
Potassium: 4.1 mmol/L (ref 3.5–5.1)
Sodium: 136 mmol/L (ref 135–145)
Total Bilirubin: 0.3 mg/dL (ref 0.3–1.2)
Total Protein: 5.8 g/dL — ABNORMAL LOW (ref 6.5–8.1)

## 2019-02-07 LAB — CBC
HCT: 34.5 % — ABNORMAL LOW (ref 36.0–46.0)
Hemoglobin: 11.4 g/dL — ABNORMAL LOW (ref 12.0–15.0)
MCH: 32.3 pg (ref 26.0–34.0)
MCHC: 33 g/dL (ref 30.0–36.0)
MCV: 97.7 fL (ref 80.0–100.0)
Platelets: 164 10*3/uL (ref 150–400)
RBC: 3.53 MIL/uL — ABNORMAL LOW (ref 3.87–5.11)
RDW: 13.9 % (ref 11.5–15.5)
WBC: 9.1 10*3/uL (ref 4.0–10.5)
nRBC: 0 % (ref 0.0–0.2)

## 2019-02-07 LAB — TYPE AND SCREEN
ABO/RH(D): O POS
Antibody Screen: NEGATIVE

## 2019-02-07 LAB — SARS CORONAVIRUS 2 (TAT 6-24 HRS): SARS Coronavirus 2: NEGATIVE

## 2019-02-07 LAB — PROTEIN / CREATININE RATIO, URINE
Creatinine, Urine: 199.65 mg/dL
Protein Creatinine Ratio: 0.09 mg/mg{Cre} (ref 0.00–0.15)
Total Protein, Urine: 18 mg/dL

## 2019-02-07 LAB — POCT FERN TEST: POCT Fern Test: NEGATIVE

## 2019-02-07 MED ORDER — BUTALBITAL-APAP-CAFFEINE 50-325-40 MG PO TABS
1.0000 | ORAL_TABLET | Freq: Once | ORAL | Status: AC
  Administered 2019-02-07: 19:00:00 1 via ORAL
  Filled 2019-02-07: qty 1

## 2019-02-07 MED ORDER — SOD CITRATE-CITRIC ACID 500-334 MG/5ML PO SOLN: 30.0000 mL | ORAL | Status: DC | PRN

## 2019-02-07 MED ORDER — PENICILLIN G POT IN DEXTROSE 60000 UNIT/ML IV SOLN
3.0000 10*6.[IU] | INTRAVENOUS | Status: DC
  Administered 2019-02-08 – 2019-02-09 (×7): 3 10*6.[IU] via INTRAVENOUS
  Filled 2019-02-07 (×7): qty 50

## 2019-02-07 MED ORDER — MISOPROSTOL 50MCG HALF TABLET
50.0000 ug | ORAL_TABLET | ORAL | Status: DC | PRN
  Administered 2019-02-07 – 2019-02-08 (×5): 50 ug via BUCCAL
  Filled 2019-02-07 (×6): qty 1

## 2019-02-07 MED ORDER — ACETAMINOPHEN 325 MG PO TABS: 650.0000 mg | ORAL_TABLET | ORAL | Status: DC | PRN

## 2019-02-07 MED ORDER — OXYTOCIN BOLUS FROM INFUSION
500.0000 mL | Freq: Once | INTRAVENOUS | Status: AC
  Administered 2019-02-09: 05:00:00 500 mL via INTRAVENOUS

## 2019-02-07 MED ORDER — LACTATED RINGERS IV SOLN: INTRAVENOUS | Status: DC

## 2019-02-07 MED ORDER — LIDOCAINE HCL (PF) 1 % IJ SOLN: 30.0000 mL | INTRAMUSCULAR | Status: DC | PRN

## 2019-02-07 MED ORDER — SODIUM CHLORIDE 0.9 % IV SOLN
5.0000 10*6.[IU] | Freq: Once | INTRAVENOUS | Status: AC
  Administered 2019-02-07: 19:00:00 5 10*6.[IU] via INTRAVENOUS
  Filled 2019-02-07: qty 5

## 2019-02-07 MED ORDER — OXYTOCIN 40 UNITS IN NORMAL SALINE INFUSION - SIMPLE MED: 2.5000 [IU]/h | INTRAVENOUS | Status: DC

## 2019-02-07 MED ORDER — TERBUTALINE SULFATE 1 MG/ML IJ SOLN: 0.2500 mg | Freq: Once | INTRAMUSCULAR | Status: DC | PRN

## 2019-02-07 MED ORDER — ONDANSETRON HCL 4 MG/2ML IJ SOLN
4.0000 mg | Freq: Four times a day (QID) | INTRAMUSCULAR | Status: DC | PRN
  Administered 2019-02-09 (×2): 4 mg via INTRAVENOUS
  Filled 2019-02-07 (×2): qty 2

## 2019-02-07 MED ORDER — LACTATED RINGERS IV SOLN: 500.0000 mL | INTRAVENOUS | Status: DC | PRN

## 2019-02-07 NOTE — Progress Notes (Signed)
Labor Progress Note  Subjective: Introduced self to patient. Pt doing well. Pt denies contractions. Occasional cramping. Reports good fetal movement.   Objective: BP 122/67   Pulse 89   Temp 98 F (36.7 C) (Oral)   Resp 16   Ht 5\' 1"  (1.549 m)   Wt 67.9 kg   LMP 05/10/2018   BMI 28.28 kg/m   Fetal Well-Being: - FHR 140, moderate variability, +accels, no decels  Toco: - 2-5 minutes  Dilation: Closed Presentation: Vertex Exam by:: amanda, cnm  Assessment and Plan: 19 y.o. G1P0 [redacted]w[redacted]d admitted for induction of labor for NRNST.  Labor:  - Buccal cytotec x1 @1900  - Plan for foley bulb insertion at later time - GBS positive; first dose PCN infusing - Pain control: not planning anything at this time    Maryagnes Amos, SNM 8:55 PM
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
## 2019-02-07 NOTE — MAU Note (Signed)
Pt is a G1P0 at 39.0 weeks c/o headache and blurry vision that started 3 days ago that has been unrelieved by medications or other interventions.  Pt also c/o a "gush" that wet her underwear twice in the last 24, decreased fetal movement and pelvic pressure.  She reports intermittent cramping similar to period cramps.  She states she is high risk because she had a stroke at the age of 28 and that she had high blood pressure early in the pregnancy but that it has resolved.    Pt has several non-OB complications but is not currently being followed for any OB specific issues.
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
## 2019-02-08 ENCOUNTER — Inpatient Hospital Stay (HOSPITAL_COMMUNITY): Payer: Medicaid Other | Admitting: Anesthesiology

## 2019-02-08 DIAGNOSIS — Z3689 Encounter for other specified antenatal screening: Secondary | ICD-10-CM

## 2019-02-08 DIAGNOSIS — Z3A39 39 weeks gestation of pregnancy: Secondary | ICD-10-CM

## 2019-02-08 LAB — RPR: RPR Ser Ql: NONREACTIVE

## 2019-02-08 MED ORDER — EPHEDRINE 5 MG/ML INJ: 10.0000 mg | INTRAVENOUS | Status: DC | PRN

## 2019-02-08 MED ORDER — PHENYLEPHRINE 40 MCG/ML (10ML) SYRINGE FOR IV PUSH (FOR BLOOD PRESSURE SUPPORT): 80.0000 ug | PREFILLED_SYRINGE | INTRAVENOUS | Status: DC | PRN

## 2019-02-08 MED ORDER — SODIUM CHLORIDE (PF) 0.9 % IJ SOLN
INTRAMUSCULAR | Status: DC | PRN
  Administered 2019-02-08: 12 mL/h via EPIDURAL

## 2019-02-08 MED ORDER — LIDOCAINE HCL (PF) 1 % IJ SOLN
INTRAMUSCULAR | Status: DC | PRN
  Administered 2019-02-08: 10 mL via EPIDURAL
  Administered 2019-02-08: 2 mL via EPIDURAL

## 2019-02-08 MED ORDER — FENTANYL CITRATE (PF) 100 MCG/2ML IJ SOLN
100.0000 ug | INTRAMUSCULAR | Status: DC | PRN
  Administered 2019-02-08: 10:00:00 100 ug via INTRAVENOUS
  Filled 2019-02-08: qty 2

## 2019-02-08 MED ORDER — TERBUTALINE SULFATE 1 MG/ML IJ SOLN: 0.2500 mg | Freq: Once | INTRAMUSCULAR | Status: DC | PRN

## 2019-02-08 MED ORDER — PHENYLEPHRINE 40 MCG/ML (10ML) SYRINGE FOR IV PUSH (FOR BLOOD PRESSURE SUPPORT)
80.0000 ug | PREFILLED_SYRINGE | INTRAVENOUS | Status: DC | PRN
  Filled 2019-02-08: qty 10

## 2019-02-08 MED ORDER — OXYTOCIN 40 UNITS IN NORMAL SALINE INFUSION - SIMPLE MED
1.0000 m[IU]/min | INTRAVENOUS | Status: DC
  Administered 2019-02-08: 2 m[IU]/min via INTRAVENOUS
  Filled 2019-02-08: qty 1000

## 2019-02-08 MED ORDER — FENTANYL-BUPIVACAINE-NACL 0.5-0.125-0.9 MG/250ML-% EP SOLN
12.0000 mL/h | EPIDURAL | Status: DC | PRN
  Filled 2019-02-08: qty 250

## 2019-02-08 MED ORDER — LACTATED RINGERS IV SOLN: 500.0000 mL | Freq: Once | INTRAVENOUS | Status: DC

## 2019-02-08 MED ORDER — DIPHENHYDRAMINE HCL 50 MG/ML IJ SOLN: 12.5000 mg | INTRAMUSCULAR | Status: DC | PRN

## 2019-02-08 NOTE — Progress Notes (Signed)
LABOR PROGRESS NOTE  Tina Gibbs is a 19 y.o. G1P0 at [redacted]w[redacted]d admitted for IOL for NRFHT  Subjective: Patient feeling more uncomfortable, reports increased vaginal pressure. Foley bulb still in place. Has been tolerating Cytotec without difficulty, and pain well controlled. No concerns at this time.  Objective: BP 120/63   Pulse 62   Temp 98.1 F (36.7 C) (Oral)   Resp 18   Ht 5\' 1"  (1.549 m)   Wt 67.9 kg   LMP 05/10/2018   BMI 28.28 kg/m   Dilation: Fingertip Effacement (%): 60 Station: -2 Presentation: Vertex Exam by:: Dr. Marice Potter Fetal monitoring: Baseline: 135 bpm, Variability: Good {> 6 bpm), Accelerations: Reactive and Decelerations: Absent Uterine activity: Frequency: Every 4-6 minutes, Duration: 60 seconds and Intensity: moderate  Labs: Lab Results  Component Value Date   WBC 9.1 02/07/2019   HGB 11.4 (L) 02/07/2019   HCT 34.5 (L) 02/07/2019   MCV 97.7 02/07/2019   PLT 164 02/07/2019    Patient Active Problem List   Diagnosis Date Noted  . Non-reassuring electronic fetal monitoring tracing 02/07/2019  . Anemia in pregnancy 12/03/2018  . UTI in pregnancy 08/22/2018  . GBS (group B streptococcus) UTI complicating pregnancy 123XX123  . Anxiety and depression 08/15/2018  . Supervision of high risk pregnancy, antepartum 08/01/2018  . Asthma   . Influenza vaccination declined 03/06/2015  . Unspecified asthma(493.90) 10/15/2012  . Seasonal allergies 10/15/2012  . ADHD (attention deficit hyperactivity disorder) 10/15/2012    Assessment / Plan: Induction of labor due to non-reassuring fetal testing, no significant cervical change since admission  #Labor: FB placed @ 0927. Cytotec x5. #Fetal Wellbeing:  Category I #Pain Control: Epidural upon request and IV pain meds PRN #ID: GBS pos, PCN #Anticipated MOD: NSVD   Bud Face, DO, PGY-1 Family Medicine Resident, [facility] Faculty Teaching Service  02/08/2019, [phone]

## 2019-02-08 NOTE — Progress Notes (Signed)
LABOR PROGRESS NOTE  Tina Gibbs is a 19 y.o. G1P0 at [redacted]w[redacted]d admitted for IOL for NRFHT  Subjective: Patient feeling comfortable. Has been tolerating Cytotec without difficulty. No concerns at this time.  Objective: BP 119/78   Pulse 63   Temp 98.1 F (36.7 C) (Oral)   Resp 16   Ht 5\' 1"  (1.549 m)   Wt 67.9 kg   LMP 05/10/2018   BMI 28.28 kg/m   Dilation: Fingertip Effacement (%): 60 Station: -2 Presentation: Vertex Exam by:: Dr. Marice Potter Fetal monitoring: Baseline: 130 bpm, Variability: Good {> 6 bpm), Accelerations: Reactive and Decelerations: Absent Uterine activity: Frequency: Every 2-5 minutes, Duration: 60 seconds and Intensity: mild  Labs: Lab Results  Component Value Date   WBC 9.1 02/07/2019   HGB 11.4 (L) 02/07/2019   HCT 34.5 (L) 02/07/2019   MCV 97.7 02/07/2019   PLT 164 02/07/2019    Patient Active Problem List   Diagnosis Date Noted  . Non-reassuring electronic fetal monitoring tracing 02/07/2019  . Anemia in pregnancy 12/03/2018  . UTI in pregnancy 08/22/2018  . GBS (group B streptococcus) UTI complicating pregnancy 123XX123  . Anxiety and depression 08/15/2018  . Supervision of high risk pregnancy, antepartum 08/01/2018  . Asthma   . Influenza vaccination declined 03/06/2015  . Unspecified asthma(493.90) 10/15/2012  . Seasonal allergies 10/15/2012  . ADHD (attention deficit hyperactivity disorder) 10/15/2012    Assessment / Plan: Induction of labor due to non-reassuring fetal testing, no significant cervical change since admission  #Labor: FB placed @ 0927. Cytotec x3. #Fetal Wellbeing:  Category I #Pain Control: Epidural upon request and IV pain meds #ID: GBS pos, PCN #Anticipated MOD: NSVD   Bud Face, DO, PGY-1 Family Medicine Resident, [facility] Faculty Teaching Service  02/08/2019, 9:29 AM

## 2019-02-08 NOTE — Anesthesia Procedure Notes (Signed)
Epidural Patient location during procedure: OB Start time: 02/08/2019 11:34 PM End time: 02/08/2019 11:45 PM  Staffing Anesthesiologist: Pervis Hocking, DO Performed: anesthesiologist   Preanesthetic Checklist Completed: patient identified, IV checked, risks and benefits discussed, monitors and equipment checked, pre-op evaluation and timeout performed  Epidural Patient position: sitting Prep: DuraPrep and site prepped and draped Patient monitoring: continuous pulse ox, blood pressure, heart rate and cardiac monitor Approach: midline Location: L3-L4 Injection technique: LOR air  Needle:  Needle type: Tuohy  Needle gauge: 17 G Needle length: 9 cm Needle insertion depth: 5 cm Catheter type: closed end flexible Catheter size: 19 Gauge Catheter at skin depth: 10 cm Test dose: negative  Assessment Sensory level: T8 Events: blood not aspirated, injection not painful, no injection resistance, no paresthesia and negative IV test  Additional Notes Patient identified. Risks/Benefits/Options discussed with patient including but not limited to bleeding, infection, nerve damage, paralysis, failed block, incomplete pain control, headache, blood pressure changes, nausea, vomiting, reactions to medication both or allergic, itching and postpartum back pain. Confirmed with bedside nurse the patient's most recent platelet count. Confirmed with patient that they are not currently taking any anticoagulation, have any bleeding history or any family history of bleeding disorders. Patient expressed understanding and wished to proceed. All questions were answered. Sterile technique was used throughout the entire procedure. Please see nursing notes for vital signs. Test dose was given through epidural catheter and negative prior to continuing to dose epidural or start infusion. Warning signs of high block given to the patient including shortness of breath, tingling/numbness in hands, complete motor  block, or any concerning symptoms with instructions to call for help. Patient was given instructions on fall risk and not to get out of bed. All questions and concerns addressed with instructions to call with any issues or inadequate analgesia.  Reason for block:procedure for pain

## 2019-02-08 NOTE — Progress Notes (Signed)
LABOR PROGRESS NOTE  Tina Gibbs is a 19 y.o. G1P0 at [redacted]w[redacted]d admitted for IOL for NRFHT  Subjective: Becoming increasingly more uncomfortable. Just ate and Pit now started.   Objective: BP 125/73   Pulse 95   Temp 98.6 F (37 C) (Oral)   Resp 16   Ht 5\' 1"  (1.549 m)   Wt 67.9 kg   LMP 05/10/2018   BMI 28.28 kg/m   Dilation: 5 Effacement (%): 60 Station: -2 Presentation: Vertex Exam by:: Joylene Igo RN   Fetal monitoring: Baseline: 145 bpm, Variability: Good {> 6 bpm), Accelerations: Reactive and Decelerations: Absent Uterine activity: Frequency: Every 2-3 minutes  Labs: Lab Results  Component Value Date   WBC 9.1 02/07/2019   HGB 11.4 (L) 02/07/2019   HCT 34.5 (L) 02/07/2019   MCV 97.7 02/07/2019   PLT 164 02/07/2019    Patient Active Problem List   Diagnosis Date Noted  . Non-reassuring electronic fetal monitoring tracing 02/07/2019  . Anemia in pregnancy 12/03/2018  . UTI in pregnancy 08/22/2018  . GBS (group B streptococcus) UTI complicating pregnancy 123XX123  . Anxiety and depression 08/15/2018  . Supervision of high risk pregnancy, antepartum 08/01/2018  . Asthma   . Influenza vaccination declined 03/06/2015  . Unspecified asthma(493.90) 10/15/2012  . Seasonal allergies 10/15/2012  . ADHD (attention deficit hyperactivity disorder) 10/15/2012    Assessment / Plan: Induction of labor due to non-reassuring fetal testing.  #Labor: S/p FB and Cytotec x5. Pit started now. AROM as indicated. Anticipate SVD. #Fetal Wellbeing:  Category I #Pain Control: Epidural  #ID: GBS pos, PCN #Anticipated MOD: NSVD   Barrington Ellison, MD OB Family Medicine Fellow, [facility] for [employer], [place]

## 2019-02-08 NOTE — Progress Notes (Signed)
ZYRAH MORATH is a 19 y.o. G1P0 at [redacted]w[redacted]d admitted for IOL 2/2 NRFHT  Subjective: Pit at 8, breathing through contractions. Using the birthing ball for coping. FOB supportive at bedside.  Objective: BP 138/81   Pulse 79   Temp 98.3 F (36.8 C) (Oral)   Resp 16   Ht 5\' 1"  (1.549 m)   Wt 67.9 kg   LMP 05/10/2018   BMI 28.28 kg/m  No intake/output data recorded.  FHT:  FHR: 125 bpm, variability: moderate,  accelerations:  Present,  decelerations:  Absent UC:   regular, every 3-4 minutes  SVE:   Dilation: 5 Effacement (%): 50 Station: 0 Exam by:: Nakayla Rorabaugh  Pitocin @ 8 mu/min  Labs: Lab Results  Component Value Date   WBC 9.1 02/07/2019   HGB 11.4 (L) 02/07/2019   HCT 34.5 (L) 02/07/2019   MCV 97.7 02/07/2019   PLT 164 02/07/2019    Assessment / Plan: 19 yo G1P0 at 39.1 EGA here for IOL 2/2 non-reassuring fetal testing.  #Labor: S/p FB and Cytotec x5. Pit at 8. AROM with clear fluid at this check, baby feels ROA on exam.  #Fetal Wellbeing:  Category I #Pain Control: per patient request #ID: GBS pos, PCN #Anticipated MOD: vaginal delivery  Dan Europe Emine Lopata DO OB Fellow, Faculty Practice 02/08/2019, 10:16 PM

## 2019-02-08 NOTE — Anesthesia Preprocedure Evaluation (Addendum)
Anesthesia Evaluation  Patient identified by MRN, date of birth, ID band Patient awake    Reviewed: Allergy & Precautions, NPO status , Patient's Chart, lab work & pertinent test results  Airway Mallampati: II  TM Distance: >3 FB Neck ROM: Full    Dental no notable dental hx.    Pulmonary asthma ,    Pulmonary exam normal breath sounds clear to auscultation       Cardiovascular negative cardio ROS Normal cardiovascular exam Rhythm:Regular Rate:Normal     Neuro/Psych PSYCHIATRIC DISORDERS Anxiety Depression negative neurological ROS     GI/Hepatic negative GI ROS, Neg liver ROS,   Endo/Other  negative endocrine ROS  Renal/GU negative Renal ROS  negative genitourinary   Musculoskeletal negative musculoskeletal ROS (+)   Abdominal   Peds negative pediatric ROS (+)  Hematology  (+) Blood dyscrasia, anemia , hct 34.5, plt 164   Anesthesia Other Findings   Reproductive/Obstetrics (+) Pregnancy IOL for decreased fetal movement                            Anesthesia Physical Anesthesia Plan  ASA: II and emergent  Anesthesia Plan: Epidural   Post-op Pain Management:    Induction:   PONV Risk Score and Plan: 2  Airway Management Planned: Natural Airway  Additional Equipment: None  Intra-op Plan:   Post-operative Plan:   Informed Consent: I have reviewed the patients History and Physical, chart, labs and discussed the procedure including the risks, benefits and alternatives for the proposed anesthesia with the patient or authorized representative who has indicated his/her understanding and acceptance.       Plan Discussed with:   Anesthesia Plan Comments:         Anesthesia Quick Evaluation

## 2019-02-08 NOTE — Progress Notes (Addendum)
Labor Progress Note  Subjective: Pt doing well. Feeling some mild cramping, does not need anything for pain at this time.   Objective: BP 124/78   Pulse 78   Temp 97.6 F (36.4 C) (Axillary)   Resp 16   Ht 5\' 1"  (1.549 m)   Wt 67.9 kg   LMP 05/10/2018   BMI 28.28 kg/m   Fetal Well-Being: - FHR 140, moderate variability, +accels, no decels  Toco: - q2-3 minutes  Dilation: Fingertip Effacement (%): 60 Station: -2 Presentation: Vertex Exam by:: Ola Spurr, RN  Assessment and Plan: 19 y.o. G1P0 [redacted]w[redacted]d admitted for induction of labor for NRNST  Labor:  - Cervix FT/60 - 3rd dose of cytotec given - Plan for foley bulb insertion at next cytotec - GBS positive; PCN - Pain control: planning unmedicated birth   Maryagnes Amos, SNM 4:22 AM

## 2019-02-08 NOTE — Progress Notes (Signed)
Labor Progress Note  Subjective: Pt doing well. Feels some intermittent contractions, but tolerating well.   Objective: BP 129/84   Pulse 81   Temp 98 F (36.7 C) (Oral)   Resp 16   Ht 5\' 1"  (1.549 m)   Wt 67.9 kg   LMP 05/10/2018   BMI 28.28 kg/m   Fetal Well-Being: - FHR 140, moderate variability, +accels, no decels  Toco: - irregular  Dilation: Closed Effacement (%): 60, 70 Station: -3 Presentation: Vertex Exam by:: Maryagnes Amos, SCNM  Assessment and Plan: 19 y.o. G1P0 [redacted]w[redacted]d admitted for induction of labor for NRNST.  Labor:  - Cervix closed, but thinning - Plan for foley bulb at later time - 2nd dose of cytotec given - GBS positive; given PCN - Pain control: planning unmedicated   Maryagnes Amos, SNM 12:08 AM

## 2019-02-09 ENCOUNTER — Encounter (HOSPITAL_COMMUNITY): Payer: Self-pay | Admitting: Obstetrics & Gynecology

## 2019-02-09 DIAGNOSIS — O99824 Streptococcus B carrier state complicating childbirth: Secondary | ICD-10-CM

## 2019-02-09 MED ORDER — TETANUS-DIPHTH-ACELL PERTUSSIS 5-2.5-18.5 LF-MCG/0.5 IM SUSP: 0.5000 mL | Freq: Once | INTRAMUSCULAR | Status: DC

## 2019-02-09 MED ORDER — ACETAMINOPHEN 500 MG PO TABS: 1000.0000 mg | ORAL_TABLET | Freq: Four times a day (QID) | ORAL | Status: DC | PRN

## 2019-02-09 MED ORDER — MISOPROSTOL 200 MCG PO TABS
1000.0000 ug | ORAL_TABLET | Freq: Once | ORAL | Status: AC
  Administered 2019-02-09: 1000 ug via RECTAL

## 2019-02-09 MED ORDER — IBUPROFEN 600 MG PO TABS
600.0000 mg | ORAL_TABLET | Freq: Four times a day (QID) | ORAL | Status: DC
  Administered 2019-02-09 – 2019-02-10 (×6): 600 mg via ORAL
  Filled 2019-02-09 (×6): qty 1

## 2019-02-09 MED ORDER — MEDROXYPROGESTERONE ACETATE 150 MG/ML IM SUSP
150.0000 mg | Freq: Once | INTRAMUSCULAR | Status: AC
  Administered 2019-02-10: 12:00:00 150 mg via INTRAMUSCULAR
  Filled 2019-02-09: qty 1

## 2019-02-09 MED ORDER — ONDANSETRON HCL 4 MG PO TABS: 4.0000 mg | ORAL_TABLET | ORAL | Status: DC | PRN

## 2019-02-09 MED ORDER — SIMETHICONE 80 MG PO CHEW: 80.0000 mg | CHEWABLE_TABLET | ORAL | Status: DC | PRN

## 2019-02-09 MED ORDER — ONDANSETRON HCL 4 MG/2ML IJ SOLN: 4.0000 mg | INTRAMUSCULAR | Status: DC | PRN

## 2019-02-09 MED ORDER — COCONUT OIL OIL: 1.0000 "application " | TOPICAL_OIL | Status: DC | PRN

## 2019-02-09 MED ORDER — METHYLERGONOVINE MALEATE 0.2 MG/ML IJ SOLN
0.2000 mg | Freq: Once | INTRAMUSCULAR | Status: AC
  Administered 2019-02-09: 05:00:00 0.2 mg via INTRAMUSCULAR

## 2019-02-09 MED ORDER — ZOLPIDEM TARTRATE 5 MG PO TABS: 5.0000 mg | ORAL_TABLET | Freq: Every evening | ORAL | Status: DC | PRN

## 2019-02-09 MED ORDER — ACETAMINOPHEN 500 MG PO TABS
1000.0000 mg | ORAL_TABLET | Freq: Four times a day (QID) | ORAL | Status: DC | PRN
  Administered 2019-02-09: 06:00:00 1000 mg via ORAL
  Filled 2019-02-09: qty 2

## 2019-02-09 MED ORDER — MISOPROSTOL 200 MCG PO TABS
ORAL_TABLET | ORAL | Status: AC
  Filled 2019-02-09: qty 5

## 2019-02-09 MED ORDER — BENZOCAINE-MENTHOL 20-0.5 % EX AERO: 1.0000 "application " | INHALATION_SPRAY | CUTANEOUS | Status: DC | PRN

## 2019-02-09 MED ORDER — TRANEXAMIC ACID-NACL 1000-0.7 MG/100ML-% IV SOLN: 1000.0000 mg | INTRAVENOUS | Status: AC

## 2019-02-09 MED ORDER — METHYLERGONOVINE MALEATE 0.2 MG/ML IJ SOLN
INTRAMUSCULAR | Status: AC
  Filled 2019-02-09: qty 1

## 2019-02-09 MED ORDER — TRANEXAMIC ACID-NACL 1000-0.7 MG/100ML-% IV SOLN
INTRAVENOUS | Status: AC
  Administered 2019-02-09: 05:00:00 1000 mg
  Filled 2019-02-09: qty 100

## 2019-02-09 MED ORDER — ACETAMINOPHEN 325 MG PO TABS: 650.0000 mg | ORAL_TABLET | ORAL | Status: DC | PRN

## 2019-02-09 MED ORDER — WITCH HAZEL-GLYCERIN EX PADS: 1.0000 "application " | MEDICATED_PAD | CUTANEOUS | Status: DC | PRN

## 2019-02-09 MED ORDER — DIBUCAINE (PERIANAL) 1 % EX OINT: 1.0000 "application " | TOPICAL_OINTMENT | CUTANEOUS | Status: DC | PRN

## 2019-02-09 MED ORDER — DIPHENHYDRAMINE HCL 25 MG PO CAPS: 25.0000 mg | ORAL_CAPSULE | Freq: Four times a day (QID) | ORAL | Status: DC | PRN

## 2019-02-09 MED ORDER — PRENATAL MULTIVITAMIN CH
1.0000 | ORAL_TABLET | Freq: Every day | ORAL | Status: DC
  Administered 2019-02-09 – 2019-02-10 (×2): 1 via ORAL
  Filled 2019-02-09 (×2): qty 1

## 2019-02-09 MED ORDER — SENNOSIDES-DOCUSATE SODIUM 8.6-50 MG PO TABS
2.0000 | ORAL_TABLET | ORAL | Status: DC
  Administered 2019-02-09: 2 via ORAL
  Filled 2019-02-09: qty 2

## 2019-02-09 NOTE — Progress Notes (Signed)
Tina Gibbs is a 19 y.o. G1P0 at [redacted]w[redacted]d admitted for IOL 2/2 NRFHT  Subjective: Pt sleeping comfortably, epidural in place  Objective: BP 113/72   Pulse 76   Temp 98.7 F (37.1 C) (Oral)   Resp 16   Ht 5\' 1"  (1.549 m)   Wt 67.9 kg   LMP 05/10/2018   BMI 28.28 kg/m  No intake/output data recorded.  FHT:  FHR: 125 bpm, variability: moderate,  accelerations:  Present,  decelerations:  absent UC:   regular, every 2-3 minutes  SVE:   Dilation: 10 Effacement (%): 80 Station: Plus 2 Exam by:: Dr Chauncey Reading  Pitocin @ 16 mu/min  Labs: Lab Results  Component Value Date   WBC 9.1 02/07/2019   HGB 11.4 (L) 02/07/2019   HCT 34.5 (L) 02/07/2019   MCV 97.7 02/07/2019   PLT 164 02/07/2019    Assessment / Plan: 19 yo G1P0 at 39.1 EGA here for IOL 2/2 non-reassuring fetal testing.  #Labor: S/p FB and Cytotec x5. Pit at 32. AROM with clear fluid at midnight. Complete now, will start pushing momentarily. #Fetal Wellbeing:  Category I #Pain Control: per patient request #ID: GBS pos, PCN #Anticipated MOD: vaginal delivery  [name], MD [place], PGY-1 02/09/2019, 4:16 AM

## 2019-02-09 NOTE — Discharge Summary (Addendum)
Postpartum Discharge Summary    Patient Name: Tina Gibbs DOB: 2000/07/17 MRN: [id number]  Date of admission: 02/07/2019 Delivering Provider: [name]   Date of discharge: 02/10/2019  Admitting diagnosis: Labor and delivery, indication for care [O75.9] Intrauterine pregnancy: [redacted]w[redacted]d    Secondary diagnosis:  Active Problems:   Anxiety and depression   GBS (group B streptococcus) UTI complicating pregnancy   Non-reassuring electronic fetal monitoring tracing  Additional problems: None     Discharge diagnosis: Term Pregnancy Delivered                                                                                                Post partum procedures: none  Augmentation: AROM, Pitocin, Cytotec and Foley Balloon  Complications: None  Hospital course:  Induction of Labor With Vaginal Delivery   19y.o. yo G1P0 at 332w2das admitted to the hospital 02/07/2019 for induction of labor.  Indication for induction:  NRFHT .  Patient had an uncomplicated labor course as follows:  She was admitted for IOL 2/2 non-reassuring fetal tracing and started on cytotec. A FB was placed, and she was started on pitocin when it fell out. She was AROMed and progressed on pitocin to completely dilated, delivering shortly thereafter.  Membrane Rupture Time/Date: 10:11 PM ,02/08/2019   Intrapartum Procedures: Episiotomy: None [1]                                         Lacerations:  None [1]  Patient had delivery of a Viable infant.  Information for the patient's newborn:  ShHodan, Wurtz0[[id number]]Delivery Method: Vaginal, Spontaneous(Filed from Delivery Summary)    02/09/2019  Details of delivery can be found in separate delivery note.  After delivery fundus and lower uterine segment were boggy, despite bimanual massage and Pitocin. Methergine and 1000 PR cytotec were given, as well as 1 g TXA. Uterine tone subsequently improved.   Patient had a routine postpartum course, with the  exception of two mildly elevated blood pressures just after delivery: 120/92, 132/104, BP subsequently 110s/60-80s, and was asymptomatic. Message has been sent to clinic to schedule a pp blood pressure check. Patient is discharged home 02/10/19. Delivery time: 4:34 AM    Magnesium Sulfate received: No BMZ received: No Rhophylac:N/A MMR:N/A Transfusion:No  Physical exam  Vitals:   02/09/19 1122 02/09/19 1526 02/09/19 1945 02/10/19 0549  BP: (!) 112/55 116/61 104/65 113/73  Pulse: 71 61 66 (!) 57  Resp: _0 Temp: 97.8 F (36.6 C) 98.1 F (36.7 C) 98.1 F (36.7 C) 98.6 F (37 C)  TempSrc: Oral Oral Oral Oral  SpO2: 99% 99% 100% 100%  Weight:      Height:       General: alert, cooperative and no distress Lochia: appropriate Uterine Fundus: firm Incision: N/A DVT Evaluation: No evidence of DVT seen on physical exam. No cords or calf tenderness. No significant calf/ankle edema. Labs: Lab Results  Component Value Date   WBC  9.1 02/07/2019   HGB 11.4 (L) 02/07/2019   HCT 34.5 (L) 02/07/2019   MCV 97.7 02/07/2019   PLT 164 02/07/2019   CMP Latest Ref Rng & Units 02/07/2019  Glucose 70 - 99 mg/dL 100(H)  BUN 6 - 20 mg/dL 7  Creatinine 0.44 - 1.00 mg/dL 0.62  Sodium 135 - 145 mmol/L 136  Potassium 3.5 - 5.1 mmol/L 4.1  Chloride 98 - 111 mmol/L 107  CO2 22 - 32 mmol/L 22  Calcium 8.9 - 10.3 mg/dL 8.6(L)  Total Protein 6.5 - 8.1 g/dL 5.8(L)  Total Bilirubin 0.3 - 1.2 mg/dL 0.3  Alkaline Phos 38 - 126 U/L 126  AST 15 - 41 U/L 17  ALT 0 - 44 U/L 13    Discharge instruction: per After Visit Summary and "Baby and Me Booklet".  After visit meds:  Allergies as of 02/10/2019       Reactions   Lactose Intolerance (gi) Other (See Comments)   Patient does not have a known reaction   Peanut-containing Drug Products Itching        Medication List     STOP taking these medications    cephALEXin 500 MG capsule Commonly known as: KEFLEX       TAKE these  medications    acetaminophen 500 MG tablet Commonly known as: TYLENOL Take 2 tablets (1,000 mg total) by mouth every 6 (six) hours as needed for mild pain or fever (pain scale <4). What changed:  medication strength how much to take reasons to take this   Blood Pressure Monitoring Devi 1 Device by Does not apply route once a week. ICD 10: Z34 O09.90   famotidine 20 MG tablet Commonly known as: Pepcid Take 1 tablet (20 mg total) by mouth 2 (two) times daily.   ibuprofen 600 MG tablet Commonly known as: ADVIL Take 1 tablet (600 mg total) by mouth every 6 (six) hours.   PrePLUS 27-1 MG Tabs Take 1 tablet by mouth daily.        Diet: routine diet  Activity: Advance as tolerated. Pelvic rest for 6 weeks.   Outpatient follow up:4 weeks Follow up Appt: Future Appointments  Date Time Provider [place]  02/14/2019  8:15 AM [facility] [place] [facility]  02/14/2019  9:15 AM Rasch, Artist Pais, NP [facility]   Follow up Visit:  Please schedule this patient for Postpartum visit in: 4 weeks with the following provider: Any provider- virtual ok For C/S patients schedule nurse incision check in weeks 2 weeks: no Low risk pregnancy complicated by: NRFHT Delivery mode:  SVD Anticipated Birth Control:  Depo, ordered for prior to discharge PP Procedures needed: blood pressure check  Schedule Integrated [place] visit: no  Newborn Data: Live born female  Birth Weight:  3019g APGAR: 70, 9  Newborn Delivery   Birth date/time: 02/09/2019 04:34:00 Delivery type: Vaginal, Spontaneous      Baby Feeding: Breast Disposition:home with mother   02/10/2019 [name], MD   OB FELLOW ATTESTATION  I have seen and examined this patient and edited the above note to reflect any changes or updates.  Augustin Coupe, MD/[facility] OB Fellow  02/10/2019, 7:57 AM

## 2019-02-09 NOTE — Discharge Instructions (Signed)

## 2019-02-09 NOTE — Anesthesia Postprocedure Evaluation (Signed)
Anesthesia Post Note  Patient: Tina Gibbs  Procedure(s) Performed: AN AD [place]     Patient location during evaluation: Mother Baby Anesthesia Type: Epidural Level of consciousness: awake Pain management: satisfactory to patient Vital Signs Assessment: post-procedure vital signs reviewed and stable Respiratory status: spontaneous breathing Cardiovascular status: stable Anesthetic complications: no    Last Vitals:  Vitals:   02/09/19 0737 02/09/19 1122  BP: 128/68 (!) 112/55  Pulse: 67 71  Resp: 18 16  Temp: 37.3 C 36.6 C  SpO2: 99% 99%    Last Pain:  Vitals:   02/09/19 1122  TempSrc: Oral  PainSc: 0-No pain   Pain Goal:                   [employer]

## 2019-02-09 NOTE — Lactation Note (Signed)
This note was copied from a baby's chart. Lactation Consultation Note  Patient Name: Tina Gibbs [id number] Date: 02/09/2019   Mother, baby and father sleeping. LC will return later.     Maternal Data    Feeding Feeding Type: Breast Fed  LATCH Score Latch: Too sleepy or reluctant, no latch achieved, no sucking elicited.  Audible Swallowing: None  Type of Nipple: Everted at rest and after stimulation  Comfort (Breast/Nipple): Soft / non-tender  Hold (Positioning): Assistance needed to correctly position infant at breast and maintain latch.  LATCH Score: 5  Interventions    Lactation Tools Discussed/Used     Consult Status      Vivianne Master [facility] 02/09/2019, 11:01 AM

## 2019-02-09 NOTE — Progress Notes (Signed)
Tina Gibbs is a 19 y.o. G1P0 at [redacted]w[redacted]d admitted for IOL 2/2 NRFHT  Subjective: Pt comfortable, epidural in place  Objective: BP (!) 109/54   Pulse 67   Temp 98.7 F (37.1 C) (Oral)   Resp 16   Ht 5\' 1"  (1.549 m)   Wt 67.9 kg   LMP 05/10/2018   BMI 28.28 kg/m  No intake/output data recorded.  FHT:  FHR: 130 bpm, variability: moderate,  accelerations:  Present,  decelerations:  absent UC:   regular, every 3-4 minutes  SVE:   Dilation: 6.5 Effacement (%): 80 Station: 0 Exam by:: Ola Spurr, RN  Pitocin @ 14 mu/min  Labs: Lab Results  Component Value Date   WBC 9.1 02/07/2019   HGB 11.4 (L) 02/07/2019   HCT 34.5 (L) 02/07/2019   MCV 97.7 02/07/2019   PLT 164 02/07/2019    Assessment / Plan: 19 yo G1P0 at 39.1 EGA here for IOL 2/2 non-reassuring fetal testing.  #Labor: S/p FB and Cytotec x5. Pit at 75. AROM with clear fluid at midnight. Next check at 0600. #Fetal Wellbeing:  Category I #Pain Control: per patient request #ID: GBS pos, PCN #Anticipated MOD: vaginal delivery  [name], MD [place], PGY-1 02/09/2019, 2:33 AM

## 2019-02-09 NOTE — Lactation Note (Addendum)
This note was copied from a baby's chart. Lactation Consultation Note  Patient Name: Boy Chua Savoy [id number] Date: 02/09/2019 Reason for consult: Initial assessment   P1, 41 7 hours old.  Baby has been spitty and sleepy. Reviewed hand expression with drops expressed.  Gave drops to baby on spoon. Attempted latching in football and cross cradle and baby only sucked a few times. Reviewed basics.  Feed on demand with cues.  Goal 8-12+ times per day after first 24 hrs.  Place baby STS if not cueing. Mother will eat lunch and then place baby STS. Encouraged her to continue to spoon feed baby drops to interest him in feeding.  Mom made aware of O/P services, breastfeeding support groups, community resources, and our phone # for post-discharge questions.  Suggest calling to latch assistance as needed.     Maternal Data Has patient been taught Hand Expression?: Yes Does the patient have breastfeeding experience prior to this delivery?: No  Feeding Feeding Type: Breast Fed  LATCH Score                   Interventions Interventions: Breast feeding basics reviewed;Assisted with latch;Skin to skin;Hand express;Support pillows;Position options  Lactation Tools Discussed/Used     Consult Status Consult Status: Follow-up Date: 02/10/19 Follow-up type: In-patient    Vivianne Master [facility] 02/09/2019, 12:01 PM

## 2019-02-10 MED ORDER — IBUPROFEN 600 MG PO TABS: 600.0000 mg | ORAL_TABLET | Freq: Four times a day (QID) | ORAL | 0 refills | Status: DC

## 2019-02-10 MED ORDER — ACETAMINOPHEN 500 MG PO TABS: 1000.0000 mg | ORAL_TABLET | Freq: Four times a day (QID) | ORAL | 0 refills | Status: DC | PRN

## 2019-02-10 NOTE — Lactation Note (Signed)
This note was copied from a baby's chart. Lactation Consultation Note  Patient Name: Tina Gibbs [id number] Date: 02/10/2019 Reason for consult: Follow-up assessment;Mother's request;Difficult latch;1st time breastfeeding;Term P1, 20 hour term female infant. Per mom, infant has been spitty, not sustaining latch, mostly been suckling  at breast for only  2 to 3 minutes and  then stops.  Very short breastfeeding intervals. [place] notice mom is slightly short shafted and infant will not sustain latch. Mom fitted with 20 mm NS, mom latched infant on left breast using the football hold, infant sustained latch, colostrum present in NS, infant was still breastfeeding after 10 minutes when LC left the room. Mom understands to pre-pump with hand pump and how to apply 20 mm NS before latching infant at breast. Mom will attempt to latch infant without 20 NS one time during the day tomorrow, mom understands the NS is to be for short term usage Mom knows to call RN or LC if she needs further assistance with latching infant at breast. LC discussed techniques to keep infant awake at breast to feed and mom will work on longer feeding durations with infant. Mom knows to breastfeed infant according to hunger cues, 8 to 12 times within 24 hours, on demand and not exceed 3 hours without breastfeeding infant.    Maternal Data    Feeding Feeding Type: Breast Fed  LATCH Score Latch: Grasps breast easily, tongue down, lips flanged, rhythmical sucking.  Audible Swallowing: Spontaneous and intermittent  Type of Nipple: Everted at rest and after stimulation(slightly short shafted)  Comfort (Breast/Nipple): Soft / non-tender  Hold (Positioning): Assistance needed to correctly position infant at breast and maintain latch.  LATCH Score: 9  Interventions Interventions: Assisted with latch;Adjust position;Support pillows;Skin to skin;Breast massage;Position options;Pre-pump if needed;Breast  compression  Lactation Tools Discussed/Used Tools: Pump;Nipple Shields Nipple shield size: 20 Breast pump type: Manual Pump Review: Setup, frequency, and cleaning;Milk Storage Initiated by:: Vicente Serene, IBCLC Date initiated:: 02/10/19   Consult Status Consult Status: Follow-up Date: 02/10/19 Follow-up type: In-patient    Vicente Serene 02/10/2019, 1:12 AM

## 2019-02-10 NOTE — Progress Notes (Signed)
CSW received consult for hx of Anxiety and Depression.  CSW met with MOB to offer support and complete assessment.    When CSW arrived MOB was resting in bed and FOB was holding infant. With MOB's permission CSW asked FOB to leave in order to assess MOB in private; FOB left without incident. MOB was polite, easy to engage, and was receptive to meeting with CSW.   CSW asked about MOB's MH hx and MOB acknowledged a hx of anxiety and depression.  Per MOB she was dx while in middle and denied having any symptoms in over three years.  MOB presented with insight and awareness and did not demonstrate any acute symptoms. MOB reports having a good support team and reports having all essential items to care for infant.   CSW provided education regarding the baby blues period vs. perinatal mood disorders, discussed treatment and gave resources for mental health follow up if concerns arise.  CSW recommends self-evaluation during the postpartum time period using the New Mom Checklist from Postpartum Progress and encouraged MOB to contact a medical professional if symptoms are noted at any time. CSW assessed for safety and MOB denied SI, HI, and DV.   CSW provided review of Sudden Infant Death Syndrome (SIDS) precautions.    CSW identifies no further need for intervention and no barriers to discharge at this time.  Ronie Fleeger Boyd-Gilyard, MSW, LCSW Clinical Social Work [phone] 

## 2019-02-10 NOTE — Lactation Note (Signed)
This note was copied from a baby's chart. Lactation Consultation Note  Patient Name: Tina Gibbs [id number] Date: 02/10/2019 Reason for consult: Follow-up assessment;Difficult latch;Other (Comment)(NS use)   P1 mom ready for DC.  Feeding often without discomfort.  She is using a 20 NS.  [place] fitted a 24 NS and mom expressed more comfort when using during feed attempt.  LC was concerned bc 20NS just fit snuggly around base of nipple without any room to expand.    LC reviewed bf basics, cluster feeding during growth spurts, and engorgement prevention.  Mom has manual pump and LC encouraged her to pump before latching and try without NS.  Also worked with dad to help teacup breast tissue while infant goes to latch to breast.  Infant did latch and breastfeed with shield for 5 minutes then mom attempted without and infant latched but only fed for 1 minute before slipping off and falling asleep.  Mom has lactation phone number, OP services info, and support group info.         Maternal Data Has patient been taught Hand Expression?: Yes  Feeding Feeding Type: Breast Fed  LATCH Score Latch: Repeated attempts needed to sustain latch, nipple held in mouth throughout feeding, stimulation needed to elicit sucking reflex.  Audible Swallowing: A few with stimulation  Type of Nipple: Flat(semi flat)  Comfort (Breast/Nipple): Soft / non-tender  Hold (Positioning): Assistance needed to correctly position infant at breast and maintain latch.  LATCH Score: 6  Interventions Interventions: Breast feeding basics reviewed;Assisted with latch;Skin to skin;Breast massage;Pre-pump if needed;Position options;Support pillows;Adjust position;Breast compression;Hand pump  Lactation Tools Discussed/Used Tools: Nipple [place] [employer];Pump Nipple shield size: 24(changed from 20 to 24) Pump Review: Setup, frequency, and cleaning;Milk Storage   Consult Status Consult Status: Follow-up Date:  02/11/19 Follow-up type: In-patient    Ferne Coe [facility] 02/10/2019, 12:25 PM

## 2019-02-12 ENCOUNTER — Telehealth: Payer: Medicaid Other | Admitting: Student

## 2019-02-14 ENCOUNTER — Encounter: Payer: Medicaid Other | Admitting: Obstetrics and Gynecology

## 2019-02-14 ENCOUNTER — Other Ambulatory Visit: Payer: Medicaid Other

## 2019-02-18 ENCOUNTER — Other Ambulatory Visit: Payer: Self-pay

## 2019-02-18 ENCOUNTER — Ambulatory Visit (INDEPENDENT_AMBULATORY_CARE_PROVIDER_SITE_OTHER): Payer: Medicaid Other | Admitting: *Deleted

## 2019-02-18 DIAGNOSIS — Z0130 Encounter for examination of blood pressure without abnormal findings: Secondary | ICD-10-CM

## 2019-02-18 DIAGNOSIS — Z91199 Patient's noncompliance with other medical treatment and regimen due to unspecified reason: Secondary | ICD-10-CM

## 2019-02-18 DIAGNOSIS — Z5329 Procedure and treatment not carried out because of patient's decision for other reasons: Secondary | ICD-10-CM

## 2019-02-18 NOTE — Addendum Note (Signed)
Addended by: Virginia Rochester on: 02/18/2019 09:03 AM   Modules accepted: Level of Service

## 2019-02-18 NOTE — Progress Notes (Addendum)
Called pt for virtual visit as scheduled for BP check. She did not answer and I left VM stating the reason for my call. I will try her again in 5-10 minutes.   0850  Second attempt to reach pt and she did not answer. I left VM stating the importance of the visit and knowing her BP. We will reschedule the visit and she will be notified via [place]. If she has questions, comments or concerns, she can send a MyChart message.   Chart reviewed for nurse visit. Agree with plan of care.   Virginia Rochester, NP 02/18/2019 9:00 AM

## 2019-02-20 ENCOUNTER — Telehealth (INDEPENDENT_AMBULATORY_CARE_PROVIDER_SITE_OTHER): Payer: Medicaid Other

## 2019-02-20 ENCOUNTER — Other Ambulatory Visit: Payer: Self-pay

## 2019-02-20 VITALS — BP 113/76

## 2019-02-20 DIAGNOSIS — Z0130 Encounter for examination of blood pressure without abnormal findings: Secondary | ICD-10-CM

## 2019-02-20 NOTE — Progress Notes (Signed)
I connected with  Tina Gibbs on 02/20/19 at  2:00 PM EST by telephone and verified that I am speaking with the correct person using two identifiers.   I discussed the limitations, risks, security and privacy concerns of performing an evaluation and management service by telephone and the availability of in person appointments. I also discussed with the patient that there may be a patient responsible charge related to this service. The patient expressed understanding and agreed to proceed.  Pt reports that her BP is 113/76.  Pt denies any headache or visual disturbances. Pt advised to continue to monitor for sx's of elevated BP and to call the office if BP is 140/90.  I verified pt's pp appt on 03/25/19 virtually.  Pt verbalized understanding.       Verdell Carmine, RN 02/20/2019  2:01 PM

## 2019-02-20 NOTE — Progress Notes (Signed)
Patient seen and assessed by nursing staff during this encounter. I have reviewed the chart and agree with the documentation and plan.  Verita Schneiders, MD 02/20/2019 4:49 PM

## 2019-02-22 NOTE — Progress Notes (Unsigned)
Pt requested to have FOB a letter so that he could be taken out of work to help her with the baby since she has delivered vaginally.  Pt states that they are in the [employer] and they are needing a letter for him.  I advised pt

## 2019-03-25 ENCOUNTER — Telehealth: Payer: Medicaid Other | Admitting: Family Medicine

## 2019-03-25 NOTE — Progress Notes (Signed)
Patient unable to be seen for visit today, as her schedule was busy Merilyn Baba, DO OB Fellow, [facility] 03/25/2019 4:02 PM

## 2019-03-25 NOTE — Progress Notes (Signed)
Called mobile number at [phone]; VM left stating I was calling for patient's virtual appt and will call again in 15 minutes.   Called home number; spoke with patient who states she is unable to complete visit today. Explained to pt that the front office will call to reschedule.   Apolonio Schneiders RN 03/25/19

## 2019-03-26 ENCOUNTER — Telehealth: Payer: Self-pay | Admitting: Family Medicine

## 2019-03-26 ENCOUNTER — Encounter: Payer: Self-pay | Admitting: Family Medicine

## 2019-03-26 NOTE — Telephone Encounter (Signed)
Called the patient to reschedule the missed appointment. The patient answered and when asked to verify the date of birth the patient disconnected the call. Mailing the patient a missed appointment reminder letter.

## 2019-04-29 ENCOUNTER — Encounter: Payer: Self-pay | Admitting: Internal Medicine

## 2019-04-29 ENCOUNTER — Ambulatory Visit: Payer: Medicaid Other | Attending: Internal Medicine | Admitting: Internal Medicine

## 2019-04-29 ENCOUNTER — Other Ambulatory Visit: Payer: Self-pay

## 2019-04-29 DIAGNOSIS — J4520 Mild intermittent asthma, uncomplicated: Secondary | ICD-10-CM | POA: Diagnosis not present

## 2019-04-29 DIAGNOSIS — Z308 Encounter for other contraceptive management: Secondary | ICD-10-CM

## 2019-04-29 DIAGNOSIS — Z3009 Encounter for other general counseling and advice on contraception: Secondary | ICD-10-CM

## 2019-04-29 DIAGNOSIS — Z7689 Persons encountering health services in other specified circumstances: Secondary | ICD-10-CM | POA: Diagnosis not present

## 2019-04-29 NOTE — Progress Notes (Signed)
Virtual Visit via Telephone Note Due to current restrictions/limitations of in-office visits due to the COVID-19 pandemic, this scheduled clinical appointment was converted to a telehealth visit  I connected with Tina Gibbs on 04/29/19 at 4:05 p.m by telephone and verified that I am speaking with the correct person using two identifiers. I am in my office.  The patient is at home.  Only the patient and myself participated in this encounter.  I discussed the limitations, risks, security and privacy concerns of performing an evaluation and management service by telephone and the availability of in person appointments. I also discussed with the patient that there may be a patient responsible charge related to this service. The patient expressed understanding and agreed to proceed.   History of Present Illness: Patient is new to our practice.  She wishes to get established and would like to continue getting Depo Provera shot at our facility.  Previous PCP was at pediatrician.  She is not seen on establish care with a primary after she aged out from the pediatrician practice. She gives history of asthma but states that she has not had a flare in the past 4 years.  She thinks that she outgrew it.  Currently on Singulair.  She has not had to use albuterol inhaler in quite some time.  Main concern today is that she would like to continue getting her Depo-Provera shots.  She was on Depo for a few years during her late teenage years.  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  She had an unplanned pregnancy last year and gave birth in January of this year.  She received the Depo shot after giving birth on 02/10/2019.  She reports that she has had bleeding ever since giving birth up until about 2 weeks ago.  The bleeding stopped and then started having some bleeding again 2 days ago.  She thinks the bleeding is occurring because she is almost due for her next Depo shot.  Denies any history  with irregular bleeding when she was on Depo in the past.  She has not had any significant weight gain.  She tells me that the Depo shot was the only method of contraception that worked for her.  Birth control pills caused nausea and IUD caused headaches..   Outpatient Encounter Medications as of 04/29/2019  Medication Sig  . albuterol (VENTOLIN HFA) 108 (90 Base) MCG/ACT inhaler Inhale into the lungs every 6 (six) hours as needed for wheezing or shortness of breath.  . fluticasone (FLONASE) 50 MCG/ACT nasal spray Place into both nostrils daily.  Marland Kitchen ibuprofen (ADVIL) 600 MG tablet Take 1 tablet (600 mg total) by mouth every 6 (six) hours.  . montelukast (SINGULAIR) 10 MG tablet Take 10 mg by mouth at bedtime.  . Prenatal Vit-Fe Fumarate-FA (PREPLUS) 27-1 MG TABS Take 1 tablet by mouth daily.  Marland Kitchen acetaminophen (TYLENOL) 500 MG tablet Take 2 tablets (1,000 mg total) by mouth every 6 (six) hours as needed for mild pain or fever (pain scale <4). (Patient not taking: Reported on 02/20/2019)  . Blood Pressure Monitoring DEVI 1 Device by Does not apply route once a week. ICD 10: C9174311 O09.90 (Patient not taking: Reported on 02/20/2019)  . famotidine (PEPCID) 20 MG tablet Take 1 tablet (20 mg total) by mouth 2 (two) times daily. (Patient not taking: Reported on 02/20/2019)   No facility-administered encounter medications on file as of 04/29/2019.      Observations/Objective: No direct observation done  as this was a telephone visit.  Assessment and Plan: 1. Encounter to establish care   2. Family planning 3. Encounter for other contraceptive management Advised patient that Depo-Provera can cause unpredictable and sometimes heavy breakthrough bleeding.  She can consider Nexplanon.  However patient states that she wishes to continue with the Depo for now.  We have scheduled her a nurse only visit later this month to come and get her shot and then she will be on the schedule of every 3 months.  She will let me know  if the bleeding continues after she gets her next Depo shot.  4. Mild intermittent asthma without complication   Follow Up Instructions: As needed   I discussed the assessment and treatment plan with the patient. The patient was provided an opportunity to ask questions and all were answered. The patient agreed with the plan and demonstrated an understanding of the instructions.   The patient was advised to call back or seek an in-person evaluation if the symptoms worsen or if the condition fails to improve as anticipated.  I provided 15 minutes of non-face-to-face time during this encounter.   Karle Plumber, MD

## 2019-04-29 NOTE — Progress Notes (Signed)
Patient is new and would like to get est and get her Depo shots here at this clinic.

## 2019-05-10 ENCOUNTER — Ambulatory Visit: Payer: Medicaid Other | Attending: Internal Medicine | Admitting: *Deleted

## 2019-05-10 ENCOUNTER — Other Ambulatory Visit: Payer: Self-pay

## 2019-05-10 DIAGNOSIS — Z3042 Encounter for surveillance of injectable contraceptive: Secondary | ICD-10-CM | POA: Diagnosis not present

## 2019-05-10 DIAGNOSIS — Z308 Encounter for other contraceptive management: Secondary | ICD-10-CM

## 2019-05-10 LAB — POCT URINE PREGNANCY: Preg Test, Ur: NEGATIVE

## 2019-05-10 MED ORDER — MEDROXYPROGESTERONE ACETATE 150 MG/ML IM SUSP
150.0000 mg | Freq: Once | INTRAMUSCULAR | Status: AC
  Administered 2019-05-10: 150 mg via INTRAMUSCULAR

## 2019-05-10 NOTE — Progress Notes (Signed)
Date last pap: unk Last Depo-Provera: patient states last Depo- Provera injection was February 10, 2019 during hospitalization. No documentation located in chart showing she received Depo injection this day.  . Side Effects if any: patient states she has intermittent spotting and bleeding.  Urine HCG indicated? Yes. Resulted negative.  Depo-Provera 150 mg IM given by T.Marland Kitchen, RN in Right Next appointment due July 9- August 09, 2019

## 2019-05-28 ENCOUNTER — Other Ambulatory Visit: Payer: Self-pay

## 2019-05-28 ENCOUNTER — Encounter (HOSPITAL_COMMUNITY): Payer: Self-pay

## 2019-05-28 ENCOUNTER — Ambulatory Visit (HOSPITAL_COMMUNITY)
Admission: EM | Admit: 2019-05-28 | Discharge: 2019-05-28 | Disposition: A | Discharge status: Home / Self Care | Payer: Medicaid Other | Attending: Family Medicine | Admitting: Family Medicine

## 2019-05-28 DIAGNOSIS — R509 Fever, unspecified: Secondary | ICD-10-CM | POA: Insufficient documentation

## 2019-05-28 DIAGNOSIS — N760 Acute vaginitis: Secondary | ICD-10-CM | POA: Insufficient documentation

## 2019-05-28 DIAGNOSIS — B9689 Other specified bacterial agents as the cause of diseases classified elsewhere: Secondary | ICD-10-CM | POA: Insufficient documentation

## 2019-05-28 DIAGNOSIS — Z20822 Contact with and (suspected) exposure to covid-19: Secondary | ICD-10-CM | POA: Insufficient documentation

## 2019-05-28 LAB — SARS CORONAVIRUS 2 (TAT 6-24 HRS): SARS Coronavirus 2: NEGATIVE

## 2019-05-28 MED ORDER — FLUTICASONE PROPIONATE 50 MCG/ACT NA SUSP: 1.0000 | Freq: Every day | NASAL | 2 refills | Status: DC

## 2019-05-28 MED ORDER — CETIRIZINE HCL 10 MG PO TABS: 10.0000 mg | ORAL_TABLET | Freq: Every day | ORAL | 0 refills | Status: DC

## 2019-05-28 MED ORDER — METRONIDAZOLE 500 MG PO TABS: 500.0000 mg | ORAL_TABLET | Freq: Two times a day (BID) | ORAL | 0 refills | Status: DC

## 2019-05-28 MED ORDER — FLUCONAZOLE 150 MG PO TABS: 150.0000 mg | ORAL_TABLET | Freq: Every day | ORAL | 0 refills | Status: DC

## 2019-05-28 NOTE — ED Triage Notes (Signed)
Pt c/o nasal congestion, runny nose, loss of taste on Sunday, cough onset yesterday. Also reports Tmax 100.0 oral. Also reports SOBOE, facial pressure/tenderness, HA. Diarrhea yesterday.  Also c/o vaginal discharge with foul odor after using tampons for menstruation.  Denies abdom pain, n/v.   Requests refill of zyrtec, flonase.
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
## 2019-05-28 NOTE — Discharge Instructions (Addendum)
Refilled your allergy medication for your symptoms As we spoke about this could be allergy related or Covid.  We have sent a Covid swab and you can check your MyChart for results. Tylenol or ibuprofen as needed for pain, fevers Also treating you for bacterial vaginosis Please follow-up for any continued or worsening problems.  Sent a work note to Doctor, hospital

## 2019-06-03 ENCOUNTER — Encounter (HOSPITAL_COMMUNITY): Payer: Self-pay

## 2019-06-03 ENCOUNTER — Other Ambulatory Visit: Payer: Self-pay

## 2019-06-03 ENCOUNTER — Ambulatory Visit (HOSPITAL_COMMUNITY)
Admission: EM | Admit: 2019-06-03 | Discharge: 2019-06-03 | Disposition: A | Discharge status: Home / Self Care | Payer: Medicaid Other | Attending: Family Medicine | Admitting: Family Medicine

## 2019-06-03 DIAGNOSIS — L0291 Cutaneous abscess, unspecified: Secondary | ICD-10-CM

## 2019-06-03 MED ORDER — CEPHALEXIN 500 MG PO CAPS: 500.0000 mg | ORAL_CAPSULE | Freq: Four times a day (QID) | ORAL | 0 refills | Status: DC

## 2019-06-03 MED ORDER — LIDOCAINE HCL 2 % IJ SOLN
INTRAMUSCULAR | Status: AC
  Filled 2019-06-03: qty 20

## 2019-06-03 NOTE — Discharge Instructions (Addendum)
We opened up the abscess and drained.  Keep  doing warm compresses  Take the antibiotics as prescribed Ibuprofen as needed for pain.  Follow up as needed for continued or worsening symptoms

## 2019-06-03 NOTE — ED Triage Notes (Signed)
Pt presents with complaints of "boil" to the right upper side of her bottom. Reports it did come to head yesterday with no drainage. Pt denies relief with otc pain medication. The area is red, hard and tender to touch.

## 2019-06-04 DIAGNOSIS — L0291 Cutaneous abscess, unspecified: Secondary | ICD-10-CM | POA: Diagnosis not present
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
## 2019-07-26 ENCOUNTER — Ambulatory Visit: Payer: Medicaid Other

## 2019-07-29 ENCOUNTER — Other Ambulatory Visit: Payer: Self-pay

## 2019-07-29 ENCOUNTER — Ambulatory Visit: Payer: Medicaid Other | Attending: Internal Medicine

## 2019-07-29 VITALS — Temp 97.9°F

## 2019-07-29 DIAGNOSIS — Z308 Encounter for other contraceptive management: Secondary | ICD-10-CM

## 2019-07-29 MED ORDER — MEDROXYPROGESTERONE ACETATE 150 MG/ML IM SUSP
150.0000 mg | Freq: Once | INTRAMUSCULAR | Status: AC
  Administered 2019-07-29: 150 mg via INTRAMUSCULAR

## 2019-07-29 NOTE — Progress Notes (Signed)
Pt is here as instructed by PCP for Depo injections  Last Depo-Provera:05/10/2019 Side Effects if SQZ:YTMMI spotting lasting 1 day   Serum HCG indicated?YES/ Negative  Lot [id number] Exp 04/2020  Cautionedptthat if she had intercourse in the last two weeks she could be pregnant even though the test shows a negative result. Education given to pt in regard to use extra protection and possible false negative HGC if antibiotic intake or missed intervals. Verbalized understanding Depo-Provera 150 mg IM given in L vastus  lateralis muscle.Injection well tolerated. No reaction noted at the injection site. Explained pt the importance adherence to depo Provera perpetual calendar. Patient education was provided. Next appointmentscheduled for September 28

## 2019-10-15 ENCOUNTER — Other Ambulatory Visit: Payer: Self-pay

## 2019-10-15 ENCOUNTER — Ambulatory Visit: Payer: Medicaid Other | Attending: Internal Medicine

## 2019-10-15 VITALS — Temp 98.1°F

## 2019-10-15 DIAGNOSIS — Z308 Encounter for other contraceptive management: Secondary | ICD-10-CM

## 2019-10-15 DIAGNOSIS — N923 Ovulation bleeding: Secondary | ICD-10-CM | POA: Diagnosis not present

## 2019-10-15 DIAGNOSIS — M545 Low back pain, unspecified: Secondary | ICD-10-CM

## 2019-10-15 LAB — POCT URINE PREGNANCY: Preg Test, Ur: NEGATIVE

## 2019-10-15 MED ORDER — MEDROXYPROGESTERONE ACETATE 150 MG/ML IM SUSP
150.0000 mg | Freq: Once | INTRAMUSCULAR | Status: AC
  Administered 2019-10-15: 150 mg via INTRAMUSCULAR

## 2019-10-15 NOTE — Progress Notes (Signed)
Date last pap: Unknown Last Depo-Provera: 07/29/19 Side Effects if any: minor spotting Serum HCG indicated? Yes, negative Depo-Provera 150 mg IM given by: Eddie Dibbles, RN. Next appointment due 12/14-12/28, appt scheduled for 12/31/19. Administered in R ventrogluteal area. Pt tolerated well w/o adverse reaction noted.

## 2019-12-31 ENCOUNTER — Other Ambulatory Visit: Payer: Self-pay

## 2019-12-31 ENCOUNTER — Ambulatory Visit: Payer: Medicaid Other | Attending: Internal Medicine | Admitting: *Deleted

## 2019-12-31 DIAGNOSIS — Z308 Encounter for other contraceptive management: Secondary | ICD-10-CM

## 2019-12-31 MED ORDER — MEDROXYPROGESTERONE ACETATE 150 MG/ML IM SUSP
150.0000 mg | Freq: Once | INTRAMUSCULAR | Status: AC
  Administered 2019-12-31: 150 mg via INTRAMUSCULAR

## 2020-01-16 ENCOUNTER — Ambulatory Visit: Payer: Medicaid Other | Admitting: Physician Assistant

## 2020-01-20 ENCOUNTER — Ambulatory Visit (HOSPITAL_COMMUNITY)
Admission: EM | Admit: 2020-01-20 | Discharge: 2020-01-20 | Disposition: A | Discharge status: Home / Self Care | Payer: Medicaid Other | Attending: Internal Medicine | Admitting: Internal Medicine

## 2020-01-20 ENCOUNTER — Other Ambulatory Visit: Payer: Self-pay

## 2020-01-20 ENCOUNTER — Encounter (HOSPITAL_COMMUNITY): Payer: Self-pay | Admitting: Emergency Medicine

## 2020-01-20 DIAGNOSIS — U071 COVID-19: Secondary | ICD-10-CM | POA: Diagnosis not present

## 2020-01-20 DIAGNOSIS — Z8673 Personal history of transient ischemic attack (TIA), and cerebral infarction without residual deficits: Secondary | ICD-10-CM | POA: Diagnosis not present

## 2020-01-20 DIAGNOSIS — R519 Headache, unspecified: Secondary | ICD-10-CM | POA: Diagnosis present

## 2020-01-20 DIAGNOSIS — R6889 Other general symptoms and signs: Secondary | ICD-10-CM

## 2020-01-20 DIAGNOSIS — N760 Acute vaginitis: Secondary | ICD-10-CM | POA: Diagnosis not present

## 2020-01-20 DIAGNOSIS — Z79899 Other long term (current) drug therapy: Secondary | ICD-10-CM | POA: Diagnosis not present

## 2020-01-20 LAB — RESP PANEL BY RT-PCR (FLU A&B, COVID) ARPGX2
Influenza A by PCR: NEGATIVE
Influenza B by PCR: NEGATIVE
SARS Coronavirus 2 by RT PCR: POSITIVE — AB

## 2020-01-20 MED ORDER — IBUPROFEN 600 MG PO TABS: 600.0000 mg | ORAL_TABLET | Freq: Four times a day (QID) | ORAL | 0 refills | Status: DC | PRN

## 2020-01-20 NOTE — Discharge Instructions (Signed)
Increase oral fluid intake Tylenol/Motrin as needed We will call you with recommendations if your lab results are abnormal Please quarantine until COVID-19 test results are available.

## 2020-01-20 NOTE — ED Triage Notes (Addendum)
Pt presents with headache, sweats, chills, and cough on 12/28. States OTC medications give no relief.   Pt also complaining of vaginal discharge and odor xs 1 mo.

## 2020-01-21 ENCOUNTER — Telehealth: Payer: Self-pay

## 2020-01-21 LAB — CERVICOVAGINAL ANCILLARY ONLY
Bacterial Vaginitis (gardnerella): POSITIVE — AB
Candida Glabrata: NEGATIVE
Candida Vaginitis: NEGATIVE
Chlamydia: NEGATIVE
Comment: NEGATIVE
Comment: NEGATIVE
Comment: NEGATIVE
Comment: NEGATIVE
Comment: NEGATIVE
Comment: NORMAL
Neisseria Gonorrhea: NEGATIVE
Trichomonas: NEGATIVE

## 2020-01-21 MED ORDER — METRONIDAZOLE 500 MG PO TABS: 500.0000 mg | ORAL_TABLET | Freq: Two times a day (BID) | ORAL | 0 refills | Status: DC
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
## 2020-03-17 ENCOUNTER — Ambulatory Visit: Payer: Medicaid Other | Attending: Internal Medicine

## 2020-03-17 ENCOUNTER — Other Ambulatory Visit: Payer: Self-pay

## 2020-03-17 DIAGNOSIS — Z30013 Encounter for initial prescription of injectable contraceptive: Secondary | ICD-10-CM | POA: Diagnosis not present

## 2020-03-17 DIAGNOSIS — Z308 Encounter for other contraceptive management: Secondary | ICD-10-CM | POA: Diagnosis not present

## 2020-03-17 MED ORDER — MEDROXYPROGESTERONE ACETATE 150 MG/ML IM SUSP
150.0000 mg | Freq: Once | INTRAMUSCULAR | Status: AC
  Administered 2020-03-17: 150 mg via INTRAMUSCULAR

## 2020-03-17 NOTE — Progress Notes (Signed)
Pt arrived at clinic for depo shot. Pt was on time for shot no urine pregnacy needed. Pt was given shot in right thigh(IM). Pt was informed to return for appointment 06/02/20

## 2020-06-02 ENCOUNTER — Encounter: Payer: Self-pay | Admitting: Emergency Medicine

## 2020-06-02 ENCOUNTER — Ambulatory Visit: Payer: Medicaid Other | Attending: Internal Medicine | Admitting: Internal Medicine

## 2020-06-02 ENCOUNTER — Ambulatory Visit
Admission: EM | Admit: 2020-06-02 | Discharge: 2020-06-02 | Disposition: A | Discharge status: Home / Self Care | Payer: Medicaid Other | Attending: Student | Admitting: Student

## 2020-06-02 ENCOUNTER — Other Ambulatory Visit: Payer: Self-pay

## 2020-06-02 DIAGNOSIS — R112 Nausea with vomiting, unspecified: Secondary | ICD-10-CM

## 2020-06-02 DIAGNOSIS — J101 Influenza due to other identified influenza virus with other respiratory manifestations: Secondary | ICD-10-CM

## 2020-06-02 LAB — POC INFLUENZA A AND B ANTIGEN (URGENT CARE ONLY)
Influenza A Ag: POSITIVE — AB
Influenza B Ag: NEGATIVE

## 2020-06-02 MED ORDER — IBUPROFEN 800 MG PO TABS: 800.0000 mg | ORAL_TABLET | Freq: Three times a day (TID) | ORAL | 0 refills | Status: DC

## 2020-06-02 MED ORDER — ONDANSETRON 8 MG PO TBDP: 8.0000 mg | ORAL_TABLET | Freq: Three times a day (TID) | ORAL | 0 refills | Status: DC | PRN

## 2020-06-02 MED ORDER — ONDANSETRON 4 MG PO TBDP
4.0000 mg | ORAL_TABLET | Freq: Once | ORAL | Status: AC
  Administered 2020-06-02: 4 mg via ORAL
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
## 2020-06-02 NOTE — ED Triage Notes (Signed)
Patient presents to [facility] for evaluation of cough, nasal congestion, emesis (multiple in the past 24 hours) x 2 days

## 2020-06-02 NOTE — Discharge Instructions (Addendum)
-  Take the Zofran (ondansetron) up to 3 times daily for nausea and vomiting. Dissolve one pill under your tongue or between your teeth and your cheek. -For fever/chills, body aches, headaches, Take Tylenol 1000 mg 3 times daily, and ibuprofen 800 mg 3 times daily with food.  You can take these together, or alternate every 3-4 hours. -Drink plenty of fluids and eat a bland diet as tolerated. -Seek additional immediate medical attention if you develop new symptoms like the worst headache of your life, vision changes, weakness in your arms or legs, shortness of breath, wheezing, chest pain, inability to keep fluids down despite treatment.

## 2021-01-14 ENCOUNTER — Ambulatory Visit: Payer: Medicaid Other

## 2021-01-17 NOTE — L&D Delivery Note (Signed)
Delivery Note At 3:55 PM a viable female was delivered via Vaginal, Spontaneous (Presentation: Left Occiput Anterior).  Shoulder dystocia requiring McRoberts and suprapubic with release of shoulder and delivery without difficulty.  APGAR: 7, 9; weight  pending.   Placenta status: Spontaneous, Intact.  Cord: 3 vessels with the following complications: None.  Cord pH: n/a  Anesthesia: Epidural Episiotomy: None Lacerations: None Suture Repair:  n/a Est. Blood Loss (mL):  323  Mom to postpartum.  Baby to Couplet care / Skin to Skin.  Delice Lesch 12/07/2021, 4:10 PM

## 2021-01-27 ENCOUNTER — Telehealth: Payer: Self-pay | Admitting: *Deleted

## 2021-01-27 NOTE — Telephone Encounter (Signed)
Copied from [place] [phone]. Topic: General - Other >> Jan 26, 2021  9:29 AM Tessa Lerner A wrote: Reason for CRM: The patient has called to request orders for tb testing   The patient would like to have the test done at the office  The agent was unable to successfully schedule tb testing at the time of call with patient   Please contact further when available

## 2021-01-27 NOTE — Telephone Encounter (Signed)
Patient has appt scheduled. 02/02/2021

## 2021-02-02 ENCOUNTER — Ambulatory Visit: Payer: Medicaid Other

## 2021-03-26 ENCOUNTER — Ambulatory Visit: Payer: Self-pay

## 2021-04-07 ENCOUNTER — Ambulatory Visit (HOSPITAL_COMMUNITY)
Admission: RE | Admit: 2021-04-07 | Discharge: 2021-04-07 | Disposition: A | Discharge status: Home / Self Care | Payer: Medicaid Other | Source: Ambulatory Visit | Attending: Family Medicine | Admitting: Family Medicine

## 2021-04-07 ENCOUNTER — Other Ambulatory Visit: Payer: Self-pay

## 2021-04-07 ENCOUNTER — Encounter (HOSPITAL_COMMUNITY): Payer: Self-pay

## 2021-04-07 VITALS — BP 128/83 | HR 85 | Temp 98.4°F | Resp 16 | Ht 61.0 in | Wt 149.7 lb

## 2021-04-07 DIAGNOSIS — N898 Other specified noninflammatory disorders of vagina: Secondary | ICD-10-CM | POA: Diagnosis not present

## 2021-04-07 DIAGNOSIS — Z3201 Encounter for pregnancy test, result positive: Secondary | ICD-10-CM

## 2021-04-07 DIAGNOSIS — O210 Mild hyperemesis gravidarum: Secondary | ICD-10-CM | POA: Diagnosis not present

## 2021-04-07 DIAGNOSIS — O26891 Other specified pregnancy related conditions, first trimester: Secondary | ICD-10-CM | POA: Diagnosis present

## 2021-04-07 DIAGNOSIS — R42 Dizziness and giddiness: Secondary | ICD-10-CM | POA: Insufficient documentation

## 2021-04-07 DIAGNOSIS — Z3A08 8 weeks gestation of pregnancy: Secondary | ICD-10-CM | POA: Diagnosis not present

## 2021-04-07 LAB — POCT URINALYSIS DIPSTICK, ED / UC
Glucose, UA: NEGATIVE mg/dL
Ketones, ur: 80 mg/dL — AB
Leukocytes,Ua: NEGATIVE
Nitrite: NEGATIVE
Protein, ur: 30 mg/dL — AB
Specific Gravity, Urine: 1.025 (ref 1.005–1.030)
Urobilinogen, UA: 0.2 mg/dL (ref 0.0–1.0)
pH: 7 (ref 5.0–8.0)

## 2021-04-07 LAB — POC URINE PREG, ED: Preg Test, Ur: POSITIVE — AB

## 2021-04-07 MED ORDER — PRENATAL ADULT GUMMY/DHA/FA 0.4-25 MG PO CHEW: 1.0000 | CHEWABLE_TABLET | Freq: Every day | ORAL | 0 refills | Status: DC

## 2021-04-07 MED ORDER — DOXYLAMINE SUCCINATE (SLEEP) 25 MG PO TABS: 25.0000 mg | ORAL_TABLET | Freq: Two times a day (BID) | ORAL | 0 refills | Status: DC | PRN
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
## 2021-04-07 NOTE — ED Triage Notes (Signed)
Pt reports a possible pregnancy. Pt states LMP was 02/08/21, increased discharge, urinary frequency, abdominal cramping, vomiting the last three days.  ?

## 2021-04-07 NOTE — Discharge Instructions (Addendum)
You are approximately [redacted] weeks pregnant based on your last menstrual date.  Contact women's health care center or one of the OB/GYN's listed on your discharge paperwork to schedule an OB/GYN appointment. ?Your urine did not show any evidence of infection however I will place an order for urine culture.  This should result within 3 days.  If you need any additional treatment following the results of any of your lab work done today we will notify you by phone ?

## 2021-04-08 LAB — CERVICOVAGINAL ANCILLARY ONLY
Bacterial Vaginitis (gardnerella): POSITIVE — AB
Candida Glabrata: NEGATIVE
Candida Vaginitis: POSITIVE — AB
Chlamydia: NEGATIVE
Comment: NEGATIVE
Comment: NEGATIVE
Comment: NEGATIVE
Comment: NEGATIVE
Comment: NEGATIVE
Comment: NORMAL
Neisseria Gonorrhea: NEGATIVE
Trichomonas: NEGATIVE

## 2021-04-09 ENCOUNTER — Encounter: Payer: Self-pay | Admitting: Internal Medicine

## 2021-04-09 LAB — URINE CULTURE: Special Requests: NORMAL

## 2021-04-11 ENCOUNTER — Emergency Department (HOSPITAL_COMMUNITY): Admission: EM | Admit: 2021-04-11 | Discharge: 2021-04-11 | Payer: Medicaid Other | Source: Home / Self Care

## 2021-04-11 ENCOUNTER — Inpatient Hospital Stay (HOSPITAL_COMMUNITY)
Admission: AD | Admit: 2021-04-11 | Discharge: 2021-04-11 | Disposition: A | Discharge status: Home / Self Care | Payer: Medicaid Other | Attending: Obstetrics & Gynecology | Admitting: Obstetrics & Gynecology

## 2021-04-11 ENCOUNTER — Other Ambulatory Visit: Payer: Self-pay

## 2021-04-11 ENCOUNTER — Encounter (HOSPITAL_COMMUNITY): Payer: Self-pay | Admitting: Obstetrics & Gynecology

## 2021-04-11 ENCOUNTER — Inpatient Hospital Stay (HOSPITAL_COMMUNITY): Payer: Medicaid Other

## 2021-04-11 DIAGNOSIS — Z791 Long term (current) use of non-steroidal anti-inflammatories (NSAID): Secondary | ICD-10-CM | POA: Insufficient documentation

## 2021-04-11 DIAGNOSIS — Z3A08 8 weeks gestation of pregnancy: Secondary | ICD-10-CM | POA: Diagnosis not present

## 2021-04-11 DIAGNOSIS — Z3A01 Less than 8 weeks gestation of pregnancy: Secondary | ICD-10-CM

## 2021-04-11 DIAGNOSIS — O219 Vomiting of pregnancy, unspecified: Secondary | ICD-10-CM

## 2021-04-11 DIAGNOSIS — O210 Mild hyperemesis gravidarum: Secondary | ICD-10-CM | POA: Insufficient documentation

## 2021-04-11 DIAGNOSIS — Z3490 Encounter for supervision of normal pregnancy, unspecified, unspecified trimester: Secondary | ICD-10-CM

## 2021-04-11 LAB — WET PREP, GENITAL
Sperm: NONE SEEN
Trich, Wet Prep: NONE SEEN
WBC, Wet Prep HPF POC: >=10 — AB (ref <10)
Yeast Wet Prep HPF POC: NONE SEEN

## 2021-04-11 LAB — CBC WITH DIFFERENTIAL/PLATELET
Abs Immature Granulocytes: 0.05 10*3/uL (ref 0.00–0.07)
Basophils Absolute: 0 10*3/uL (ref 0.0–0.1)
Basophils Relative: 0 %
Eosinophils Absolute: 0 10*3/uL (ref 0.0–0.5)
Eosinophils Relative: 0 %
HCT: 37.3 % (ref 36.0–46.0)
Hemoglobin: 12.7 g/dL (ref 12.0–15.0)
Immature Granulocytes: 0 %
Lymphocytes Relative: 15 %
Lymphs Abs: 1.9 10*3/uL (ref 0.7–4.0)
MCH: 32.3 pg (ref 26.0–34.0)
MCHC: 34 g/dL (ref 30.0–36.0)
MCV: 94.9 fL (ref 80.0–100.0)
Monocytes Absolute: 0.8 10*3/uL (ref 0.1–1.0)
Monocytes Relative: 6 %
Neutro Abs: 9.8 10*3/uL — ABNORMAL HIGH (ref 1.7–7.7)
Neutrophils Relative %: 79 %
Platelets: 282 10*3/uL (ref 150–400)
RBC: 3.93 MIL/uL (ref 3.87–5.11)
RDW: 12.2 % (ref 11.5–15.5)
WBC: 12.5 10*3/uL — ABNORMAL HIGH (ref 4.0–10.5)
nRBC: 0 % (ref 0.0–0.2)

## 2021-04-11 LAB — URINALYSIS, ROUTINE W REFLEX MICROSCOPIC
Bacteria, UA: NONE SEEN
Bilirubin Urine: NEGATIVE
Glucose, UA: NEGATIVE mg/dL
Ketones, ur: 80 mg/dL — AB
Leukocytes,Ua: NEGATIVE
Nitrite: NEGATIVE
Protein, ur: 30 mg/dL — AB
RBC / HPF: >50 RBC/hpf — ABNORMAL HIGH (ref 0–5)
Specific Gravity, Urine: 1.031 — ABNORMAL HIGH (ref 1.005–1.030)
pH: 6 (ref 5.0–8.0)

## 2021-04-11 LAB — COMPREHENSIVE METABOLIC PANEL
ALT: 17 U/L (ref 0–44)
AST: 16 U/L (ref 15–41)
Albumin: 4.3 g/dL (ref 3.5–5.0)
Alkaline Phosphatase: 46 U/L (ref 38–126)
Anion gap: 10 (ref 5–15)
BUN: 11 mg/dL (ref 6–20)
CO2: 23 mmol/L (ref 22–32)
Calcium: 9.3 mg/dL (ref 8.9–10.3)
Chloride: 103 mmol/L (ref 98–111)
Creatinine, Ser: 0.64 mg/dL (ref 0.44–1.00)
GFR, Estimated: >60 mL/min (ref >60)
Glucose, Bld: 84 mg/dL (ref 70–99)
Potassium: 3.3 mmol/L — ABNORMAL LOW (ref 3.5–5.1)
Sodium: 136 mmol/L (ref 135–145)
Total Bilirubin: 0.9 mg/dL (ref 0.3–1.2)
Total Protein: 7.2 g/dL (ref 6.5–8.1)

## 2021-04-11 LAB — HCG, QUANTITATIVE, PREGNANCY: hCG, Beta Chain, Quant, S: 82862 m[IU]/mL — ABNORMAL HIGH (ref <5)

## 2021-04-11 IMAGING — US US OB < 14 WEEKS - US OB TV
1 series · 15 of 28 positions shown · non-contrast
Comparison: None.
COMPARISON: None.

Addendum:
CLINICAL DATA: Abdominal pain first trimester pregnancy



[Series 1: us ob < 14 weeks - us ob tv · 28 acquisitions, 15 frames shown]
[im 1/28]
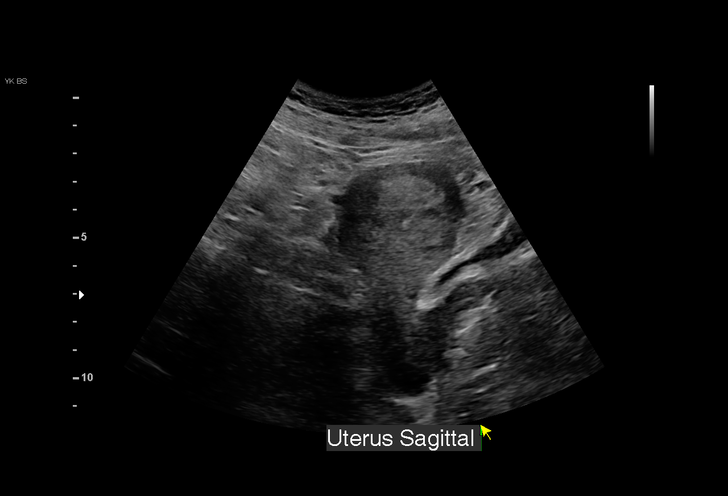
[im 3/28]
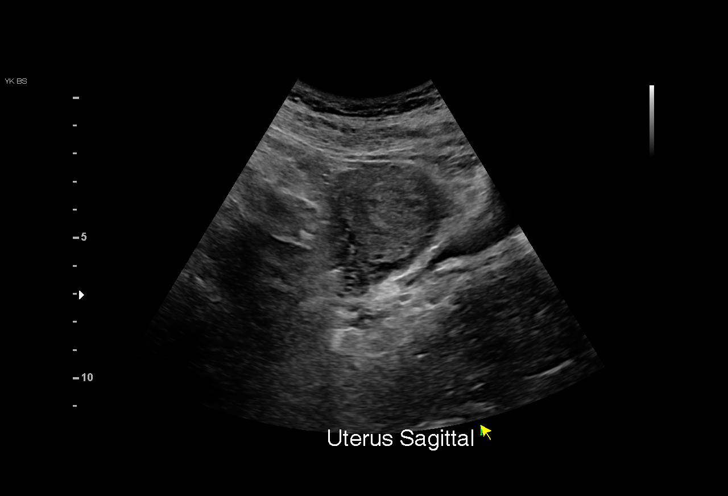
[im 5/28]
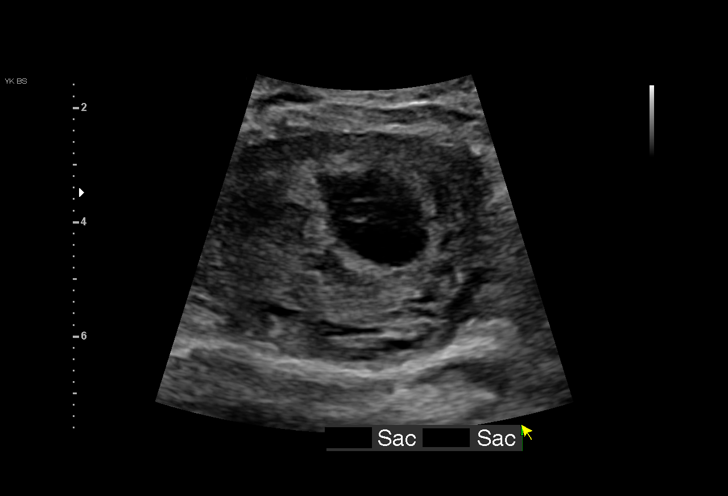
[im 7/28]
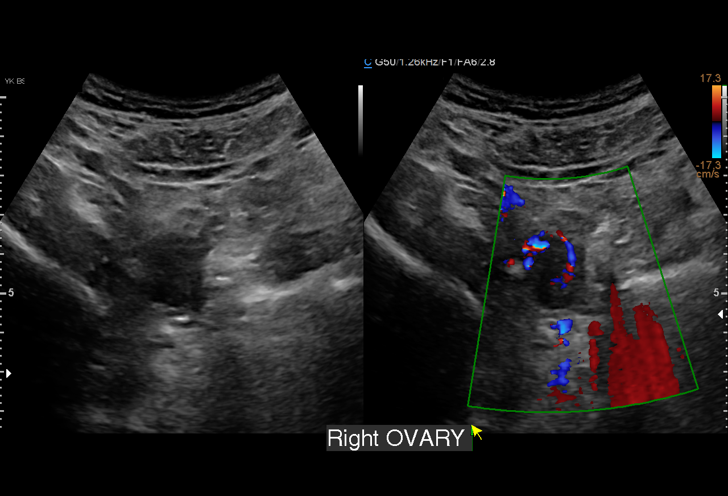
[im 9/28]
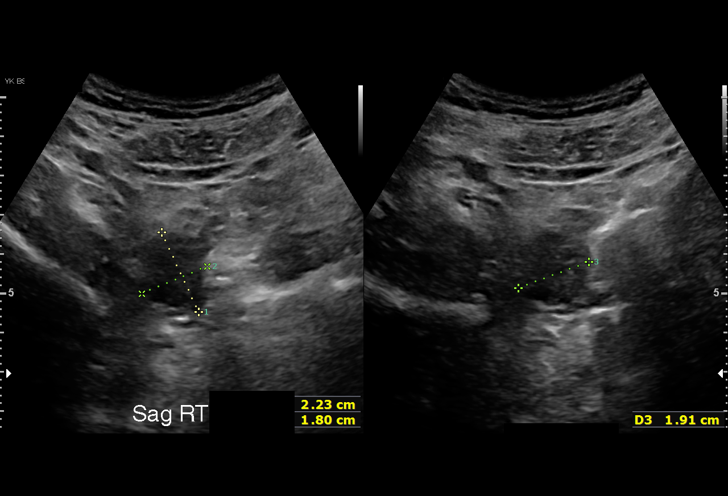
[im 11/28]
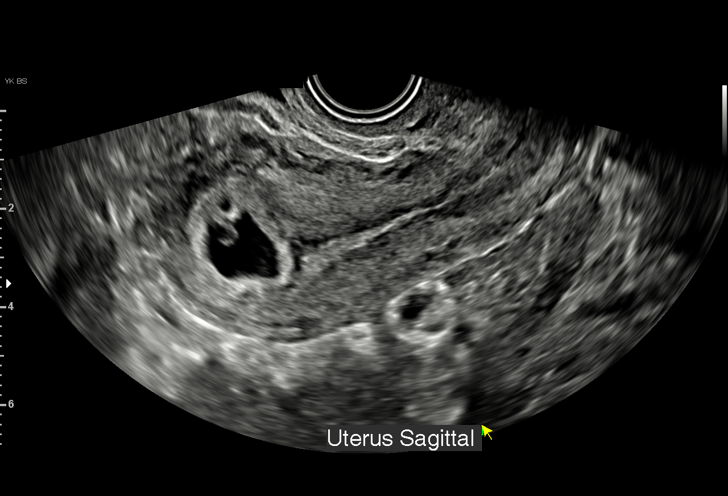
[im 13/28]
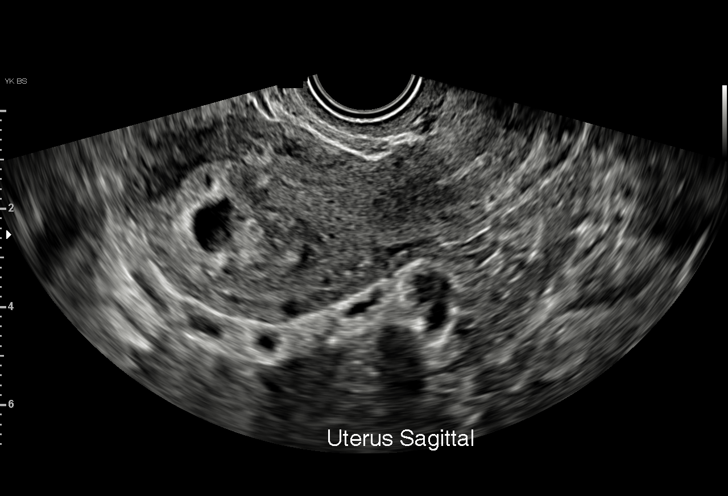
[im 15/28]
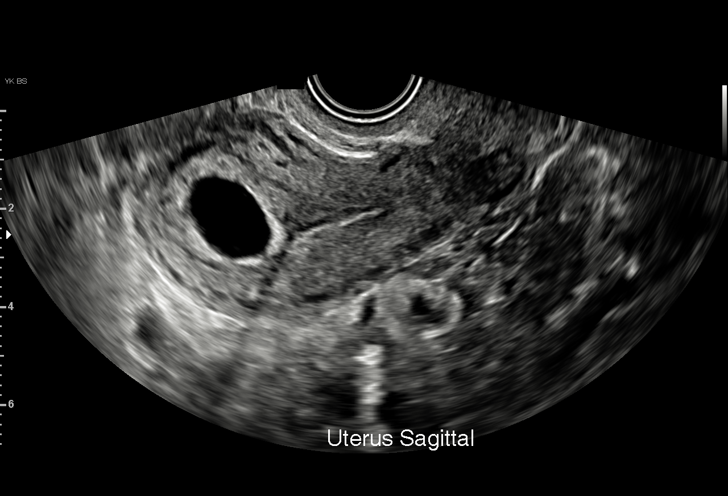
[im 16/28]
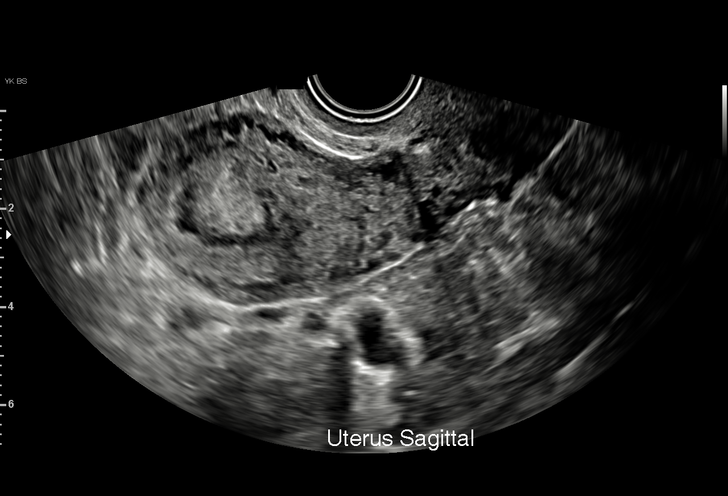
[im 18/28]
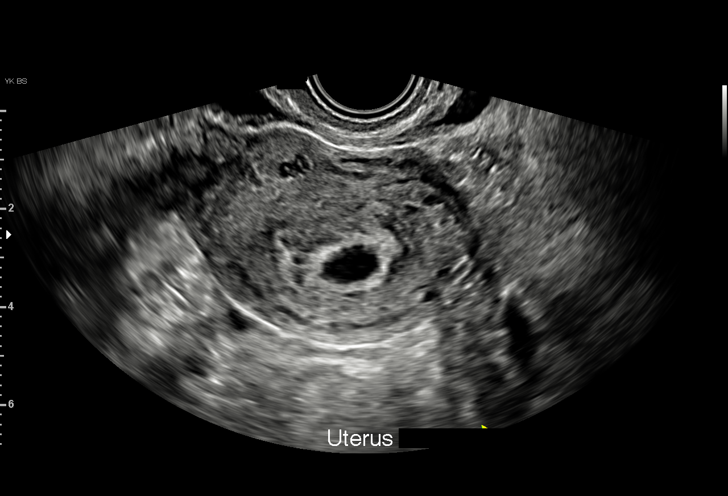
[im 20/28]
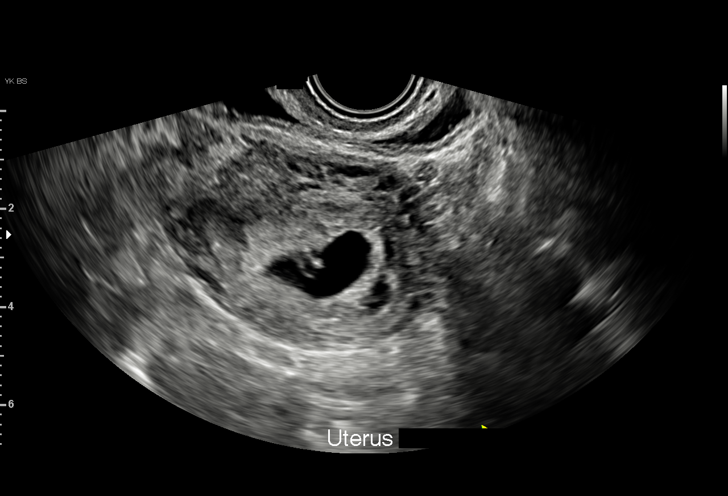
[im 22/28]
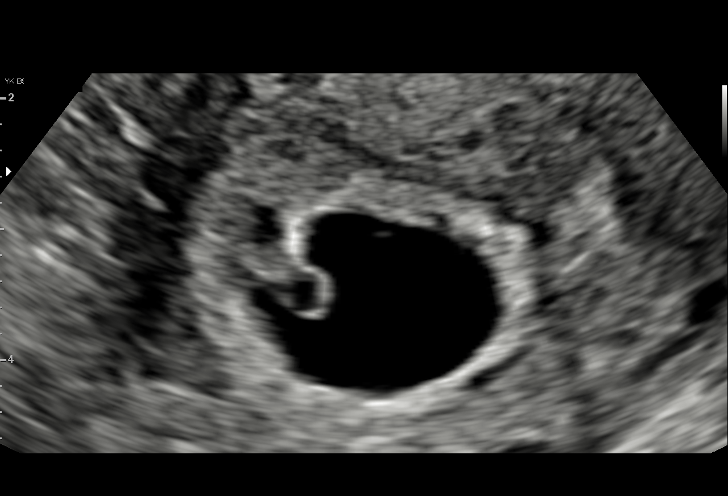
[im 24/28]
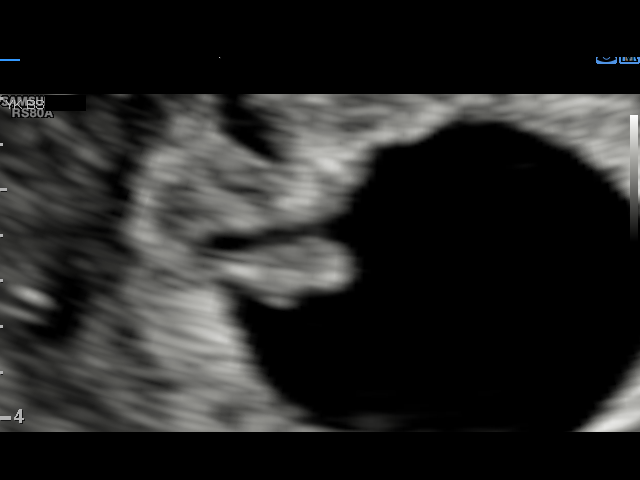
[im 26/28]
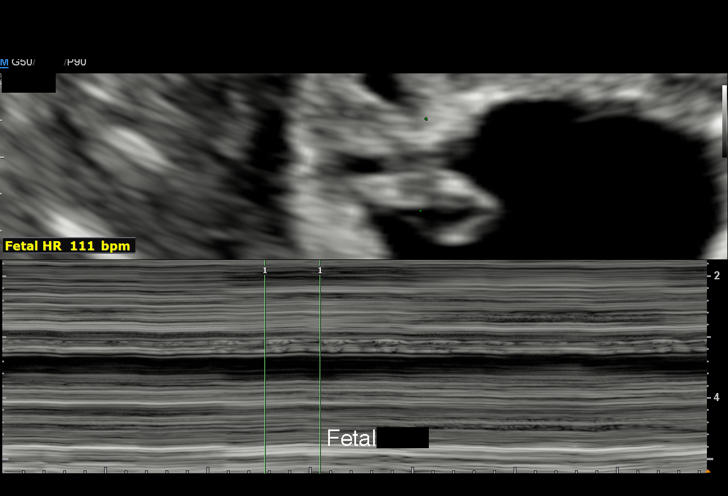
[im 28/28]
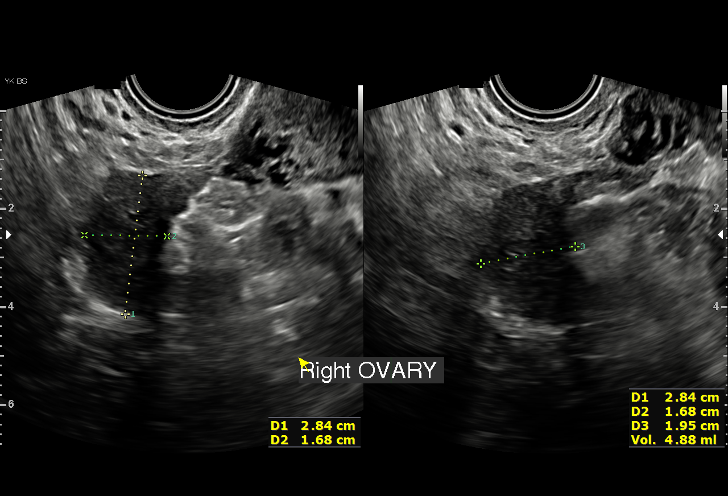

[15 of 28 positions shown; findings below may reference images not displayed]

FINDINGS: Intrauterine gestational sac: Single

Yolk sac:  Seen

Embryo:  Seen

Cardiac Activity: Seen

Heart Rate: 109 bpm, in the lower range of normal.

CRL:   5.2 mm   6 w 1 d                  US EDC: [DATE]

Subchorionic hemorrhage:  None visualized.

Maternal uterus/adnexae: Unremarkable.

Pulsed Doppler evaluation of both ovaries demonstrates normal
appearing low-resistance arterial and venous waveforms.
IMPRESSION: Single live intrauterine pregnancy seen. Sonographically estimated
gestational age is 6 weeks 1 day. Fetal heart rate is 109 beats per
minute.

ADDENDUM:
This addendum is made to clarify Doppler technique used for the
study. Color flow Doppler examination was done. Pulsed Doppler
examination was not performed.

*** End of Addendum ***
FINDINGS: Intrauterine gestational sac: Single

Yolk sac:  Seen

Embryo:  Seen

Cardiac Activity: Seen

Heart Rate: 109 bpm, in the lower range of normal.

CRL:   5.2 mm   6 w 1 d                  US EDC: [DATE]

Subchorionic hemorrhage:  None visualized.

Maternal uterus/adnexae: Unremarkable.

Pulsed Doppler evaluation of both ovaries demonstrates normal
appearing low-resistance arterial and venous waveforms.
IMPRESSION: Single live intrauterine pregnancy seen. Sonographically estimated
gestational age is 6 weeks 1 day. Fetal heart rate is 109 beats per
minute.

## 2021-04-11 MED ORDER — PRENATABS RX 29-1 MG PO TABS: 1.0000 | ORAL_TABLET | Freq: Every day | ORAL | 8 refills | Status: DC

## 2021-04-11 MED ORDER — LACTATED RINGERS IV BOLUS
1000.0000 mL | Freq: Once | INTRAVENOUS | Status: AC
  Administered 2021-04-11: 1000 mL via INTRAVENOUS

## 2021-04-11 MED ORDER — FAMOTIDINE 20 MG PO TABS: 20.0000 mg | ORAL_TABLET | Freq: Every day | ORAL | 1 refills | Status: DC

## 2021-04-11 MED ORDER — FAMOTIDINE IN NACL 20-0.9 MG/50ML-% IV SOLN
20.0000 mg | Freq: Once | INTRAVENOUS | Status: AC
  Administered 2021-04-11: 20 mg via INTRAVENOUS
  Filled 2021-04-11: qty 50

## 2021-04-11 MED ORDER — METOCLOPRAMIDE HCL 5 MG/ML IJ SOLN
10.0000 mg | Freq: Once | INTRAMUSCULAR | Status: AC
  Administered 2021-04-11: 10 mg via INTRAVENOUS
  Filled 2021-04-11: qty 2

## 2021-04-11 MED ORDER — METOCLOPRAMIDE HCL 10 MG PO TABS: 10.0000 mg | ORAL_TABLET | Freq: Three times a day (TID) | ORAL | 1 refills | Status: DC
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
## 2021-04-11 NOTE — Discharge Instructions (Signed)
Prenatal Care Providers ? ?         ?[facility] [place] [facility] - accepts patients without insurance ? Phone: [phone] ? ?[facility] [place] [facility] [place]  ? Phone: [phone] ? ?[facility]      ? Phone: [phone]   ?         ?[facility] [place] [facility] [place]    ? Phone: [phone] ?         ?[facility] [facility] [place]  ? Phone: [phone] ? ?[facility] [place] [facility] - accepts patients without insurance ? Phone: [phone] ? ?[facility] [place] [facility] ?Phone: [phone] ?    ?[facility] - accepts patients without insurance ?Phone: [phone] ? ?[facility]  ?Phone: [phone] ? ?[facility] ?Phone: [phone] ? ?[facility] ?Phone: [phone] ? ?[facility] ?Phone: [phone] ? ?[place] ?Phone: [phone] ? ?[facility]  ?Phone: [phone] ? ? ? ? ? ? ?                 Safe Medications in Pregnancy  ? ? ?Acne: ?Benzoyl Peroxide ?Salicylic Acid ? ?Backache/Headache: ?Tylenol: 2 regular strength every 4 hours OR ?             2 Extra strength every 6 hours ? ?Colds/Coughs/Allergies: ?Benadryl (alcohol free) 25 mg every 6 hours as needed ?Breath right strips ?Claritin ?Cepacol throat lozenges ?Chloraseptic throat spray ?Cold-Eeze- up to three times per day ?Cough drops, alcohol free ?Flonase (by prescription only) ?Guaifenesin ?Mucinex ?Robitussin DM (plain only, alcohol free) ?Saline nasal spray/drops ?Sudafed (pseudoephedrine) & Actifed ** use only after [redacted] weeks gestation and if you do not have high blood pressure ?Tylenol ?Vicks Vaporub ?Zinc lozenges ?Zyrtec  ? ?Constipation: ?Colace ?Ducolax suppositories ?Fleet enema ?Glycerin suppositories ?Metamucil ?Milk of magnesia ?Miralax ?Senokot ?Smooth move tea ? ?Diarrhea: ?Kaopectate ?Imodium A-D ? ?*NO pepto  Bismol ? ?Hemorrhoids: ?Anusol ?Anusol HC ?Preparation H ?Tucks ? ?Indigestion: ?Tums ?Maalox ?Mylanta ?Zantac  ?Pepcid ? ?Insomnia: ?Benadryl (alcohol free) '25mg'$  every 6 hours as needed ?Tylenol PM ?Unisom, no Gelcaps ? ?Leg Cramps: ?Tums ?MagGel ? ?Nausea/Vomiting:  ?Bonine ?Dramamine ?Emetrol ?Ginger extract ?Sea bands ?Meclizine  ?Nausea medication to take during pregnancy:  ?Unisom (doxylamine succinate 25 mg tablets) Take one tablet daily at bedtime. If symptoms are not adequately controlled, the dose can be increased to a maximum recommended dose of two tablets daily (1/2 tablet in the morning, 1/2 tablet mid-afternoon and one at bedtime). ?Vitamin B6 '100mg'$  tablets. Take one tablet twice a day (up to 200 mg per day). ? ?Skin Rashes: ?Aveeno products ?Benadryl cream or '25mg'$  every 6 hours as needed ?Calamine Lotion ?1% cortisone cream ? ?Yeast infection: ?Gyne-lotrimin 7 ?Monistat 7 ? ? ?**If taking multiple medications, please check labels to avoid duplicating the same active ingredients ?**take medication as directed on the label ?** Do not exceed 4000 mg of tylenol in 24 hours ?**Do not take medications that contain aspirin or ibuprofen ? ? ? ? ? ? ? ? ? ?

## 2021-04-11 NOTE — MAU Note (Signed)
Tina Gibbs is a 21 y.o. at Unknown here in MAU reporting: N/V and headache.  Reports has been vomiting for 3 days and unable to keep anything down including water for past 2 days.  Denies taking meds to treat H/A.  ?LMP: 02/08/2021 ?Onset of complaint: 3 days ago ?Pain score: 7/10 H/A ?Vitals:  ? 04/11/21 1426  ?BP: 124/66  ?Pulse: 71  ?Resp: 18  ?Temp: 98.3 ?F (36.8 ?C)  ?SpO2: 100%  ?   ?FHT:N/A ?Lab orders placed from triage:   UA ?

## 2021-04-12 LAB — CULTURE, OB URINE: Culture: <10000 — AB

## 2021-04-12 LAB — GC/CHLAMYDIA PROBE AMP (~~LOC~~) NOT AT ARMC
Chlamydia: NEGATIVE
Comment: NEGATIVE
Comment: NORMAL
Neisseria Gonorrhea: NEGATIVE

## 2021-05-21 ENCOUNTER — Encounter (HOSPITAL_COMMUNITY): Payer: Self-pay | Admitting: Obstetrics & Gynecology

## 2021-05-21 ENCOUNTER — Inpatient Hospital Stay (HOSPITAL_COMMUNITY): Payer: Medicaid Other

## 2021-05-21 ENCOUNTER — Inpatient Hospital Stay (HOSPITAL_COMMUNITY)
Admission: AD | Admit: 2021-05-21 | Discharge: 2021-05-21 | Disposition: A | Discharge status: Home / Self Care | Payer: Medicaid Other | Attending: Obstetrics & Gynecology | Admitting: Obstetrics & Gynecology

## 2021-05-21 DIAGNOSIS — Z3A11 11 weeks gestation of pregnancy: Secondary | ICD-10-CM | POA: Diagnosis not present

## 2021-05-21 DIAGNOSIS — O219 Vomiting of pregnancy, unspecified: Secondary | ICD-10-CM | POA: Diagnosis not present

## 2021-05-21 DIAGNOSIS — R1010 Upper abdominal pain, unspecified: Secondary | ICD-10-CM

## 2021-05-21 DIAGNOSIS — R112 Nausea with vomiting, unspecified: Secondary | ICD-10-CM

## 2021-05-21 DIAGNOSIS — R1011 Right upper quadrant pain: Secondary | ICD-10-CM | POA: Insufficient documentation

## 2021-05-21 DIAGNOSIS — O26891 Other specified pregnancy related conditions, first trimester: Secondary | ICD-10-CM | POA: Diagnosis not present

## 2021-05-21 LAB — COMPREHENSIVE METABOLIC PANEL
ALT: 13 U/L (ref 0–44)
AST: 16 U/L (ref 15–41)
Albumin: 3.4 g/dL — ABNORMAL LOW (ref 3.5–5.0)
Alkaline Phosphatase: 39 U/L (ref 38–126)
Anion gap: 7 (ref 5–15)
BUN: 10 mg/dL (ref 6–20)
CO2: 24 mmol/L (ref 22–32)
Calcium: 9 mg/dL (ref 8.9–10.3)
Chloride: 107 mmol/L (ref 98–111)
Creatinine, Ser: 0.51 mg/dL (ref 0.44–1.00)
GFR, Estimated: >60 mL/min (ref >60)
Glucose, Bld: 100 mg/dL — ABNORMAL HIGH (ref 70–99)
Potassium: 3.8 mmol/L (ref 3.5–5.1)
Sodium: 138 mmol/L (ref 135–145)
Total Bilirubin: 0.3 mg/dL (ref 0.3–1.2)
Total Protein: 6.4 g/dL — ABNORMAL LOW (ref 6.5–8.1)

## 2021-05-21 LAB — URINALYSIS, ROUTINE W REFLEX MICROSCOPIC
Bacteria, UA: NONE SEEN
Bilirubin Urine: NEGATIVE
Glucose, UA: NEGATIVE mg/dL
Ketones, ur: 5 mg/dL — AB
Leukocytes,Ua: NEGATIVE
Nitrite: NEGATIVE
Protein, ur: NEGATIVE mg/dL
Specific Gravity, Urine: 1.025 (ref 1.005–1.030)
pH: 6 (ref 5.0–8.0)

## 2021-05-21 LAB — CBC WITH DIFFERENTIAL/PLATELET
Abs Immature Granulocytes: 0.09 10*3/uL — ABNORMAL HIGH (ref 0.00–0.07)
Basophils Absolute: 0.1 10*3/uL (ref 0.0–0.1)
Basophils Relative: 0 %
Eosinophils Absolute: 0.1 10*3/uL (ref 0.0–0.5)
Eosinophils Relative: 1 %
HCT: 32.3 % — ABNORMAL LOW (ref 36.0–46.0)
Hemoglobin: 11.2 g/dL — ABNORMAL LOW (ref 12.0–15.0)
Immature Granulocytes: 1 %
Lymphocytes Relative: 15 %
Lymphs Abs: 2.5 10*3/uL (ref 0.7–4.0)
MCH: 32.7 pg (ref 26.0–34.0)
MCHC: 34.7 g/dL (ref 30.0–36.0)
MCV: 94.2 fL (ref 80.0–100.0)
Monocytes Absolute: 0.6 10*3/uL (ref 0.1–1.0)
Monocytes Relative: 4 %
Neutro Abs: 12.7 10*3/uL — ABNORMAL HIGH (ref 1.7–7.7)
Neutrophils Relative %: 79 %
Platelets: 250 10*3/uL (ref 150–400)
RBC: 3.43 MIL/uL — ABNORMAL LOW (ref 3.87–5.11)
RDW: 12.5 % (ref 11.5–15.5)
WBC: 16 10*3/uL — ABNORMAL HIGH (ref 4.0–10.5)
nRBC: 0 % (ref 0.0–0.2)

## 2021-05-21 IMAGING — US US RENAL
1 series · 16 of 25 positions shown · non-contrast
Comparison: None Available.

CLINICAL DATA: Abdominal pain.  Pregnant.

EXAM:
RENAL / URINARY TRACT ULTRASOUND COMPLETE

[Series 1: us renal · 32 acquisitions, 16 frames shown]
[im 1/32]
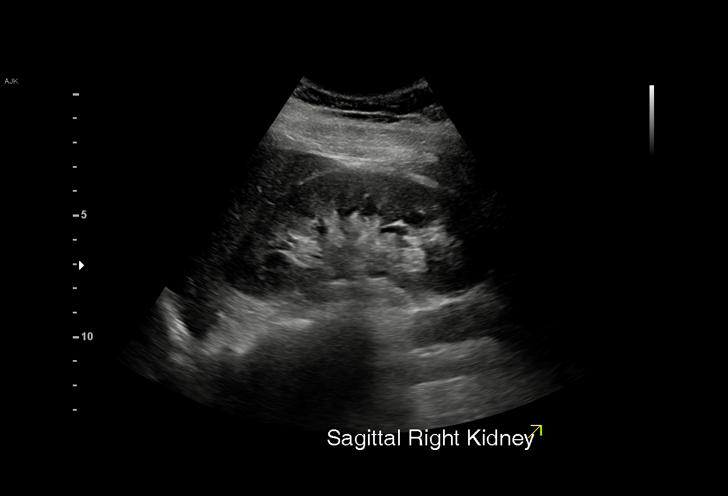
[im 3/32]
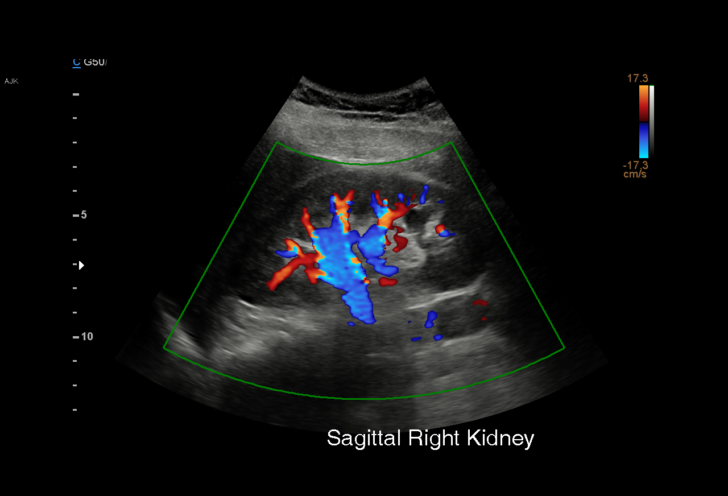
[im 4/32]
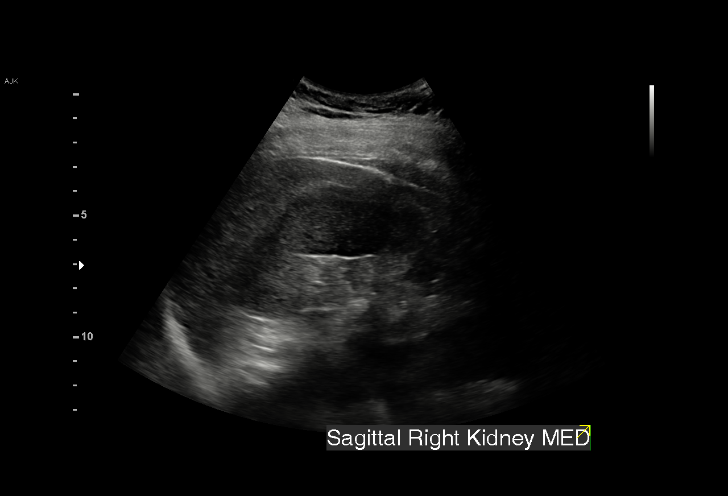
[im 7/32]
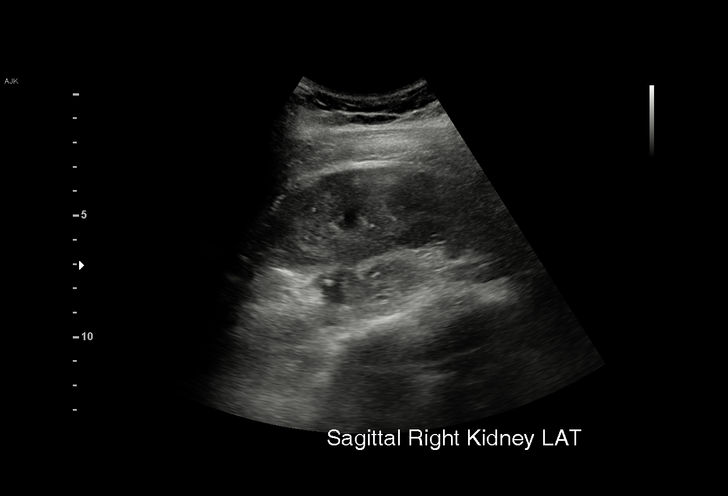
[im 10/32]
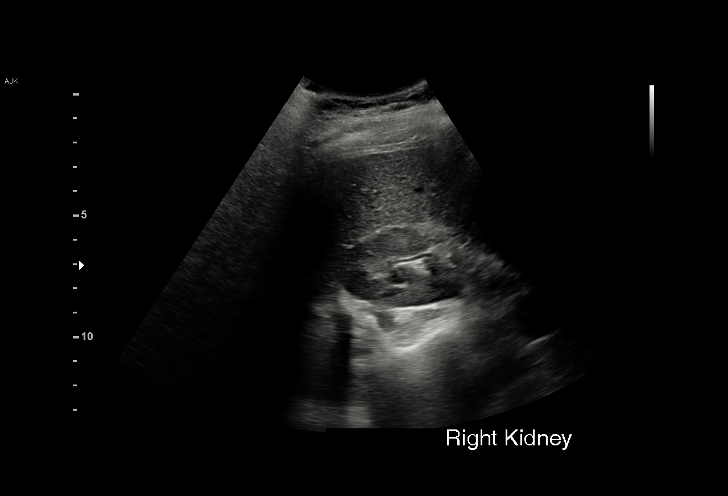
[im 11/32]
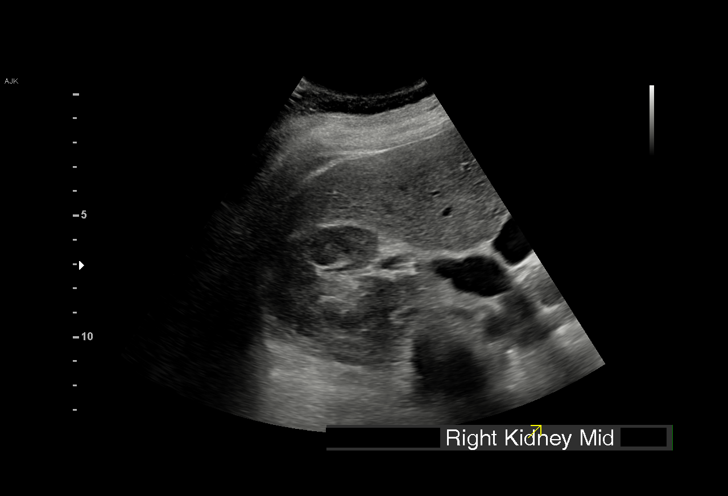
[im 13/32]
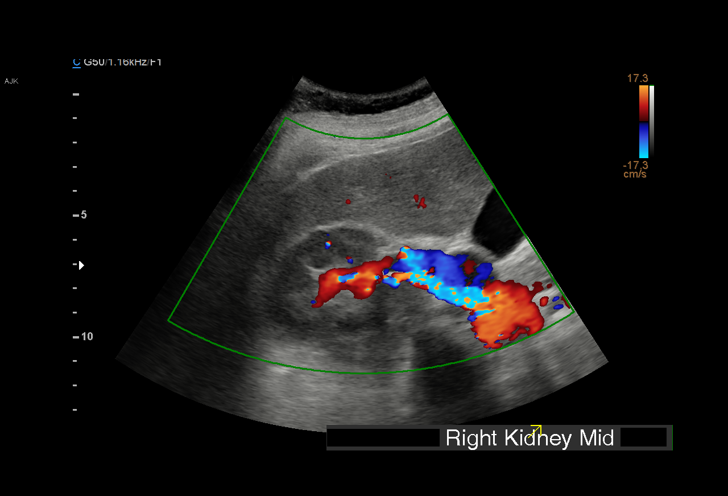
[im 15/32]
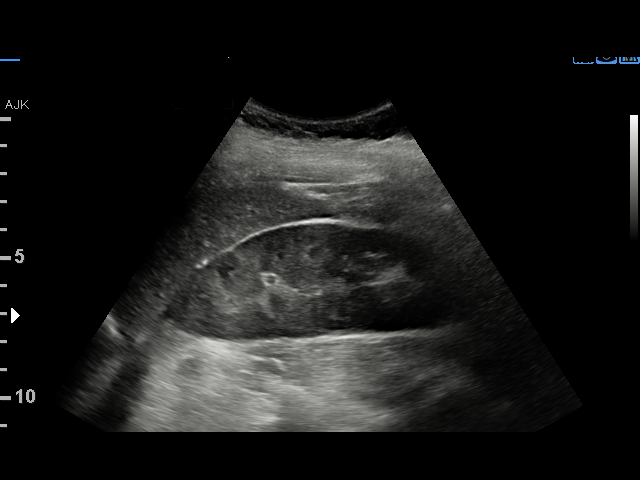
[im 17/32]
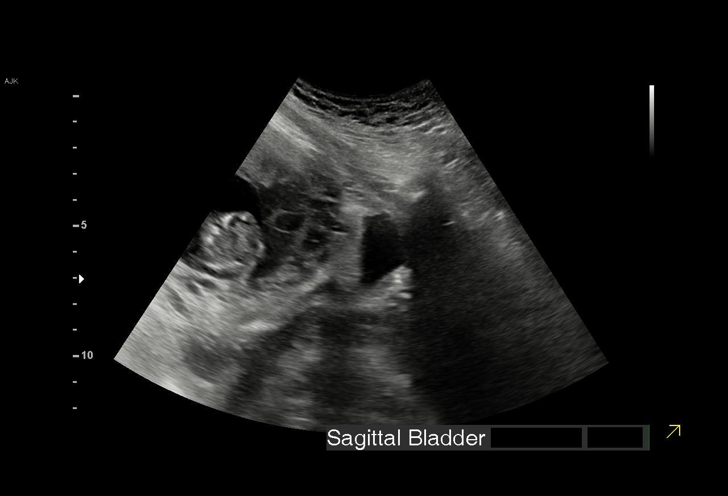
[im 19/32]
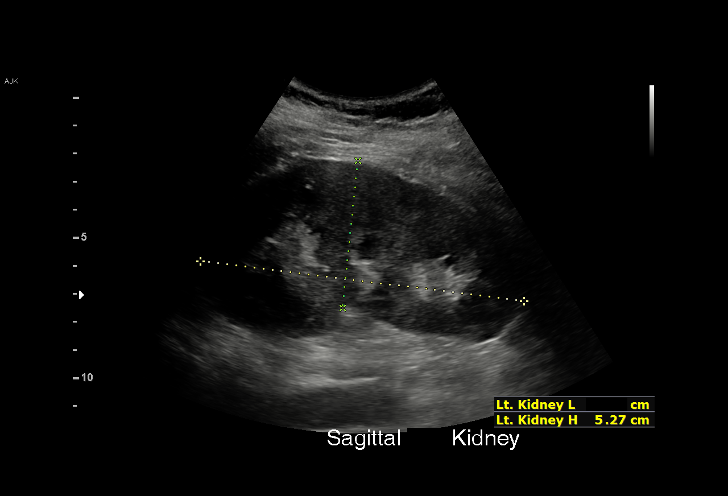
[im 21/32]
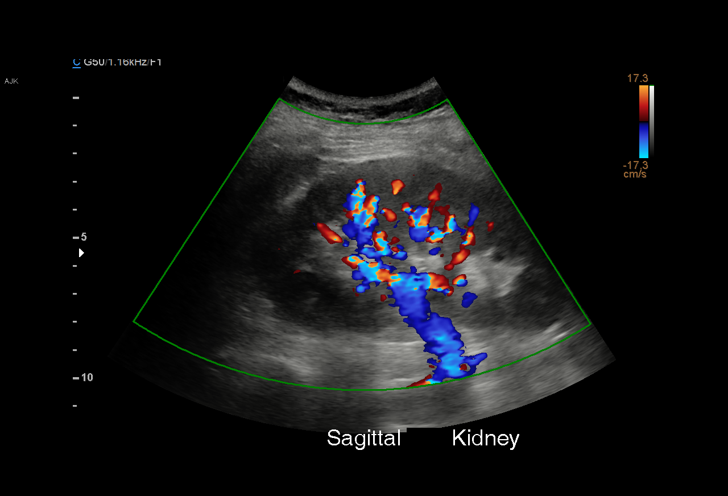
[im 22/32]
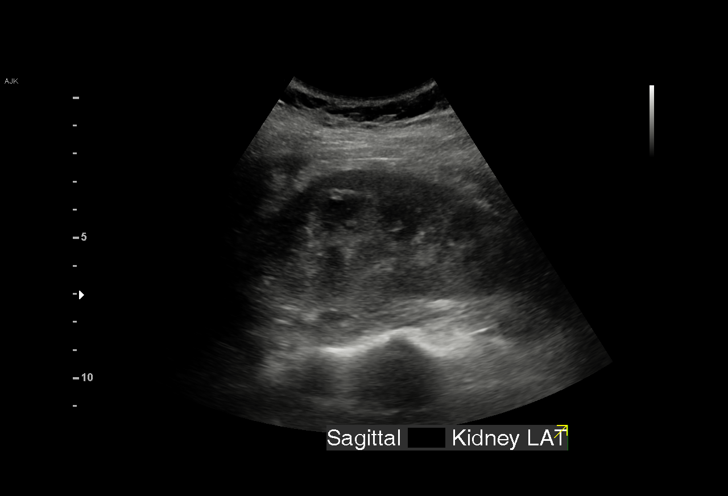
[im 25/32]
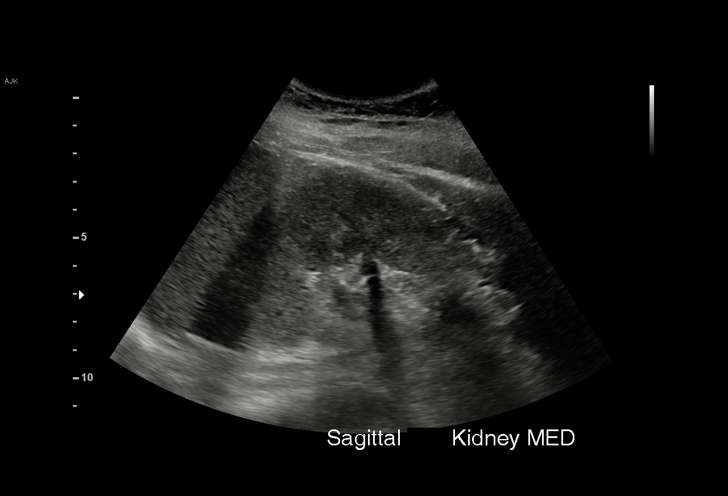
[im 28/32]
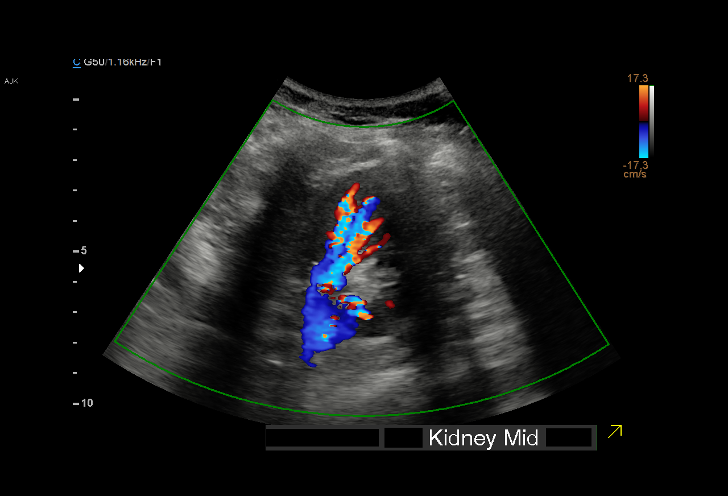
[im 29/32]
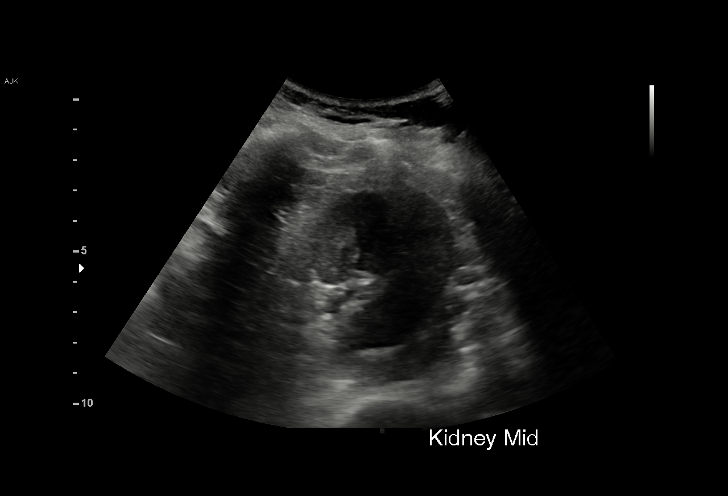
[im 32/32]
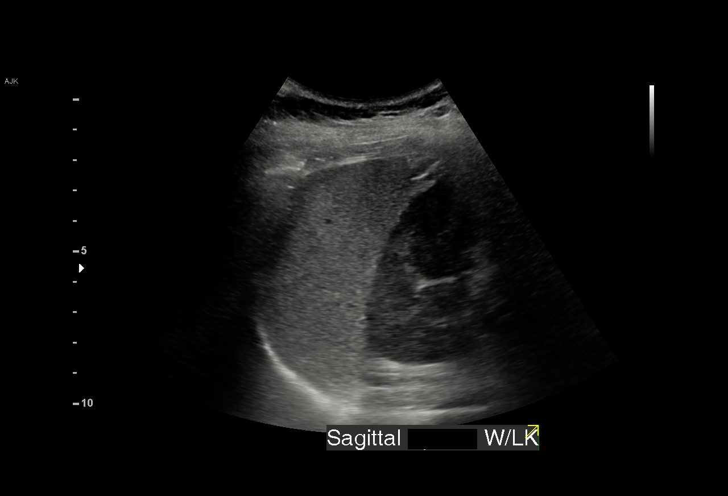

[16 of 25 positions shown; findings below may reference images not displayed]

FINDINGS: Right Kidney:

Renal measurements: 11.4 x 4.6 x 4.9 cm = volume: 135 mL.
Echogenicity within normal limits. No mass or hydronephrosis
visualized.

Left Kidney:

Renal measurements: 11.6 x 5.3 x 4.3 cm = volume: 138 mL.
Echogenicity within normal limits. No mass or hydronephrosis
visualized.

Bladder:

Not well evaluated, patient recently voided.

Other:

None.
IMPRESSION: Bilateral kidneys appear within normal limits.

## 2021-05-21 IMAGING — US US ABDOMEN LIMITED
1 series · 15 of 25 positions shown · non-contrast
Comparison: None Available.

CLINICAL DATA: Abdominal pain since last night. Pregnant patient at
11 weeks 6 days gestation.

EXAM:
ULTRASOUND ABDOMEN LIMITED RIGHT UPPER QUADRANT

[Series 1: us abdomen limited · 15 of 45 slices shown]
[im 1/45]
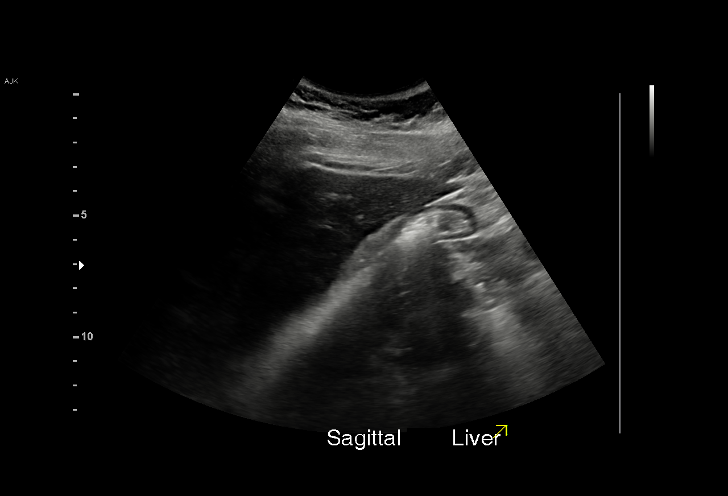
[im 4/45]
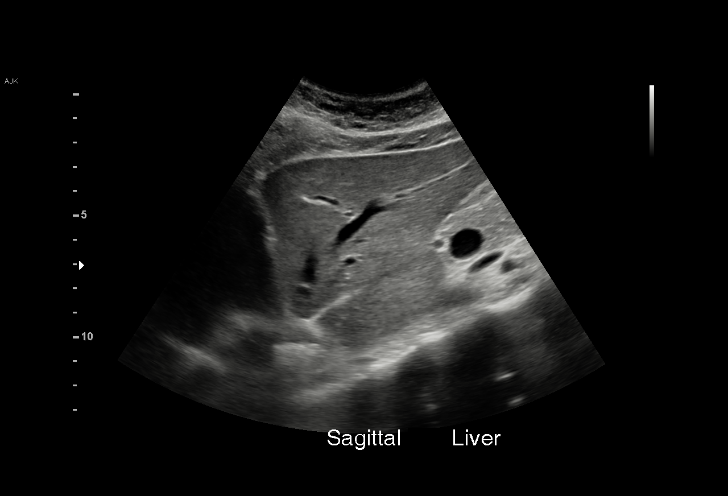
[im 8/45]
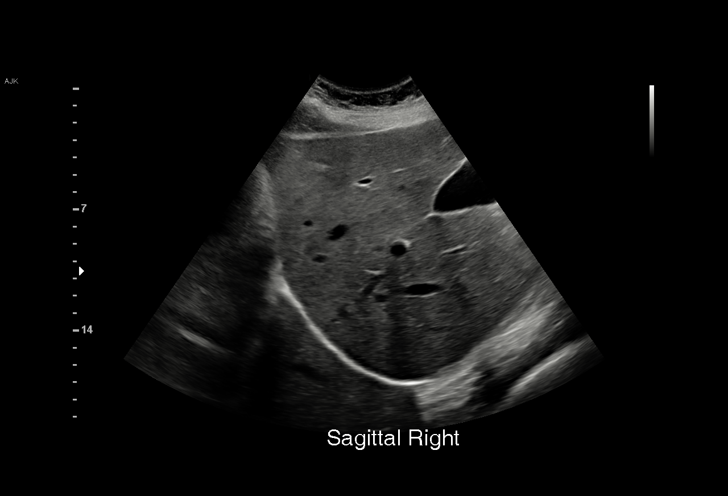
[im 10/45]
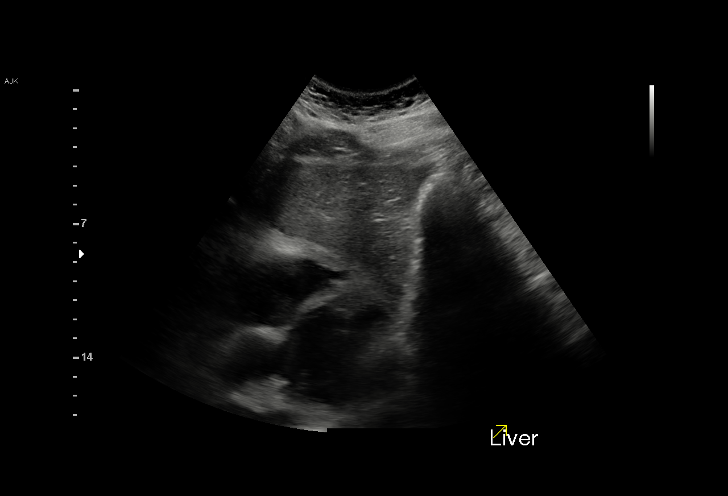
[im 13/45]
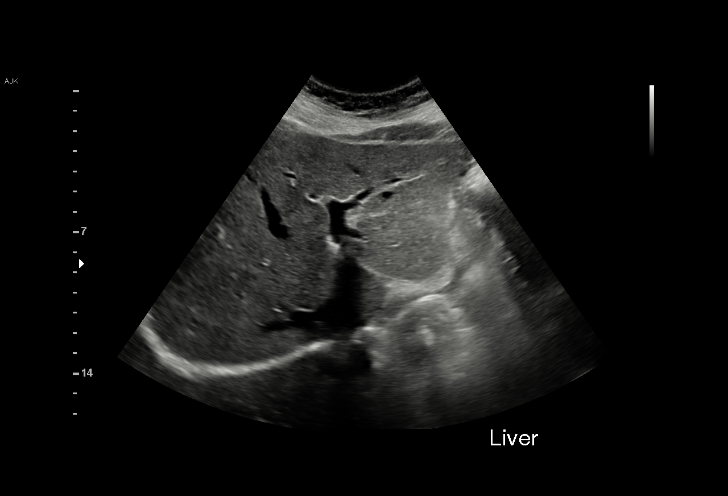
[im 17/45]
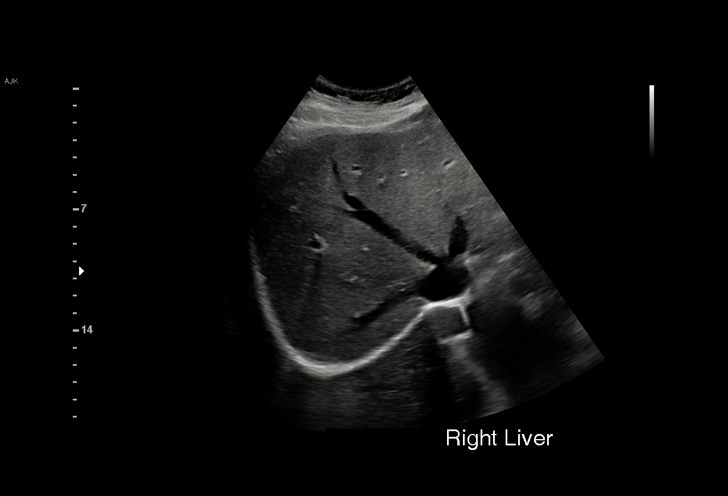
[im 19/45]
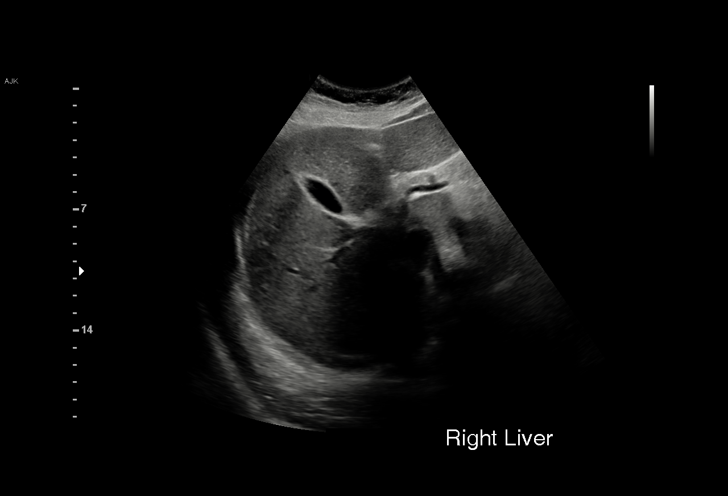
[im 23/45]
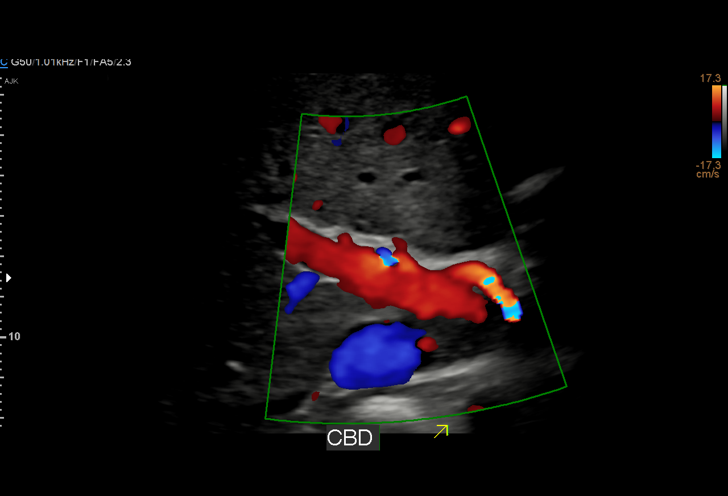
[im 26/45]
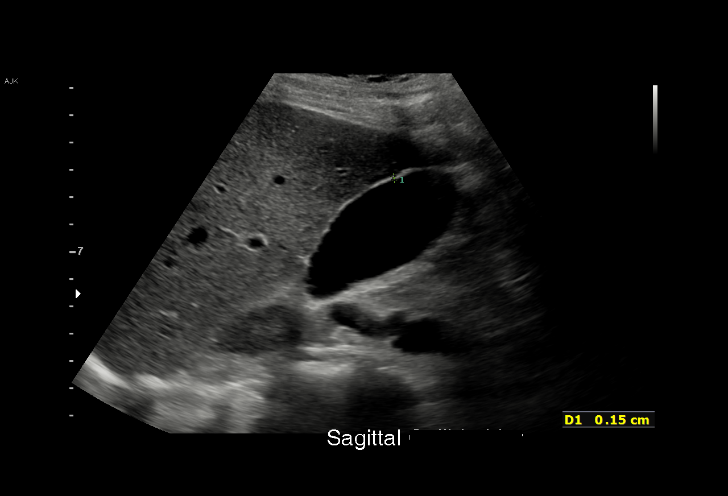
[im 28/45]
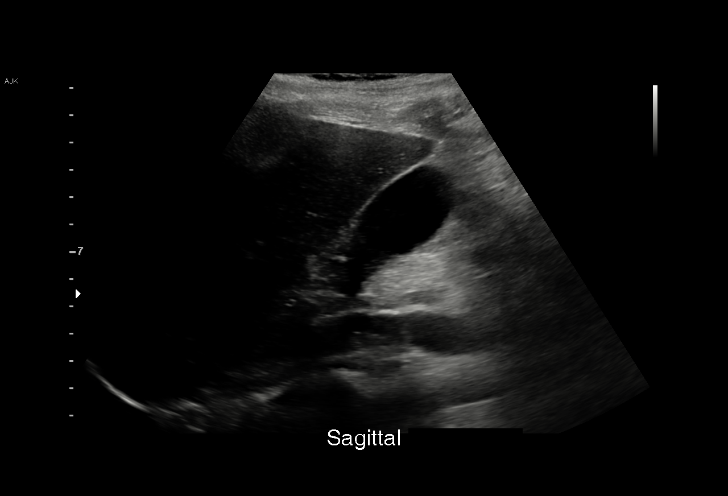
[im 32/45]
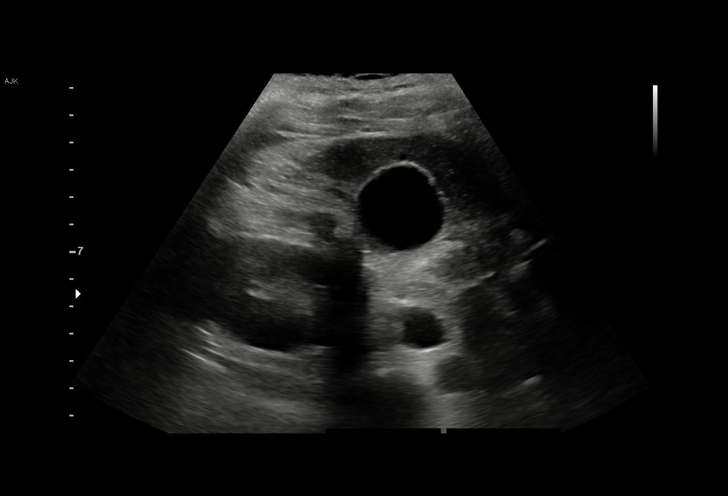
[im 35/45]
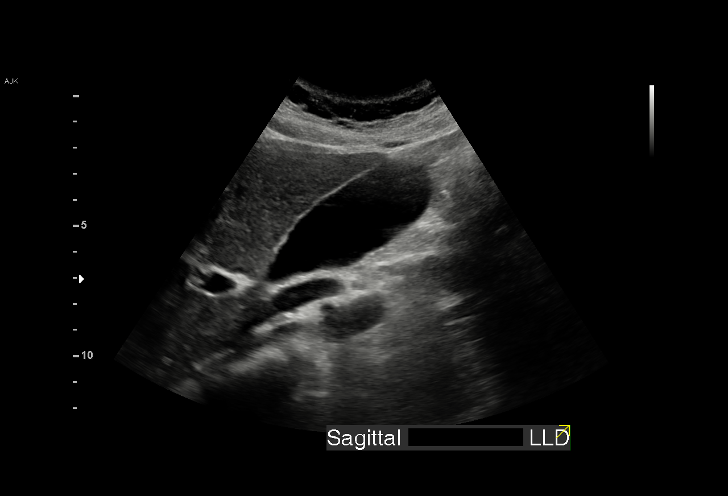
[im 37/45]
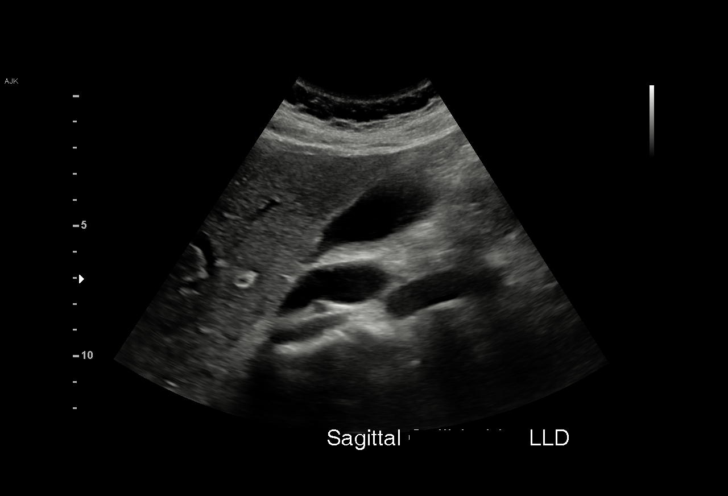
[im 41/45]
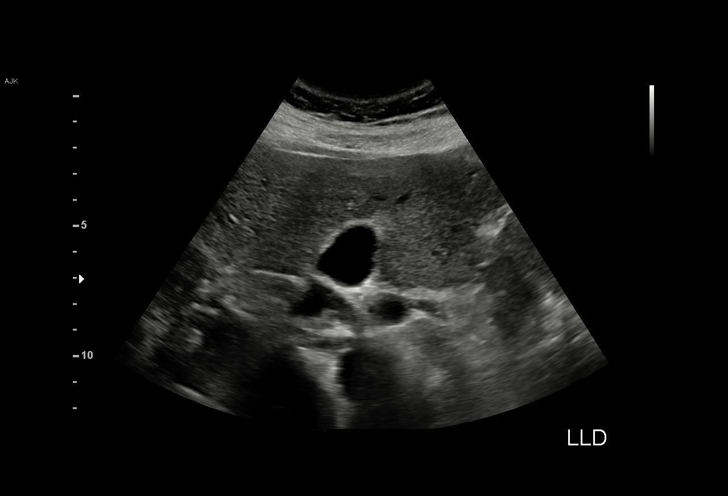
[im 45/45]
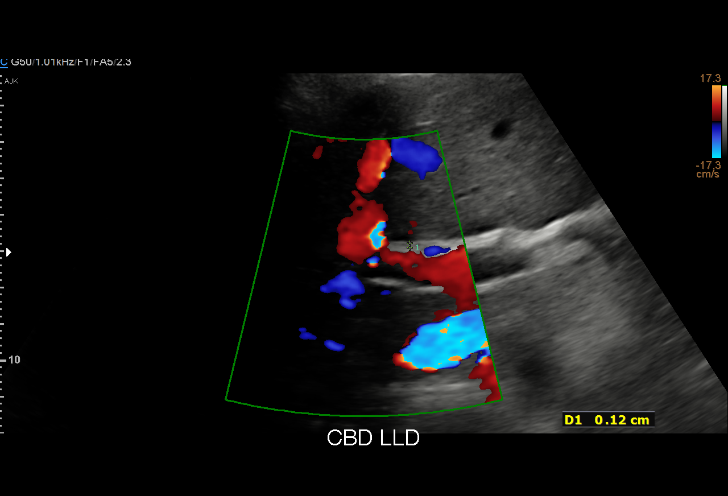

[15 of 25 positions shown; findings below may reference images not displayed]

FINDINGS: Gallbladder:

Physiologically distended. No gallstones or wall thickening
visualized. No sonographic Murphy sign noted by sonographer.

Common bile duct:

Diameter: 2 mm, normal.

Liver:

No focal lesion identified. Within normal limits in parenchymal
echogenicity. Portal vein is patent on color Doppler imaging with
normal direction of blood flow towards the liver.

Other: No right upper quadrant ascites.
IMPRESSION: Normal sonographic appearance of the liver, gallbladder, and biliary
tree.

## 2021-05-21 MED ORDER — IBUPROFEN 600 MG PO TABS
600.0000 mg | ORAL_TABLET | Freq: Once | ORAL | Status: AC
  Administered 2021-05-21: 600 mg via ORAL
  Filled 2021-05-21: qty 1

## 2021-05-21 MED ORDER — FAMOTIDINE IN NACL 20-0.9 MG/50ML-% IV SOLN
20.0000 mg | Freq: Once | INTRAVENOUS | Status: AC
  Administered 2021-05-21: 20 mg via INTRAVENOUS
  Filled 2021-05-21: qty 50

## 2021-05-21 MED ORDER — ONDANSETRON 8 MG PO TBDP: 8.0000 mg | ORAL_TABLET | Freq: Three times a day (TID) | ORAL | 0 refills | Status: DC | PRN

## 2021-05-21 MED ORDER — LACTATED RINGERS IV BOLUS
1000.0000 mL | Freq: Once | INTRAVENOUS | Status: AC
  Administered 2021-05-21: 1000 mL via INTRAVENOUS

## 2021-05-21 MED ORDER — SODIUM CHLORIDE 0.9 % IV SOLN
8.0000 mg | Freq: Once | INTRAVENOUS | Status: AC
  Administered 2021-05-21: 8 mg via INTRAVENOUS
  Filled 2021-05-21: qty 4

## 2021-05-21 NOTE — MAU Note (Signed)
Pt states after BM her cramping has decreased ?

## 2021-05-21 NOTE — MAU Note (Signed)
Patient reports pain level down to 7-when asked it was the same cramping-"a little". ?

## 2021-05-21 NOTE — MAU Note (Signed)
Pt vomited 100 ml light yellow emesis. ?
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
## 2021-05-21 NOTE — MAU Note (Addendum)
Pt voiced concern that the medications ordered will not help her upper abdominal cramping.Will discuss with provider. Also states she feels the need to have BM and has not been able to go. Last BM 05/20/2021 ?

## 2021-05-21 NOTE — MAU Note (Signed)
..  Tina Gibbs is a 21 y.o. at 23w6dhere in MAU reporting: Upper abdominal cramping since last night, took tylenol around 4 pm. Had some spotting last night, none now. Was in a hot shower and felt lightheaded.  ?LMP: 02/08/2021 ?Onset of complaint: last night ?Pain score: 8/10 ?Vitals:  ? 05/21/21 2001  ?BP: 125/69  ?Pulse: 85  ?Resp: 17  ?Temp: 98.7 ?F (37.1 ?C)  ?SpO2: 100%  ?   ?FHT:161 ?Lab orders placed from triage: UA ? ?

## 2021-05-21 NOTE — MAU Note (Signed)
Pt reported upper abdominal pain and cramping has returned. Provider at bedside. See new orders. ?

## 2021-05-22 ENCOUNTER — Inpatient Hospital Stay (HOSPITAL_COMMUNITY): Payer: Medicaid Other

## 2021-05-22 ENCOUNTER — Encounter (HOSPITAL_COMMUNITY): Payer: Self-pay | Admitting: Obstetrics and Gynecology

## 2021-05-22 ENCOUNTER — Other Ambulatory Visit: Payer: Self-pay

## 2021-05-22 ENCOUNTER — Inpatient Hospital Stay (HOSPITAL_COMMUNITY)
Admission: AD | Admit: 2021-05-22 | Discharge: 2021-05-22 | Disposition: A | Discharge status: Home / Self Care | Payer: Medicaid Other | Attending: Obstetrics and Gynecology | Admitting: Obstetrics and Gynecology

## 2021-05-22 DIAGNOSIS — O210 Mild hyperemesis gravidarum: Secondary | ICD-10-CM | POA: Diagnosis not present

## 2021-05-22 DIAGNOSIS — O99511 Diseases of the respiratory system complicating pregnancy, first trimester: Secondary | ICD-10-CM | POA: Diagnosis not present

## 2021-05-22 DIAGNOSIS — F1290 Cannabis use, unspecified, uncomplicated: Secondary | ICD-10-CM | POA: Diagnosis not present

## 2021-05-22 DIAGNOSIS — Z3A12 12 weeks gestation of pregnancy: Secondary | ICD-10-CM | POA: Diagnosis not present

## 2021-05-22 DIAGNOSIS — O99321 Drug use complicating pregnancy, first trimester: Secondary | ICD-10-CM | POA: Insufficient documentation

## 2021-05-22 DIAGNOSIS — R112 Nausea with vomiting, unspecified: Secondary | ICD-10-CM

## 2021-05-22 DIAGNOSIS — O26891 Other specified pregnancy related conditions, first trimester: Secondary | ICD-10-CM | POA: Insufficient documentation

## 2021-05-22 LAB — RAPID URINE DRUG SCREEN, HOSP PERFORMED
Amphetamines: NOT DETECTED
Barbiturates: NOT DETECTED
Benzodiazepines: NOT DETECTED
Cocaine: NOT DETECTED
Opiates: NOT DETECTED
Tetrahydrocannabinol: POSITIVE — AB

## 2021-05-22 LAB — URINALYSIS, ROUTINE W REFLEX MICROSCOPIC
Bacteria, UA: NONE SEEN
Bilirubin Urine: NEGATIVE
Glucose, UA: >=500 mg/dL — AB
Ketones, ur: 80 mg/dL — AB
Leukocytes,Ua: NEGATIVE
Nitrite: NEGATIVE
Protein, ur: 30 mg/dL — AB
Specific Gravity, Urine: 1.029 (ref 1.005–1.030)
pH: 6 (ref 5.0–8.0)

## 2021-05-22 LAB — LIPASE, BLOOD: Lipase: 28 U/L (ref 11–51)

## 2021-05-22 IMAGING — MR MR ABDOMEN W/O CM
9 series · 48 of 48 positions shown · non-contrast
Comparison: No priors.

CLINICAL DATA: 20-year-old female with history of nausea, vomiting
and abdominal pain.

EXAM:
MRI ABDOMEN WITHOUT CONTRAST
TECHNIQUE: Multiplanar multisequence MR imaging was performed without the
administration of intravenous contrast.

[Series 5: cor haste · coronal · 6.0mm · 1.19mm/px · 3 of 28 slices shown]
[im 1/28]
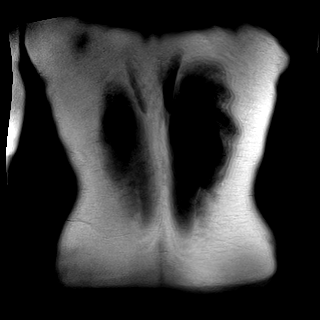
[im 14/28]
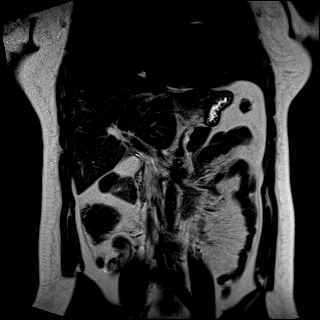
[im 28/28]
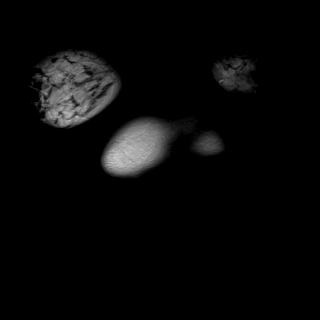

[Series 6: ax haste · axial · 6.0mm · 1.19mm/px · z∈[-105,+118]mm · 3 of 32 slices shown]
[im 1/32]
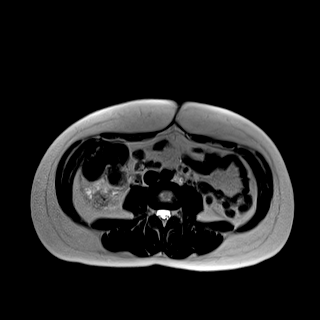
[im 16/32]
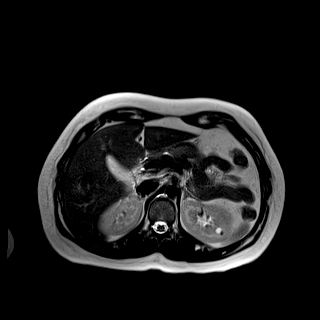
[im 32/32]
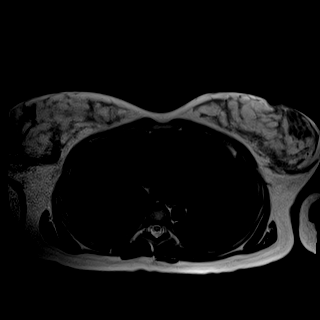

[Series 9: T2 fat-sat · axial · 6.0mm · 1.19mm/px · z∈[-105,+118]mm · 3 of 32 slices shown]
[im 1/32]
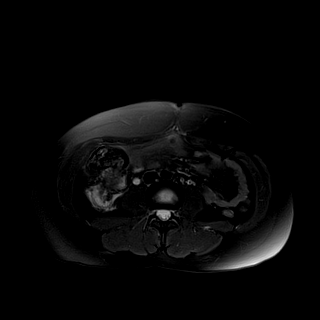
[im 16/32]
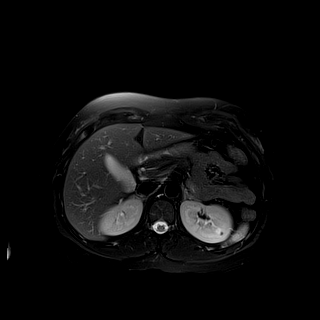
[im 32/32]
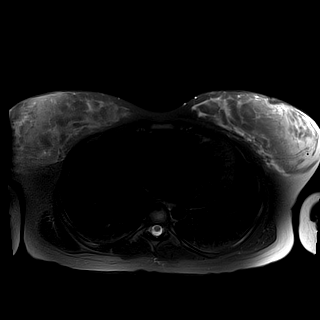

[Series 10: t1_vibe_opp-in_tra_p4_bh · axial · 3.0mm · 1.19mm/px · z∈[-112,+125]mm · 8 of 80 slices shown (1 of 2)]
[im 1/80]
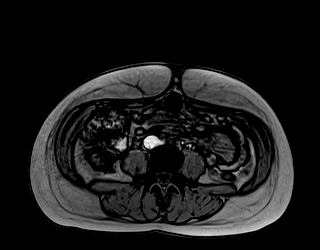
[im 12/80]
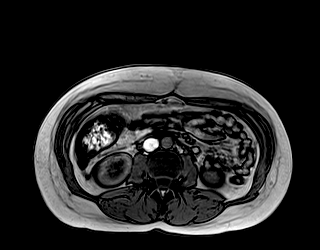
[im 23/80]
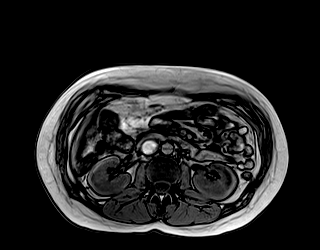
[im 34/80]
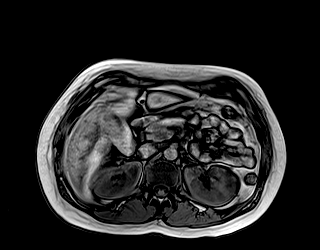
[im 46/80]
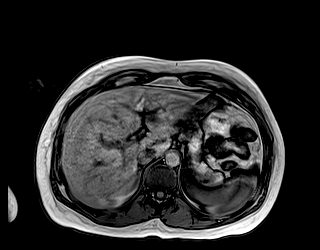
[im 57/80]
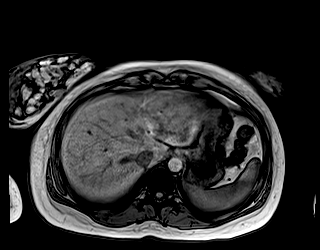
[im 68/80]
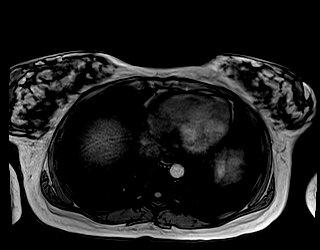
[im 80/80]
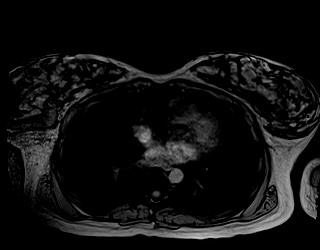

[Series 10: t1_vibe_opp-in_tra_p4_bh · axial · 3.0mm · 1.19mm/px · z∈[-112,+125]mm · 8 of 80 slices shown (2 of 2)]
[im 1/80]
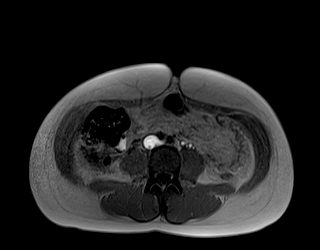
[im 12/80]
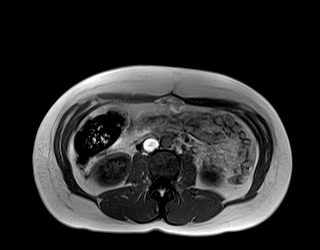
[im 23/80]
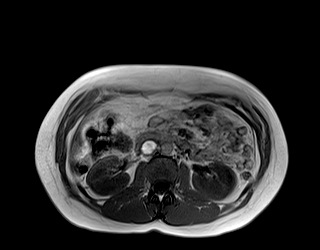
[im 34/80]
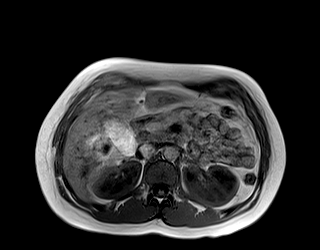
[im 46/80]
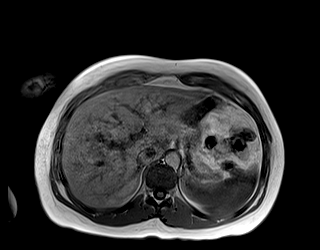
[im 57/80]
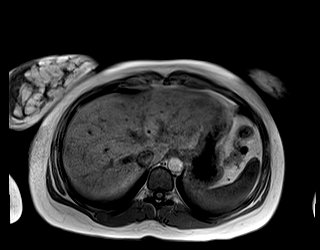
[im 68/80]
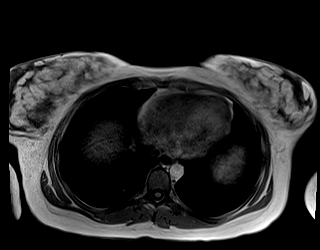
[im 80/80]
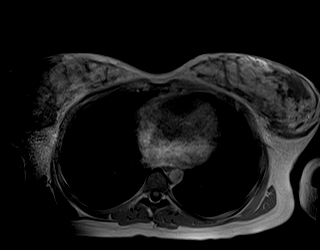

[Series 11: DWI · axial · 6.0mm · 1.42mm/px · z∈[-105,+118]mm · 9 of 96 slices shown (1 of 2)]
[im 1/96]
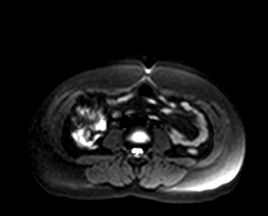
[im 12/96]
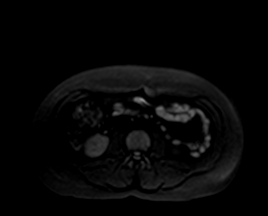
[im 24/96]
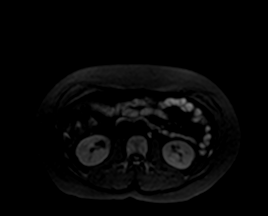
[im 36/96]
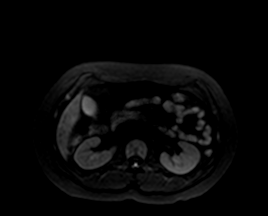
[im 48/96]
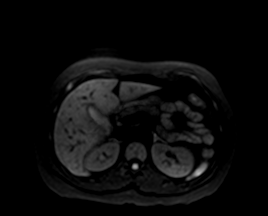
[im 60/96]
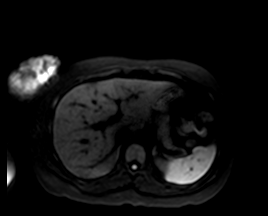
[im 72/96]
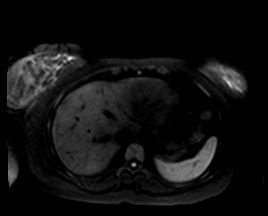
[im 84/96]
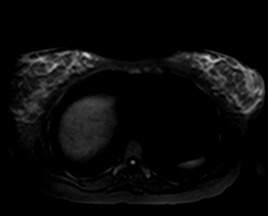
[im 96/96]
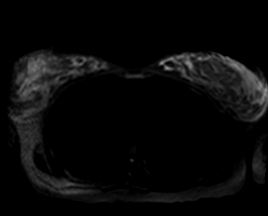

[Series 12: DWI · axial · 6.0mm · 1.42mm/px · z∈[-105,+118]mm · 3 of 32 slices shown (2 of 2)]
[im 1/32]
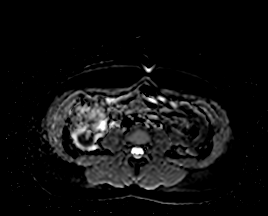
[im 16/32]
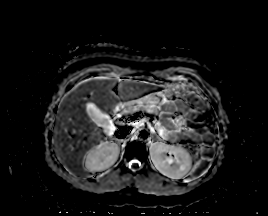
[im 32/32]
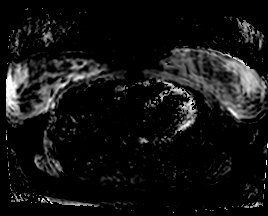

[Series 13: bSSFP · axial · 6.0mm · 0.74mm/px · z∈[-105,+118]mm · 3 of 32 slices shown]
[im 1/32]
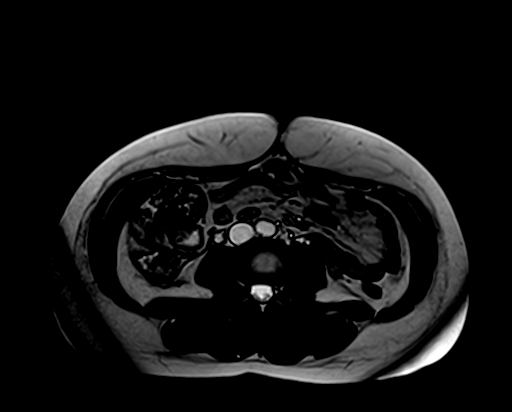
[im 16/32]
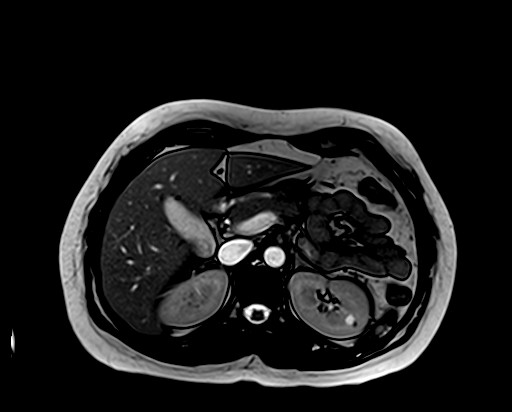
[im 32/32]
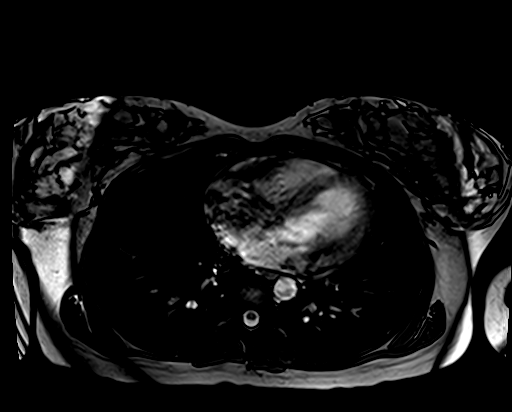

[Series 14: t1_vibe_fs_tra_p4_bh_pre · axial · 3.0mm · 1.19mm/px · z∈[-112,+125]mm · 8 of 80 slices shown]
[im 1/80]
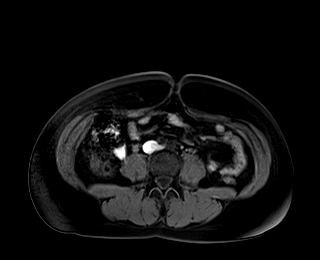
[im 12/80]
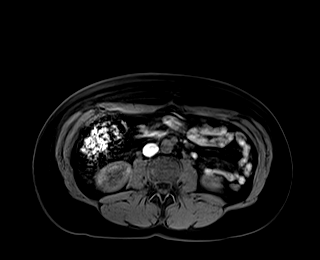
[im 23/80]
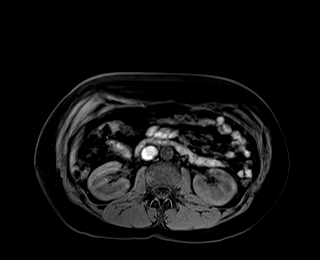
[im 34/80]
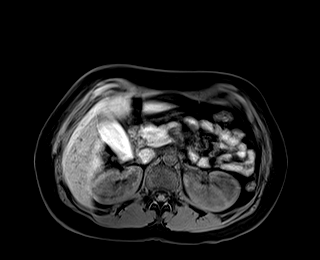
[im 46/80]
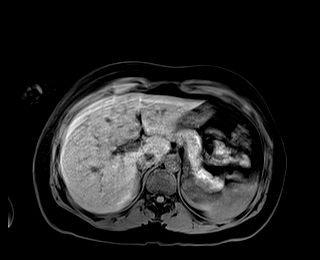
[im 57/80]
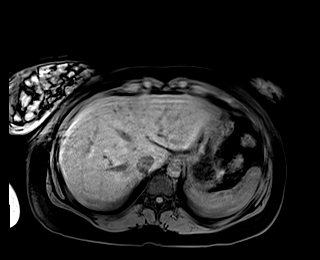
[im 68/80]
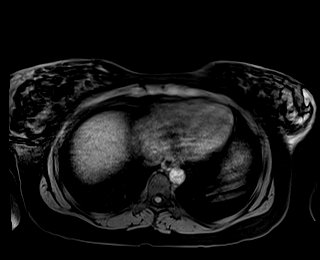
[im 80/80]
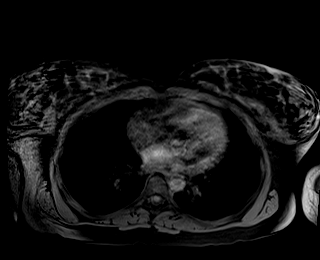

[48 of 48 positions shown; findings below may reference images not displayed]

FINDINGS: Comment: Today's study is limited for detection and characterization
of visceral and/or vascular lesions by lack of IV gadolinium.

Lower chest: Unremarkable.

Hepatobiliary: No definite suspicious cystic or solid hepatic
lesions are confidently identified on today's noncontrast
examination. No intra or extrahepatic biliary ductal dilatation.
Gallbladder is normal in appearance.

Pancreas: No definite pancreatic mass or peripancreatic fluid
collections or inflammatory changes noted on today's noncontrast
examination. No pancreatic ductal dilatation.

Spleen:  Unremarkable.

Adrenals/Urinary Tract: Bilateral kidneys and adrenal glands are
normal in appearance. No hydroureteronephrosis in the visualized
portions of the abdomen.

Stomach/Bowel: Visualized portions are unremarkable.

Vascular/Lymphatic: No aneurysm identified in the visualized
abdominal vasculature. No lymphadenopathy noted in the abdomen.

Other: No significant volume of ascites noted in the visualized
portions of the peritoneal cavity.

Musculoskeletal: No aggressive appearing osseous lesions are noted
in the visualized portions of the skeleton.
IMPRESSION: 1. No findings in the abdomen on today's noncontrast examination to
account for the patient's symptoms.

## 2021-05-22 MED ORDER — LACTATED RINGERS IV BOLUS
1000.0000 mL | Freq: Once | INTRAVENOUS | Status: AC
  Administered 2021-05-22: 1000 mL via INTRAVENOUS

## 2021-05-22 MED ORDER — HALOPERIDOL LACTATE 5 MG/ML IJ SOLN
5.0000 mg | Freq: Four times a day (QID) | INTRAMUSCULAR | Status: DC | PRN
  Administered 2021-05-22: 5 mg via INTRAVENOUS
  Filled 2021-05-22 (×3): qty 1

## 2021-05-22 MED ORDER — DIPHENHYDRAMINE HCL 50 MG/ML IJ SOLN
25.0000 mg | Freq: Once | INTRAMUSCULAR | Status: AC
  Administered 2021-05-22: 25 mg via INTRAVENOUS
  Filled 2021-05-22: qty 1

## 2021-05-22 NOTE — MAU Note (Signed)
Tina Gibbs is a 21 y.o. at 41w0dhere in MAU reporting: ongoing pain and nausea and vomiting since last night. Pain is on the right mid side of her abdomen. States she vomits every hour. Took zofran at 4 this morning and states it did help with the emesis. Tried tylenol for pain with no relief. No bleeding or abnormal discharge. ? ?Onset of complaint: ongoing ? ?Pain score: 10/10 ? ?Vitals:  ? 05/22/21 0851  ?BP: 129/76  ?Pulse: 70  ?Resp: 18  ?Temp: 98.7 ?F (37.1 ?C)  ?SpO2: 97%  ?   ?FHT:146 ? ?Lab orders placed from triage: UA ? ?

## 2021-05-22 NOTE — Discharge Instructions (Signed)
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
## 2021-05-22 NOTE — Progress Notes (Signed)
RN in attendance at bedside. Patient appears to be sleep. Patient stated that she has not slept,  and in pain. . Patient dozed back to sleep. Provider made aware and attended bedside for evaluation. Patient stated that she is tired because she has not been able to sleep. But she requested pain medications. ?RN cleared  to administer medications as per provider. ?

## 2021-05-23 LAB — CULTURE, OB URINE: Culture: <10000 — AB

## 2021-05-24 ENCOUNTER — Inpatient Hospital Stay (HOSPITAL_COMMUNITY): Payer: Medicaid Other | Admitting: Anesthesiology

## 2021-05-24 ENCOUNTER — Encounter (HOSPITAL_COMMUNITY): Payer: Self-pay | Admitting: Obstetrics and Gynecology

## 2021-05-24 ENCOUNTER — Inpatient Hospital Stay (HOSPITAL_COMMUNITY)
Admission: AD | Admit: 2021-05-24 | Discharge: 2021-06-01 | DRG: 817 | Disposition: A | Discharge status: Home / Self Care | Payer: Medicaid Other | Attending: General Surgery | Admitting: General Surgery

## 2021-05-24 ENCOUNTER — Inpatient Hospital Stay (HOSPITAL_COMMUNITY): Payer: Medicaid Other

## 2021-05-24 ENCOUNTER — Encounter (HOSPITAL_COMMUNITY): Admission: AD | Disposition: A | Discharge status: Home / Self Care | Payer: Self-pay | Source: Home / Self Care

## 2021-05-24 ENCOUNTER — Other Ambulatory Visit: Payer: Self-pay

## 2021-05-24 DIAGNOSIS — Z91040 Latex allergy status: Secondary | ICD-10-CM

## 2021-05-24 DIAGNOSIS — Z79899 Other long term (current) drug therapy: Secondary | ICD-10-CM | POA: Diagnosis not present

## 2021-05-24 DIAGNOSIS — E876 Hypokalemia: Secondary | ICD-10-CM | POA: Diagnosis present

## 2021-05-24 DIAGNOSIS — E739 Lactose intolerance, unspecified: Secondary | ICD-10-CM | POA: Diagnosis present

## 2021-05-24 DIAGNOSIS — R109 Unspecified abdominal pain: Secondary | ICD-10-CM | POA: Diagnosis present

## 2021-05-24 DIAGNOSIS — O99611 Diseases of the digestive system complicating pregnancy, first trimester: Secondary | ICD-10-CM | POA: Diagnosis present

## 2021-05-24 DIAGNOSIS — O99281 Endocrine, nutritional and metabolic diseases complicating pregnancy, first trimester: Secondary | ICD-10-CM | POA: Diagnosis present

## 2021-05-24 DIAGNOSIS — Z87891 Personal history of nicotine dependence: Secondary | ICD-10-CM

## 2021-05-24 DIAGNOSIS — K3533 Acute appendicitis with perforation and localized peritonitis, with abscess: Secondary | ICD-10-CM | POA: Diagnosis present

## 2021-05-24 DIAGNOSIS — K567 Ileus, unspecified: Secondary | ICD-10-CM | POA: Diagnosis not present

## 2021-05-24 DIAGNOSIS — O99511 Diseases of the respiratory system complicating pregnancy, first trimester: Secondary | ICD-10-CM | POA: Diagnosis present

## 2021-05-24 DIAGNOSIS — J45909 Unspecified asthma, uncomplicated: Secondary | ICD-10-CM | POA: Diagnosis present

## 2021-05-24 DIAGNOSIS — K381 Appendicular concretions: Secondary | ICD-10-CM | POA: Diagnosis present

## 2021-05-24 DIAGNOSIS — O99011 Anemia complicating pregnancy, first trimester: Secondary | ICD-10-CM | POA: Diagnosis present

## 2021-05-24 DIAGNOSIS — D62 Acute posthemorrhagic anemia: Secondary | ICD-10-CM | POA: Diagnosis not present

## 2021-05-24 DIAGNOSIS — Z3A12 12 weeks gestation of pregnancy: Secondary | ICD-10-CM | POA: Diagnosis not present

## 2021-05-24 DIAGNOSIS — K352 Acute appendicitis with generalized peritonitis, without abscess: Secondary | ICD-10-CM

## 2021-05-24 DIAGNOSIS — K3532 Acute appendicitis with perforation and localized peritonitis, without abscess: Secondary | ICD-10-CM | POA: Diagnosis not present

## 2021-05-24 HISTORY — PX: LAPAROSCOPIC APPENDECTOMY: SHX408

## 2021-05-24 LAB — COMPREHENSIVE METABOLIC PANEL
ALT: 12 U/L (ref 0–44)
AST: 13 U/L — ABNORMAL LOW (ref 15–41)
Albumin: 3.2 g/dL — ABNORMAL LOW (ref 3.5–5.0)
Alkaline Phosphatase: 45 U/L (ref 38–126)
Anion gap: 11 (ref 5–15)
BUN: 7 mg/dL (ref 6–20)
CO2: 22 mmol/L (ref 22–32)
Calcium: 9.1 mg/dL (ref 8.9–10.3)
Chloride: 101 mmol/L (ref 98–111)
Creatinine, Ser: 0.76 mg/dL (ref 0.44–1.00)
GFR, Estimated: >60 mL/min (ref >60)
Glucose, Bld: 113 mg/dL — ABNORMAL HIGH (ref 70–99)
Potassium: 2.8 mmol/L — ABNORMAL LOW (ref 3.5–5.1)
Sodium: 134 mmol/L — ABNORMAL LOW (ref 135–145)
Total Bilirubin: 0.9 mg/dL (ref 0.3–1.2)
Total Protein: 6.4 g/dL — ABNORMAL LOW (ref 6.5–8.1)

## 2021-05-24 LAB — URINALYSIS, ROUTINE W REFLEX MICROSCOPIC
Bilirubin Urine: NEGATIVE
Glucose, UA: NEGATIVE mg/dL
Ketones, ur: 20 mg/dL — AB
Leukocytes,Ua: NEGATIVE
Nitrite: NEGATIVE
Protein, ur: 30 mg/dL — AB
Specific Gravity, Urine: 1.021 (ref 1.005–1.030)
pH: 5 (ref 5.0–8.0)

## 2021-05-24 LAB — AMYLASE: Amylase: 24 U/L — ABNORMAL LOW (ref 28–100)

## 2021-05-24 LAB — CBC WITH DIFFERENTIAL/PLATELET
Abs Immature Granulocytes: 0.19 10*3/uL — ABNORMAL HIGH (ref 0.00–0.07)
Basophils Absolute: 0 10*3/uL (ref 0.0–0.1)
Basophils Relative: 0 %
Eosinophils Absolute: 0 10*3/uL (ref 0.0–0.5)
Eosinophils Relative: 0 %
HCT: 33.2 % — ABNORMAL LOW (ref 36.0–46.0)
Hemoglobin: 11.9 g/dL — ABNORMAL LOW (ref 12.0–15.0)
Immature Granulocytes: 1 %
Lymphocytes Relative: 5 %
Lymphs Abs: 0.8 10*3/uL (ref 0.7–4.0)
MCH: 33.1 pg (ref 26.0–34.0)
MCHC: 35.8 g/dL (ref 30.0–36.0)
MCV: 92.5 fL (ref 80.0–100.0)
Monocytes Absolute: 0.3 10*3/uL (ref 0.1–1.0)
Monocytes Relative: 2 %
Neutro Abs: 15.3 10*3/uL — ABNORMAL HIGH (ref 1.7–7.7)
Neutrophils Relative %: 92 %
Platelets: 248 10*3/uL (ref 150–400)
RBC: 3.59 MIL/uL — ABNORMAL LOW (ref 3.87–5.11)
RDW: 12.6 % (ref 11.5–15.5)
WBC: 16.7 10*3/uL — ABNORMAL HIGH (ref 4.0–10.5)
nRBC: 0 % (ref 0.0–0.2)

## 2021-05-24 LAB — TYPE AND SCREEN
ABO/RH(D): O POS
Antibody Screen: NEGATIVE

## 2021-05-24 LAB — LIPASE, BLOOD: Lipase: 23 U/L (ref 11–51)

## 2021-05-24 LAB — HIV ANTIBODY (ROUTINE TESTING W REFLEX): HIV Screen 4th Generation wRfx: NONREACTIVE

## 2021-05-24 IMAGING — MR MR ABDOMEN W/O CM
10 of 11 series · 43 of 48 positions shown · non-contrast
Comparison: [DATE].

CLINICAL DATA: Right lower quadrant abdominal pain in pregnancy.

EXAM:
MRI ABDOMEN AND PELVIS WITHOUT CONTRAST
TECHNIQUE: Multiplanar multisequence MR imaging of the abdomen and pelvis was
performed. No intravenous contrast was administered.

[Series 14: cor haste · coronal · 6.0mm · 1.19mm/px · 1 of 31 slices shown]
[im 1/31]
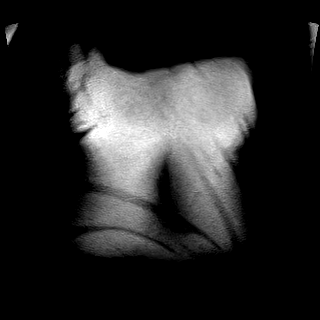

[Series 15: ax haste · axial · 6.0mm · 1.19mm/px · z∈[-105,+183]mm · 2 of 41 slices shown]
[im 1/41]
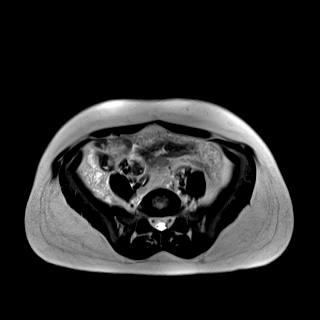
[im 41/41]
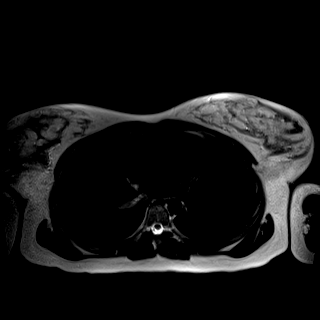

[Series 18: T2 fat-sat · axial · 6.0mm · 1.19mm/px · z∈[-105,+183]mm · 3 of 41 slices shown]
[im 1/41]
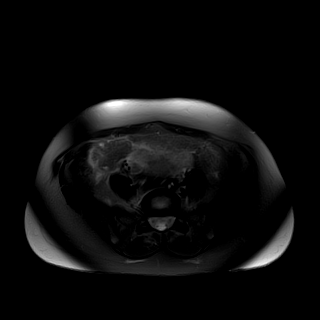
[im 21/41]
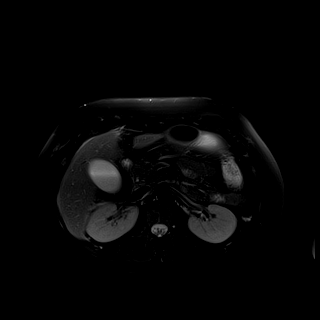
[im 41/41]
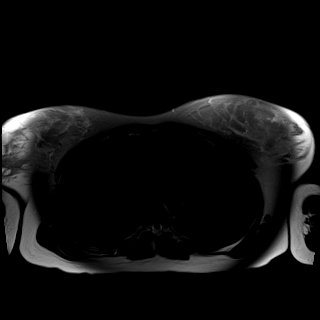

[Series 19: t1_vibe_opp-in_tra_p4_bh · axial · 3.0mm · 1.19mm/px · z∈[-107,+178]mm · 7 of 96 slices shown (1 of 2)]
[im 1/96]
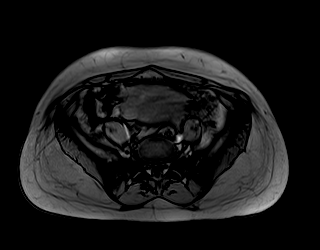
[im 16/96]
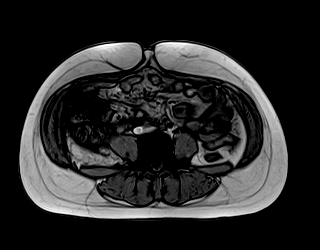
[im 32/96]
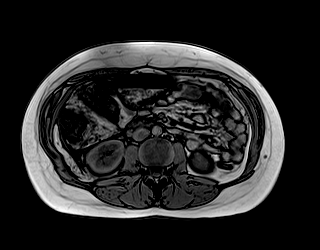
[im 48/96]
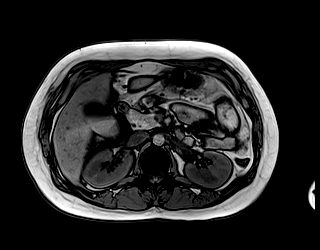
[im 64/96]
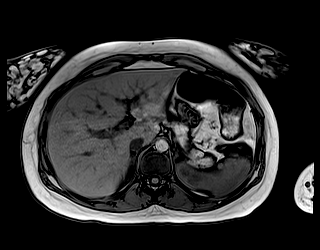
[im 80/96]
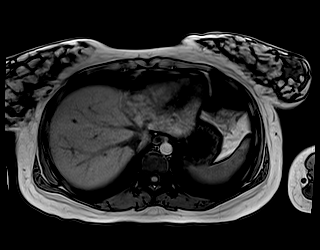
[im 96/96]
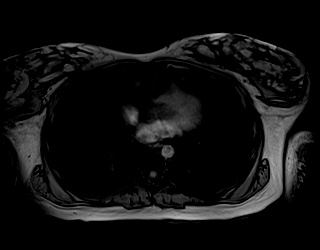

[Series 19: t1_vibe_opp-in_tra_p4_bh · axial · 3.0mm · 1.19mm/px · z∈[-107,+178]mm · 7 of 96 slices shown (2 of 2)]
[im 1/96]
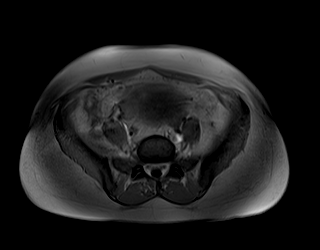
[im 16/96]
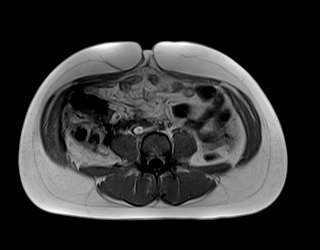
[im 32/96]
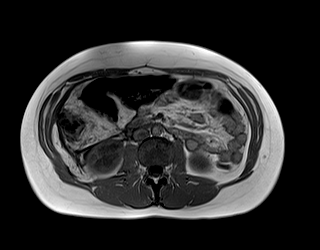
[im 48/96]
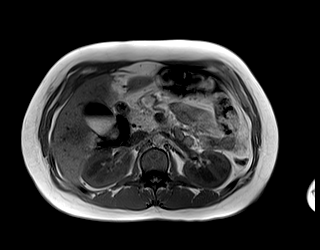
[im 64/96]
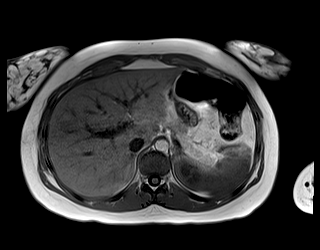
[im 80/96]
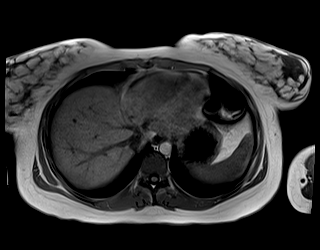
[im 96/96]
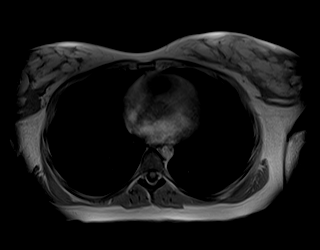

[Series 20: t1_tfl_in-phase_tra_trigresp · axial · 6.0mm · 0.74mm/px · z∈[-101,+187]mm · 3 of 41 slices shown]
[im 1/41]
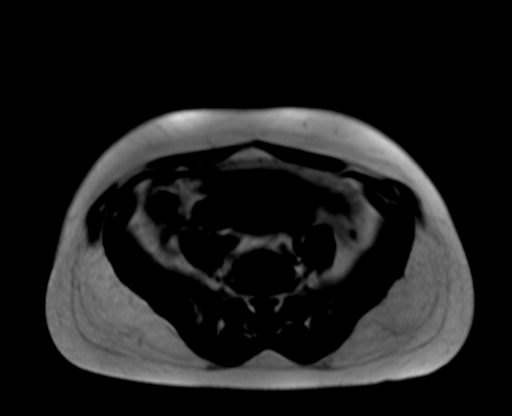
[im 21/41]
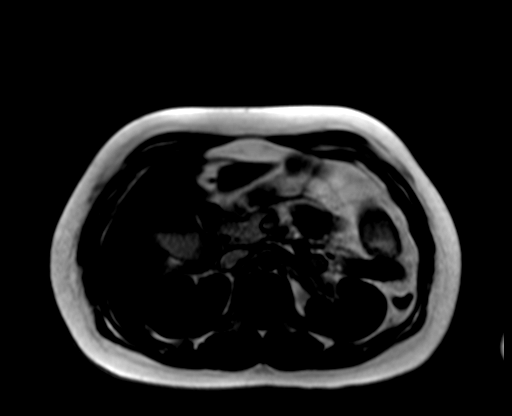
[im 41/41]
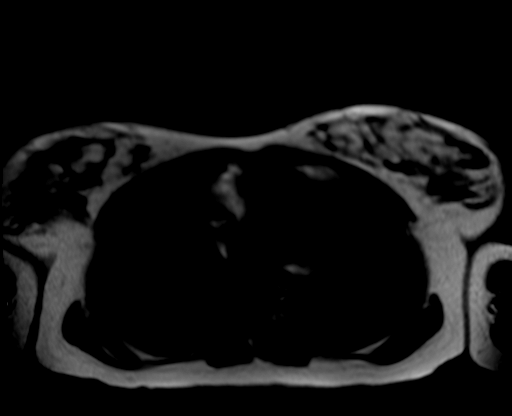

[Series 21: t1_tfl_opp-phase_tra_trigresp · axial · 6.0mm · 0.74mm/px · z∈[-101,+187]mm · 3 of 41 slices shown]
[im 1/41]
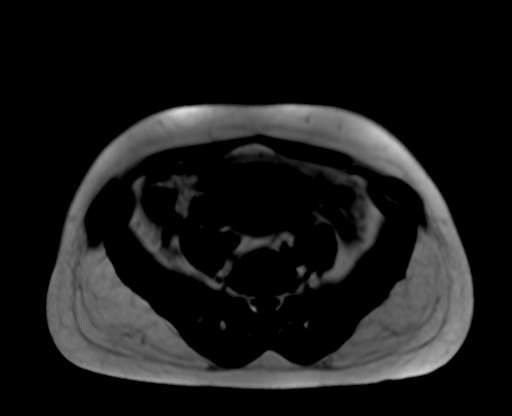
[im 21/41]
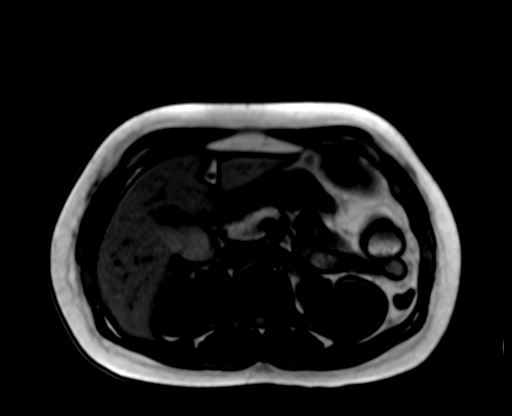
[im 41/41]
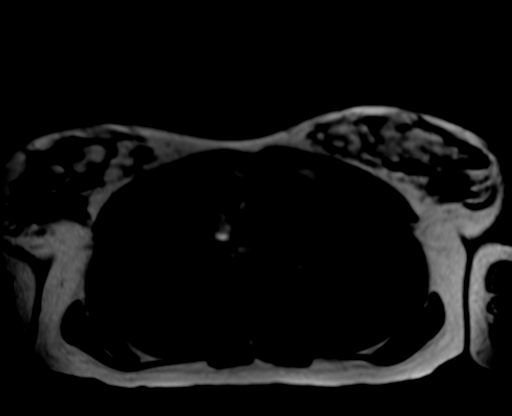

[Series 22: bSSFP · axial · 6.0mm · 0.74mm/px · z∈[-101,+187]mm · 3 of 41 slices shown]
[im 1/41]
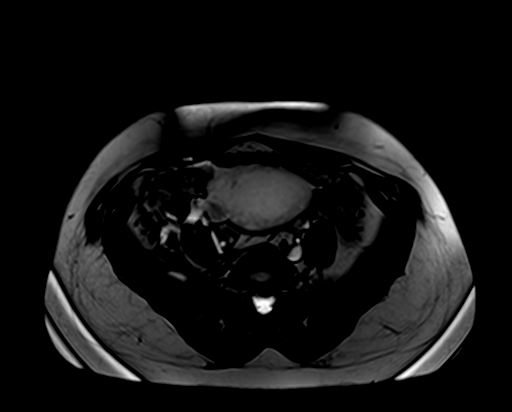
[im 21/41]
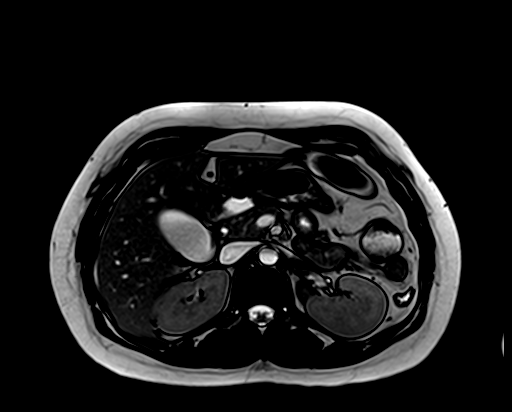
[im 41/41]
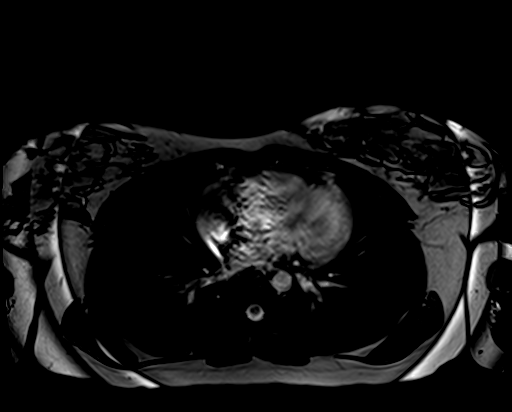

[Series 23: t1_vibe_fs_tra_p4_bh_pre · axial · 3.0mm · 1.19mm/px · z∈[-105,+180]mm · 7 of 96 slices shown]
[im 1/96]
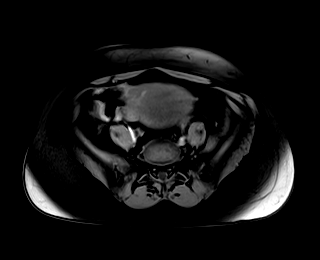
[im 16/96]
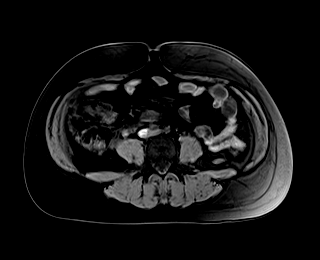
[im 32/96]
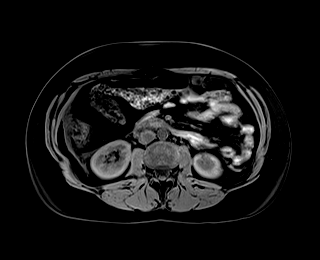
[im 48/96]
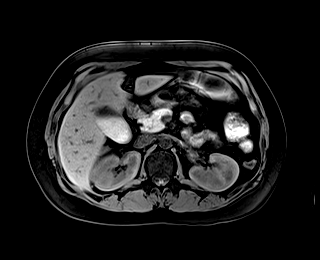
[im 64/96]
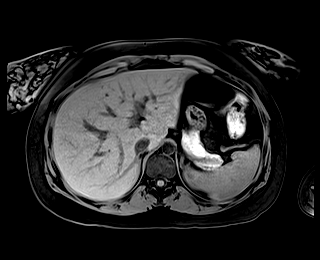
[im 80/96]
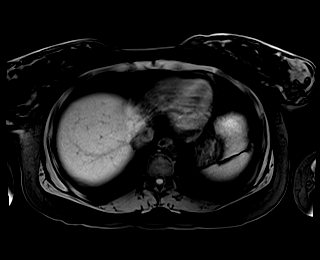
[im 96/96]
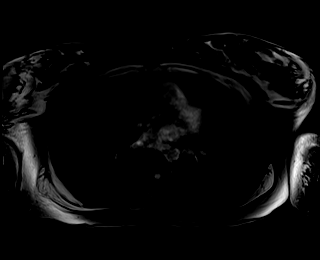

[Series 24: DWI · axial · 6.0mm · 1.42mm/px · z∈[-101,+151]mm · 7 of 123 slices shown]
[im 1/123]
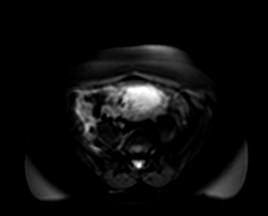
[im 16/123]
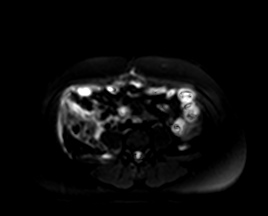
[im 31/123]
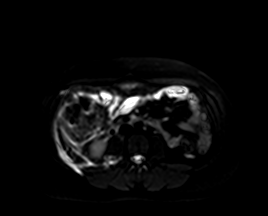
[im 46/123]
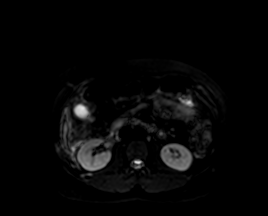
[im 77/123]
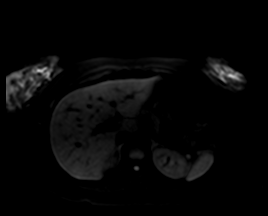
[im 92/123]
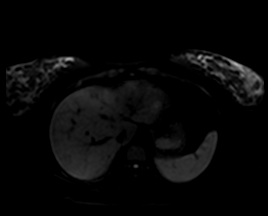
[im 107/123]
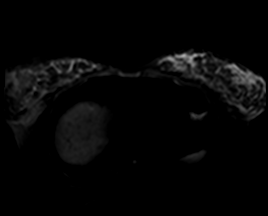

[43 of 48 positions shown; findings below may reference images not displayed]

FINDINGS: COMBINED FINDINGS FOR BOTH MR ABDOMEN AND PELVIS

Lower chest: No acute findings.

Hepatobiliary: No suspicious liver abnormality. No gallbladder wall
thickening or inflammation. Sludge noted layering within the
gallbladder. No bile duct dilatation.

Pancreas: No mass, inflammatory changes, or other parenchymal
abnormality identified.

Spleen:  Within normal limits in size and appearance.

Adrenals/Urinary Tract: Tiny angiomyolipoma identified within the
posterolateral cortex of the upper pole of left kidney measuring 5
mm, image [DATE]. No mass scratch set no suspicious mass or
hydronephrosis identified bilaterally. Bladder is unremarkable.

Stomach/Bowel: Stomach is normal. There are extensive inflammatory
changes within the right hemiabdomen with diffuse soft tissue
stranding and free fluid. The appendix is thickened and appears
inflamed. Multiple appendicoliths are noted, one of which appears
extraluminal, which raises the concern for perforated appendicitis,
image 32/15 and image [DATE]. Possible early fluid collection
surrounds the suspected extraluminal appendicolith measuring 3.2 x
0.7 by 2.8 cm (volume = 3 cm^3), image 14/14 and image 31/18.

Vascular/Lymphatic: No pathologically enlarged lymph nodes
identified. No abdominal aortic aneurysm demonstrated.

Reproductive: Gravid uterus containing intrauterine gestational sac
and embryo identified.

Other: Moderate free fluid within the right hemiabdomen which
extends over the right hepatic lobe and into the dependent portion
of pelvis.

Musculoskeletal: No suspicious bone lesions identified.
IMPRESSION: 1. Acute appendicitis.
2. Concern for appendiceal perforation with multiple appendicoliths,
1 of these appears outside the lumen of the appendix. Here, there is
possible early loculated fluid collection measuring approximately 3
cc.
3. Gravid uterus containing intrauterine gestational sac and embryo
identified.

## 2021-05-24 SURGERY — APPENDECTOMY, LAPAROSCOPIC
Anesthesia: General | Site: Abdomen

## 2021-05-24 MED ORDER — METRONIDAZOLE 500 MG/100ML IV SOLN
500.0000 mg | Freq: Two times a day (BID) | INTRAVENOUS | Status: DC
  Administered 2021-05-24 – 2021-06-01 (×17): 500 mg via INTRAVENOUS
  Filled 2021-05-24 (×19): qty 100

## 2021-05-24 MED ORDER — POTASSIUM CHLORIDE CRYS ER 20 MEQ PO TBCR
40.0000 meq | EXTENDED_RELEASE_TABLET | Freq: Two times a day (BID) | ORAL | Status: DC
  Administered 2021-05-24 – 2021-05-25 (×4): 40 meq via ORAL
  Filled 2021-05-24 (×4): qty 2

## 2021-05-24 MED ORDER — ROCURONIUM BROMIDE 10 MG/ML (PF) SYRINGE
PREFILLED_SYRINGE | INTRAVENOUS | Status: DC | PRN
  Administered 2021-05-24: 30 mg via INTRAVENOUS

## 2021-05-24 MED ORDER — ONDANSETRON HCL 4 MG/2ML IJ SOLN
4.0000 mg | Freq: Four times a day (QID) | INTRAMUSCULAR | Status: DC | PRN
  Administered 2021-05-24 – 2021-06-01 (×5): 4 mg via INTRAVENOUS
  Filled 2021-05-24 (×5): qty 2

## 2021-05-24 MED ORDER — ENOXAPARIN SODIUM 40 MG/0.4ML IJ SOSY: 40.0000 mg | PREFILLED_SYRINGE | INTRAMUSCULAR | Status: DC

## 2021-05-24 MED ORDER — PROPOFOL 10 MG/ML IV BOLUS
INTRAVENOUS | Status: DC | PRN
  Administered 2021-05-24: 150 mg via INTRAVENOUS
  Administered 2021-05-24: 20 mg via INTRAVENOUS

## 2021-05-24 MED ORDER — PROPOFOL 10 MG/ML IV BOLUS
INTRAVENOUS | Status: AC
  Filled 2021-05-24: qty 20

## 2021-05-24 MED ORDER — NEOSTIGMINE METHYLSULFATE 3 MG/3ML IV SOSY
PREFILLED_SYRINGE | INTRAVENOUS | Status: DC | PRN
Start: 2021-05-24 — End: 2021-05-24
  Administered 2021-05-24: 3 mg via INTRAVENOUS

## 2021-05-24 MED ORDER — NEOSTIGMINE METHYLSULFATE 3 MG/3ML IV SOSY
PREFILLED_SYRINGE | INTRAVENOUS | Status: AC
  Filled 2021-05-24: qty 3

## 2021-05-24 MED ORDER — SODIUM CHLORIDE 0.9 % IV SOLN: INTRAVENOUS | Status: DC

## 2021-05-24 MED ORDER — SODIUM CHLORIDE 0.9 % IV SOLN
2.0000 g | INTRAVENOUS | Status: DC
  Administered 2021-05-24 – 2021-06-01 (×9): 2 g via INTRAVENOUS
  Filled 2021-05-24 (×13): qty 20

## 2021-05-24 MED ORDER — DEXAMETHASONE SODIUM PHOSPHATE 10 MG/ML IJ SOLN
INTRAMUSCULAR | Status: DC | PRN
Start: 2021-05-24 — End: 2021-05-24
  Administered 2021-05-24: 10 mg via INTRAVENOUS

## 2021-05-24 MED ORDER — ONDANSETRON HCL 4 MG/2ML IJ SOLN
INTRAMUSCULAR | Status: AC
  Filled 2021-05-24: qty 2

## 2021-05-24 MED ORDER — SORBITOL 70 % SOLN
960.0000 mL | TOPICAL_OIL | Freq: Once | ORAL | Status: AC
  Administered 2021-05-24: 960 mL via RECTAL
  Filled 2021-05-24: qty 473

## 2021-05-24 MED ORDER — SUCCINYLCHOLINE CHLORIDE 200 MG/10ML IV SOSY
PREFILLED_SYRINGE | INTRAVENOUS | Status: DC | PRN
  Administered 2021-05-24: 120 mg via INTRAVENOUS

## 2021-05-24 MED ORDER — DEXAMETHASONE SODIUM PHOSPHATE 10 MG/ML IJ SOLN
INTRAMUSCULAR | Status: AC
  Filled 2021-05-24: qty 1

## 2021-05-24 MED ORDER — CHLORHEXIDINE GLUCONATE 0.12 % MT SOLN
OROMUCOSAL | Status: AC
  Filled 2021-05-24: qty 15

## 2021-05-24 MED ORDER — ONDANSETRON HCL 4 MG/2ML IJ SOLN
INTRAMUSCULAR | Status: DC | PRN
  Administered 2021-05-24: 4 mg via INTRAVENOUS

## 2021-05-24 MED ORDER — OXYCODONE HCL 5 MG PO TABS
5.0000 mg | ORAL_TABLET | ORAL | Status: DC | PRN
  Administered 2021-05-24: 5 mg via ORAL
  Administered 2021-05-24 – 2021-05-27 (×9): 10 mg via ORAL
  Administered 2021-05-28: 5 mg via ORAL
  Administered 2021-05-28 – 2021-06-01 (×6): 10 mg via ORAL
  Filled 2021-05-24 (×4): qty 2
  Filled 2021-05-24: qty 1
  Filled 2021-05-24: qty 2
  Filled 2021-05-24: qty 1
  Filled 2021-05-24 (×2): qty 2
  Filled 2021-05-24: qty 1
  Filled 2021-05-24 (×9): qty 2
  Filled 2021-05-24 (×2): qty 1

## 2021-05-24 MED ORDER — MIDAZOLAM HCL 2 MG/2ML IJ SOLN
INTRAMUSCULAR | Status: AC
  Filled 2021-05-24: qty 2

## 2021-05-24 MED ORDER — GLYCOPYRROLATE PF 0.2 MG/ML IJ SOSY
PREFILLED_SYRINGE | INTRAMUSCULAR | Status: DC | PRN
  Administered 2021-05-24: .4 mg via INTRAVENOUS

## 2021-05-24 MED ORDER — CHLORHEXIDINE GLUCONATE 0.12 % MT SOLN: 15.0000 mL | Freq: Once | OROMUCOSAL | Status: AC

## 2021-05-24 MED ORDER — ONDANSETRON HCL 4 MG/2ML IJ SOLN: 4.0000 mg | Freq: Once | INTRAMUSCULAR | Status: DC | PRN

## 2021-05-24 MED ORDER — ONDANSETRON 4 MG PO TBDP
4.0000 mg | ORAL_TABLET | Freq: Four times a day (QID) | ORAL | Status: DC | PRN
  Administered 2021-05-25 – 2021-05-30 (×5): 4 mg via ORAL
  Filled 2021-05-24 (×5): qty 1

## 2021-05-24 MED ORDER — ENOXAPARIN SODIUM 40 MG/0.4ML IJ SOSY
40.0000 mg | PREFILLED_SYRINGE | INTRAMUSCULAR | Status: DC
Start: 2021-05-25 — End: 2021-06-01
  Administered 2021-05-25 – 2021-05-30 (×6): 40 mg via SUBCUTANEOUS
  Filled 2021-05-24 (×7): qty 0.4

## 2021-05-24 MED ORDER — SODIUM CHLORIDE 0.9 % IR SOLN
Status: DC | PRN
Start: 2021-05-24 — End: 2021-05-24
  Administered 2021-05-24 (×3): 1000 mL

## 2021-05-24 MED ORDER — SUCCINYLCHOLINE CHLORIDE 200 MG/10ML IV SOSY
PREFILLED_SYRINGE | INTRAVENOUS | Status: AC
  Filled 2021-05-24: qty 10

## 2021-05-24 MED ORDER — 0.9 % SODIUM CHLORIDE (POUR BTL) OPTIME
TOPICAL | Status: DC | PRN
  Administered 2021-05-24: 1000 mL

## 2021-05-24 MED ORDER — PHENYLEPHRINE 80 MCG/ML (10ML) SYRINGE FOR IV PUSH (FOR BLOOD PRESSURE SUPPORT)
PREFILLED_SYRINGE | INTRAVENOUS | Status: DC | PRN
  Administered 2021-05-24 (×2): 80 ug via INTRAVENOUS

## 2021-05-24 MED ORDER — ROCURONIUM BROMIDE 10 MG/ML (PF) SYRINGE
PREFILLED_SYRINGE | INTRAVENOUS | Status: AC
  Filled 2021-05-24: qty 10

## 2021-05-24 MED ORDER — FENTANYL CITRATE (PF) 250 MCG/5ML IJ SOLN
INTRAMUSCULAR | Status: AC
  Filled 2021-05-24: qty 5

## 2021-05-24 MED ORDER — BUPIVACAINE-EPINEPHRINE (PF) 0.25% -1:200000 IJ SOLN
INTRAMUSCULAR | Status: AC
  Filled 2021-05-24: qty 30

## 2021-05-24 MED ORDER — PHENYLEPHRINE 80 MCG/ML (10ML) SYRINGE FOR IV PUSH (FOR BLOOD PRESSURE SUPPORT)
PREFILLED_SYRINGE | INTRAVENOUS | Status: AC
  Filled 2021-05-24: qty 10

## 2021-05-24 MED ORDER — LACTATED RINGERS IV SOLN: INTRAVENOUS | Status: DC

## 2021-05-24 MED ORDER — OXYCODONE-ACETAMINOPHEN 5-325 MG PO TABS
2.0000 | ORAL_TABLET | Freq: Once | ORAL | Status: AC
  Administered 2021-05-24: 2 via ORAL
  Filled 2021-05-24: qty 2

## 2021-05-24 MED ORDER — GLYCOPYRROLATE PF 0.2 MG/ML IJ SOSY
PREFILLED_SYRINGE | INTRAMUSCULAR | Status: AC
  Filled 2021-05-24: qty 2

## 2021-05-24 MED ORDER — SORBITOL 70 % SOLN
960.0000 mL | TOPICAL_OIL | Freq: Once | ORAL | Status: DC
  Filled 2021-05-24: qty 473

## 2021-05-24 MED ORDER — LIDOCAINE 2% (20 MG/ML) 5 ML SYRINGE
INTRAMUSCULAR | Status: DC | PRN
  Administered 2021-05-24: 80 mg via INTRAVENOUS

## 2021-05-24 MED ORDER — CHLORHEXIDINE GLUCONATE 0.12 % MT SOLN
OROMUCOSAL | Status: AC
  Administered 2021-05-24: 15 mL via OROMUCOSAL
  Filled 2021-05-24: qty 15

## 2021-05-24 MED ORDER — ACETAMINOPHEN 325 MG PO TABS
650.0000 mg | ORAL_TABLET | Freq: Four times a day (QID) | ORAL | Status: DC | PRN
  Administered 2021-05-27 – 2021-05-30 (×7): 650 mg via ORAL
  Filled 2021-05-24 (×8): qty 2

## 2021-05-24 MED ORDER — FENTANYL CITRATE (PF) 100 MCG/2ML IJ SOLN: 25.0000 ug | INTRAMUSCULAR | Status: DC | PRN

## 2021-05-24 MED ORDER — ACETAMINOPHEN 650 MG RE SUPP
650.0000 mg | Freq: Four times a day (QID) | RECTAL | Status: DC | PRN
  Filled 2021-05-24 (×2): qty 1

## 2021-05-24 MED ORDER — BUPIVACAINE-EPINEPHRINE (PF) 0.25% -1:200000 IJ SOLN
INTRAMUSCULAR | Status: DC | PRN
  Administered 2021-05-24: 12 mL

## 2021-05-24 MED ORDER — ORAL CARE MOUTH RINSE: 15.0000 mL | Freq: Once | OROMUCOSAL | Status: AC

## 2021-05-24 MED ORDER — FENTANYL CITRATE (PF) 250 MCG/5ML IJ SOLN
INTRAMUSCULAR | Status: DC | PRN
Start: 2021-05-24 — End: 2021-05-24
  Administered 2021-05-24: 50 ug via INTRAVENOUS
  Administered 2021-05-24 (×2): 100 ug via INTRAVENOUS
  Administered 2021-05-24: 50 ug via INTRAVENOUS
  Administered 2021-05-24: 100 ug via INTRAVENOUS

## 2021-05-24 SURGICAL SUPPLY — 44 items
APPLIER CLIP ROT 10 11.4 M/L (STAPLE)
BAG COUNTER SPONGE SURGICOUNT (BAG) ×2 IMPLANT
BLADE CLIPPER SURG (BLADE) IMPLANT
CANISTER SUCT 3000ML PPV (MISCELLANEOUS) ×2 IMPLANT
CHLORAPREP W/TINT 26 (MISCELLANEOUS) ×2 IMPLANT
CLIP APPLIE ROT 10 11.4 M/L (STAPLE) IMPLANT
COVER SURGICAL LIGHT HANDLE (MISCELLANEOUS) ×2 IMPLANT
CUTTER FLEX LINEAR 45M (STAPLE) ×2 IMPLANT
DERMABOND ADVANCED (GAUZE/BANDAGES/DRESSINGS) ×1
DERMABOND ADVANCED .7 DNX12 (GAUZE/BANDAGES/DRESSINGS) ×1 IMPLANT
ELECT REM PT RETURN 9FT ADLT (ELECTROSURGICAL) ×2
ELECTRODE REM PT RTRN 9FT ADLT (ELECTROSURGICAL) ×1 IMPLANT
ENDOLOOP SUT PDS II  0 18 (SUTURE)
ENDOLOOP SUT PDS II 0 18 (SUTURE) IMPLANT
GLOVE BIO SURGEON STRL SZ8 (GLOVE) ×2 IMPLANT
GLOVE BIOGEL PI IND STRL 8 (GLOVE) ×1 IMPLANT
GLOVE BIOGEL PI INDICATOR 8 (GLOVE) ×1
GOWN STRL REUS W/ TWL LRG LVL3 (GOWN DISPOSABLE) ×2 IMPLANT
GOWN STRL REUS W/ TWL XL LVL3 (GOWN DISPOSABLE) ×1 IMPLANT
GOWN STRL REUS W/TWL LRG LVL3 (GOWN DISPOSABLE) ×4
GOWN STRL REUS W/TWL XL LVL3 (GOWN DISPOSABLE) ×2
KIT BASIN OR (CUSTOM PROCEDURE TRAY) ×2 IMPLANT
KIT TURNOVER KIT B (KITS) ×2 IMPLANT
NS IRRIG 1000ML POUR BTL (IV SOLUTION) ×2 IMPLANT
PAD ARMBOARD 7.5X6 YLW CONV (MISCELLANEOUS) ×4 IMPLANT
POUCH RETRIEVAL ECOSAC 10 (ENDOMECHANICALS) ×1 IMPLANT
POUCH RETRIEVAL ECOSAC 10MM (ENDOMECHANICALS) ×2
RELOAD STAPLE 45 3.5 BLU ETS (ENDOMECHANICALS) ×1 IMPLANT
RELOAD STAPLE TA45 3.5 REG BLU (ENDOMECHANICALS) ×2 IMPLANT
SCISSORS LAP 5X35 DISP (ENDOMECHANICALS) ×1 IMPLANT
SET IRRIG TUBING LAPAROSCOPIC (IRRIGATION / IRRIGATOR) ×2 IMPLANT
SET TUBE SMOKE EVAC HIGH FLOW (TUBING) ×2 IMPLANT
SHEARS HARMONIC ACE PLUS 36CM (ENDOMECHANICALS) ×2 IMPLANT
SPECIMEN JAR SMALL (MISCELLANEOUS) ×2 IMPLANT
SUT MON AB 4-0 PC3 18 (SUTURE) ×2 IMPLANT
TOWEL GREEN STERILE (TOWEL DISPOSABLE) ×2 IMPLANT
TOWEL GREEN STERILE FF (TOWEL DISPOSABLE) ×2 IMPLANT
TRAY FOLEY W/BAG SLVR 16FR (SET/KITS/TRAYS/PACK) ×2
TRAY FOLEY W/BAG SLVR 16FR ST (SET/KITS/TRAYS/PACK) ×1 IMPLANT
TRAY LAPAROSCOPIC MC (CUSTOM PROCEDURE TRAY) ×2 IMPLANT
TROCAR XCEL BLADELESS 5X75MML (TROCAR) ×4 IMPLANT
TROCAR XCEL BLUNT TIP 100MML (ENDOMECHANICALS) ×2 IMPLANT
WARMER LAPAROSCOPE (MISCELLANEOUS) ×2 IMPLANT
WATER STERILE IRR 1000ML POUR (IV SOLUTION) ×2 IMPLANT

## 2021-05-24 NOTE — MAU Note (Signed)
CHG wipes completed.  Pt voided at 1000. ?

## 2021-05-24 NOTE — Anesthesia Procedure Notes (Signed)
Procedure Name: Intubation ?Date/Time: 05/24/2021 12:40 PM ?Performed by: Jenne Campus, CRNA ?Pre-anesthesia Checklist: Patient identified, Emergency Drugs available, Suction available and Patient being monitored ?Patient Re-evaluated:Patient Re-evaluated prior to induction ?Oxygen Delivery Method: Circle System Utilized ?Preoxygenation: Pre-oxygenation with 100% oxygen ?Induction Type: IV induction, Rapid sequence and Cricoid Pressure applied ?Ventilation: Mask ventilation without difficulty ?Laryngoscope Size: Sabra Heck and 2 ?Grade View: Grade I ?Tube type: Oral ?Tube size: 7.0 mm ?Number of attempts: 1 ?Airway Equipment and Method: Stylet and Oral airway ?Placement Confirmation: ETT inserted through vocal cords under direct vision, positive ETCO2 and breath sounds checked- equal and bilateral ?Secured at: 22 cm ?Tube secured with: Tape ?Dental Injury: Teeth and Oropharynx as per pre-operative assessment  ? ? ? ? ?

## 2021-05-24 NOTE — Transfer of Care (Signed)
Immediate Anesthesia Transfer of Care Note ? ?Patient: Tina Gibbs ? ?Procedure(s) Performed: APPENDECTOMY LAPAROSCOPIC (Abdomen) ? ?Patient Location: PACU ? ?Anesthesia Type:General ? ?Level of Consciousness: patient cooperative and responds to stimulation ? ?Airway & Oxygen Therapy: Patient Spontanous Breathing ? ?Post-op Assessment: Report given to RN, Post -op Vital signs reviewed and stable and Patient moving all extremities X 4 ? ?Post vital signs: Reviewed and stable ? ?Last Vitals:  ?Vitals Value Taken Time  ?BP 133/78 05/24/21 1353  ?Temp    ?Pulse 107 05/24/21 1359  ?Resp 21 05/24/21 1359  ?SpO2 96 % 05/24/21 1359  ?Vitals shown include unvalidated device data. ? ?Last Pain:  ?Vitals:  ? 05/24/21 1150  ?TempSrc:   ?PainSc: 8   ?   ? ?Patients Stated Pain Goal: 3 (05/24/21 1150) ? ?Complications: No notable events documented. ?

## 2021-05-24 NOTE — Progress Notes (Signed)
Patient ID: Tina Gibbs, female   DOB: 2000/01/21, 21 y.o.   MRN: [id number] ? ?Pt informed that the ultrasound is considered a limited OB ultrasound and is not intended to be a complete ultrasound exam.  Patient also informed that the ultrasound is not being completed with the intent of assessing for fetal or placental anomalies or any pelvic abnormalities.  Explained that the purpose of today?s ultrasound is to assess for  viability after surgery after nurse unable to doppler.  Patient acknowledges the purpose of the exam and the limitations of the study.   ? ?IUP with CRL [redacted]w[redacted]d FHR 140s. Good fetal movement. ? ?JTruett Mainland DO ?05/24/2021 ?2:39 PM ? ?

## 2021-05-24 NOTE — Discharge Instructions (Signed)
[facility] ? ?Please arrive at least 30 min before your appointment to complete your check in paperwork.  If you are unable to arrive 30 min prior to your appointment time we may have to cancel or reschedule you. ?LAPAROSCOPIC SURGERY: POST OP INSTRUCTIONS ?Always review your discharge instruction sheet given to you by the facility where your surgery was performed. ?IF YOU HAVE DISABILITY OR FAMILY LEAVE FORMS, YOU MUST BRING THEM TO THE OFFICE FOR PROCESSING.   ?DO NOT GIVE THEM TO YOUR DOCTOR. ? ?PAIN CONTROL ? ?First take acetaminophen (Tylenol).  Follow directions on package.  Taking acetaminophen (Tylenol) regularly after surgery will help to control your pain and lower the amount of prescription pain medication you may need.  You should not take more than 3,000 mg (3 grams) of acetaminophen (Tylenol) in 24 hours.  You should not take ibuprofen (Advil), aleve, motrin, naprosyn or other NSAIDS during pregnancy unless told otherwise by your OB. ?A prescription for pain medication may be given to you upon discharge.  Take your pain medication as prescribed, if you still have uncontrolled pain after taking acetaminophen (Tylenol). ?Use ice packs to help control pain. ?If you need a refill on your pain medication, please contact your pharmacy.  They will contact our office to request authorization. Prescriptions will not be filled after 5pm or on week-ends. ? ?HOME MEDICATIONS ?Take your usually prescribed medications unless otherwise directed. ? ?DIET ?You should follow a light diet the first few days after arrival home.  Be sure to include lots of fluids daily. Avoid fatty, fried foods.  ? ?CONSTIPATION ?It is common to experience some constipation after surgery and if you are taking pain medication.  Increasing fluid intake and taking a stool softener (such as Colace) will usually help or prevent this problem from occurring.  A mild laxative (Milk of Magnesia or Miralax) should be taken  according to package instructions if there are no bowel movements after 48 hours. ? ?WOUND/INCISION CARE ?Most patients will experience some swelling and bruising in the area of the incisions.  Ice packs will help.  Swelling and bruising can take several days to resolve.  ?Unless discharge instructions indicate otherwise, follow guidelines below  ?STERI-STRIPS - you may remove your outer bandages 48 hours after surgery, and you may shower at that time.  You have steri-strips (small skin tapes) in place directly over the incision.  These strips should be left on the skin for 7-10 days.   ?DERMABOND/SKIN GLUE - you may shower in 24 hours.  The glue will flake off over the next 2-3 weeks. ?Any sutures or staples will be removed at the office during your follow-up visit. ? ?ACTIVITIES ?You may resume regular (light) daily activities beginning the next day--such as daily self-care, walking, climbing stairs--gradually increasing activities as tolerated.  You may have sexual intercourse when it is comfortable.  Refrain from any heavy lifting or straining until approved by your doctor. ?You may drive when you are no longer taking prescription pain medication, you can comfortably wear a seatbelt, and you can safely maneuver your car and apply brakes. ? ?FOLLOW-UP ?You should see your doctor in the office for a follow-up appointment approximately 2-3 weeks after your surgery.  You should have been given your post-op/follow-up appointment when your surgery was scheduled.  If you did not receive a post-op/follow-up appointment, make sure that you call for this appointment within a day or two after you arrive home to insure a convenient appointment  time. ? ? ?WHEN TO CALL YOUR DOCTOR: ?Fever over 101.0 ?Inability to urinate ?Continued bleeding from incision. ?Increased pain, redness, or drainage from the incision. ?Increasing abdominal pain ? ?The clinic staff is available to answer your questions during regular business hours.   Please don?t hesitate to call and ask to speak to one of the nurses for clinical concerns.  If you have a medical emergency, go to the nearest emergency room or call 911.  A surgeon from [facility] is always on call at the hospital. ?[address] [place], [place], [place]  [address] ? [address], [place], [place]   [address] ?[phone] ? FAX [phone] ? ? ?
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
## 2021-05-24 NOTE — Anesthesia Postprocedure Evaluation (Signed)
Anesthesia Post Note ? ?Patient: Tina Gibbs ? ?Procedure(s) Performed: APPENDECTOMY LAPAROSCOPIC (Abdomen) ? ?  ? ?Patient location during evaluation: PACU ?Anesthesia Type: General ?Level of consciousness: awake and alert and oriented ?Pain management: pain level controlled ?Vital Signs Assessment: post-procedure vital signs reviewed and stable ?Respiratory status: spontaneous breathing, nonlabored ventilation and respiratory function stable ?Cardiovascular status: blood pressure returned to baseline and stable ?Postop Assessment: no apparent nausea or vomiting ?Anesthetic complications: no ? ? ?No notable events documented. ? ?Last Vitals:  ?Vitals:  ? 05/24/21 1410 05/24/21 1425  ?BP: 126/69 121/62  ?Pulse: 96 84  ?Resp: (!) 21 20  ?Temp:    ?SpO2: 97% 97%  ?  ?Last Pain:  ?Vitals:  ? 05/24/21 1425  ?TempSrc:   ?PainSc: 0-No pain  ? ? ?  ?  ?  ?  ?  ?  ? ?Aydee Mcnew A. ? ? ? ? ?

## 2021-05-24 NOTE — MAU Note (Signed)
.  Tina Gibbs is a 21 y.o. at 74w2dhere in MAU reporting: RLQ pain off and on for 3 days. Pain is worse tonight and constant. Denies any vag bleeding or discharge. Stated she has had chills as well. Taking tylenol without any relief. Was here a 3 days ago and treated for nausea and vomiting. Stated she has not thrown up since then.  ? ?Onset of complaint: 3 days.  ?Pain score: 10 ?Vitals:  ? 05/24/21 0019  ?BP: 109/66  ?Pulse: (!) 117  ?Resp: 18  ?Temp: 98.4 ?F (36.9 ?C)  ?   ?FHT: ?Lab orders placed from triage:  u/a ?

## 2021-05-24 NOTE — Anesthesia Preprocedure Evaluation (Addendum)
Anesthesia Evaluation  ?Patient identified by MRN, date of birth, ID band ? ?Reviewed: ?Allergy & Precautions, NPO status , Patient's Chart, lab work & pertinent test results ? ?Airway ?Mallampati: I ? ?TM Distance: >3 FB ?Neck ROM: Full ? ? ? Dental ?no notable dental hx. ?(+) Dental Advisory Given,  ?  ?Pulmonary ?asthma ,  ?  ?Pulmonary exam normal ?breath sounds clear to auscultation ? ? ? ? ? ? Cardiovascular ?Exercise Tolerance: Good ?negative cardio ROS ?Normal cardiovascular exam ?Rhythm:Regular Rate:Normal ? ? ?  ?Neuro/Psych ?PSYCHIATRIC DISORDERS Anxiety Depression   ? GI/Hepatic ?Neg liver ROS, GERD  Medicated,Appendicitis ?  ?Endo/Other  ?negative endocrine ROS ? Renal/GU ?negative Renal ROS  ? ?  ?Musculoskeletal ?negative musculoskeletal ROS ?(+)  ? Abdominal ?  ?Peds ? ?(+) ADHD Hematology ? ?(+) Blood dyscrasia, anemia ,   ?Anesthesia Other Findings ? ? Reproductive/Obstetrics ?(+) Pregnancy (12 weeks) ? ?  ? ? ? ? ? ? ? ? ? ? ? ? ? ?  ?  ? ? ? ? ? ? ?Anesthesia Physical ?Anesthesia Plan ? ?ASA: 2 and emergent ? ?Anesthesia Plan: General  ? ?Post-op Pain Management:   ? ?Induction: Intravenous ? ?PONV Risk Score and Plan: 3 and Dexamethasone, Ondansetron and Treatment may vary due to age or medical condition ? ?Airway Management Planned: Oral ETT ? ?Additional Equipment:  ? ?Intra-op Plan:  ? ?Post-operative Plan: Extubation in OR ? ?Informed Consent: I have reviewed the patients History and Physical, chart, labs and discussed the procedure including the risks, benefits and alternatives for the proposed anesthesia with the patient or authorized representative who has indicated his/her understanding and acceptance.  ? ? ? ?Dental advisory given ? ?Plan Discussed with: CRNA, Anesthesiologist and Surgeon ? ?Anesthesia Plan Comments: ([facility] in PACU per OB service)  ? ? ? ? ? ?Anesthesia Quick Evaluation ? ?
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
## 2021-05-24 NOTE — Op Note (Signed)
Appendectomy, Laparoscopic , Procedure Note ? ?Indications: The patient presented with a history of right-sided abdominal pain. A CT  revealed findings consistent with acute appendicitis.  Patient is [redacted] weeks pregnant.  She was seen 2 days ago and MRI revealed normal-appearing appendix.  Her pain persisted and she followed up and was found to have perforated appendicitis with appendicoliths noted on MRI.  Surgery was consulted.  I discussed the options of operative and nonoperative management and circumstance.  I discussed potential fetal loss of being well over 30% with nonoperative management of this situation.  I discussed potential risk of fetal loss even with operative intervention.  Discussed potential bowel resection, spontaneous abortion, and abscess formation.  After discussion the pros and cons of surgery she opted to proceed with surgical intervention for perforated appendicitis.The procedure has been discussed with the patient.  Alternative therapies have been discussed with the patient.  Operative risks include bleeding,  Infection,  Organ injury,  Nerve injury,  Blood vessel injury,  DVT,  Pulmonary embolism,  Death,  And possible reoperation.  Medical management risks include worsening of present situation.  The success of the procedure is 50 -90 % at treating patients symptoms.  The patient understands and agrees to proceed.  ? ?Pre-operative Diagnosis: Acute appendicitis with generalized peritonitis ? ?Post-operative Diagnosis: Same ? ?Surgeon: Turner Daniels MD ? ?Assistants: NONE ? ?Anesthesia: General endotracheal anesthesia and Local anesthesia 0.25.% bupivacaine ? ?ASA Class: 1 ? ?Procedure Details  ?The patient was seen again in the Holding Room. The risks, benefits, complications, treatment options, and expected outcomes were discussed with the patient and/or family. The possibilities of reaction to medication, pulmonary aspiration, perforation of viscus, bleeding, recurrent infection,  finding a normal appendix, the need for additional procedures, failure to diagnose a condition, and creating a complication requiring transfusion or operation were discussed. There was concurrence with the proposed plan and informed consent was obtained. The site of surgery was properly noted/marked. The patient was taken to Operating Room, identified as Tina Gibbs and the procedure verified as Appendectomy. A Time Out was held and the above information confirmed. ? ?The patient was placed in the supine position and general anesthesia was induced, along with placement of orogastric tube, Venodyne boots, and a Foley catheter. The abdomen was prepped and draped in a sterile fashion. A one centimeter infraumbilical incision was made and the peritoneal cavity was accessed using the OPEN  technique. The pneumoperitoneum was then established to steady pressure of 12 mmHg. A 12 mm port was placed through the umbilical incision. Additional 5 mm cannulas then placed in the left lower quadrant of the abdomen and right upper quadrant.  A careful evaluation of the entire abdomen was carried out. The patient was placed in Trendelenburg and left lateral decubitus position.  The uterus was noted at expected gestational size. No evidence of injury to uterus  or internal organs. The small intestines were retracted in the cephalad and left lateral direction away from the pelvis and right lower quadrant. The patient was found to have an enlarged and inflamed appendix that was extending into the pelvis. There was no evidence of perforation. ? ?The appendix was carefully dissected. A window was made in the mesoappendix at the base of the appendix. A harmonic scalpel was used across the mesoappendix. The appendix was divided at its base using an endo-GIA stapler. Minimal appendiceal stump was left in place. There was no evidence of bleeding, leakage, or complication after division of the appendix.  Irrigation was also performed and  irrigate suctioned from the abdomen as well. ? ?The umbilical port site was closed using 0 vicryl pursestring sutures fashion at the level of the fascia. The trocar site skin wounds were closed  with 4 - O monocryl. ? ?Instrument, sponge, and needle counts were correct at the conclusion of the case.  ? ?Findings: ?The appendix was found to be inflamed. There were signs of necrosis.  There was perforation. There was not abscess formation. ? ?Estimated Blood Loss:  Minimal ?        ?Drains: NONE ?        ?Total IV Fluids: PER record  ?        ?Specimens: appendix   ?        ?Complications:  None; patient tolerated the procedure well. ?        ?Disposition: PACU - hemodynamically stable. ?        ?Condition: stable ?  ?

## 2021-05-24 NOTE — MAU Note (Signed)
Gave Pt 2 SSE with minimal relief. SMOGG enema given 2 instillations with minimal results. Pt still c/o pain and discomfort. Provider notified. Pain medication ordered and MRI. ?

## 2021-05-24 NOTE — Interval H&P Note (Signed)
History and Physical Interval Note: ? ?05/24/2021 ?12:04 PM ? ?Tina Gibbs  has presented today for surgery, with the diagnosis of Appendicitis.  The various methods of treatment have been discussed with the patient and family. After consideration of risks, benefits and other options for treatment, the patient has consented to  Procedure(s): ?APPENDECTOMY LAPAROSCOPIC (N/A) as a surgical intervention.  The patient's history has been reviewed, patient examined, no change in status, stable for surgery.  I have reviewed the patient's chart and labs.  Questions were answered to the patient's satisfaction.   ? ?Discussed the case with the patient and mother at bedside.  Discussed potential risks of nonoperative management of being up to 35% chance of spontaneous abortion and the need for surgery down the road given that she does have appendicoliths on CT scan which indicates a higher failure rate of medical management.  Also discussed laparotomy depending on findings and complications of intra-abdominal abscess and loss of fetus up to 10% with operative management depending on Intra-Op operative findings.  Discussed with the risk of bleeding, infection, organ injury, loss of fetus/abortion of fetus spontaneously, potential fertility issues down the road within the scar tissue formation and the need further treatments and/or procedures.  Also discussed potential bowel injury, and need for bowel resection for colon resection.  They voiced understanding of the procedure was to proceed with laparoscopic appendectomy. ?Patric Vanpelt A Talon Regala ? ? ?

## 2021-05-25 ENCOUNTER — Encounter (HOSPITAL_COMMUNITY): Payer: Self-pay | Admitting: Surgery

## 2021-05-25 DIAGNOSIS — K3532 Acute appendicitis with perforation and localized peritonitis, without abscess: Secondary | ICD-10-CM | POA: Diagnosis not present

## 2021-05-25 DIAGNOSIS — Z3A12 12 weeks gestation of pregnancy: Secondary | ICD-10-CM | POA: Diagnosis not present

## 2021-05-25 LAB — CBC
HCT: 27.8 % — ABNORMAL LOW (ref 36.0–46.0)
Hemoglobin: 9.5 g/dL — ABNORMAL LOW (ref 12.0–15.0)
MCH: 32.3 pg (ref 26.0–34.0)
MCHC: 34.2 g/dL (ref 30.0–36.0)
MCV: 94.6 fL (ref 80.0–100.0)
Platelets: 216 10*3/uL (ref 150–400)
RBC: 2.94 MIL/uL — ABNORMAL LOW (ref 3.87–5.11)
RDW: 13.1 % (ref 11.5–15.5)
WBC: 17.2 10*3/uL — ABNORMAL HIGH (ref 4.0–10.5)
nRBC: 0 % (ref 0.0–0.2)

## 2021-05-25 LAB — BASIC METABOLIC PANEL
Anion gap: 8 (ref 5–15)
BUN: 5 mg/dL — ABNORMAL LOW (ref 6–20)
CO2: 20 mmol/L — ABNORMAL LOW (ref 22–32)
Calcium: 8.7 mg/dL — ABNORMAL LOW (ref 8.9–10.3)
Chloride: 108 mmol/L (ref 98–111)
Creatinine, Ser: 0.48 mg/dL (ref 0.44–1.00)
GFR, Estimated: >60 mL/min (ref >60)
Glucose, Bld: 106 mg/dL — ABNORMAL HIGH (ref 70–99)
Potassium: 3.7 mmol/L (ref 3.5–5.1)
Sodium: 136 mmol/L (ref 135–145)

## 2021-05-25 LAB — MAGNESIUM: Magnesium: 1.8 mg/dL (ref 1.7–2.4)

## 2021-05-25 LAB — SURGICAL PATHOLOGY

## 2021-05-25 MED ORDER — SODIUM CHLORIDE 0.9 % IV SOLN
INTRAVENOUS | Status: DC | PRN
  Administered 2021-05-25: 10 mL via INTRAVENOUS

## 2021-05-25 MED ORDER — FAMOTIDINE 20 MG PO TABS
20.0000 mg | ORAL_TABLET | Freq: Two times a day (BID) | ORAL | Status: DC
  Administered 2021-05-25 – 2021-06-01 (×15): 20 mg via ORAL
  Filled 2021-05-25 (×16): qty 1

## 2021-05-25 MED ORDER — DOCUSATE SODIUM 100 MG PO CAPS
100.0000 mg | ORAL_CAPSULE | Freq: Two times a day (BID) | ORAL | Status: DC
Start: 2021-05-25 — End: 2021-06-01
  Administered 2021-05-25 – 2021-06-01 (×14): 100 mg via ORAL
  Filled 2021-05-25 (×14): qty 1

## 2021-05-25 MED ORDER — PRENATAL MULTIVITAMIN CH
1.0000 | ORAL_TABLET | Freq: Every day | ORAL | Status: DC
  Administered 2021-05-25 – 2021-06-01 (×8): 1 via ORAL
  Filled 2021-05-25 (×8): qty 1

## 2021-05-25 MED ORDER — POLYETHYLENE GLYCOL 3350 17 G PO PACK
17.0000 g | PACK | Freq: Every day | ORAL | Status: DC | PRN
  Administered 2021-05-27 – 2021-05-29 (×3): 17 g via ORAL
  Filled 2021-05-25 (×3): qty 1

## 2021-05-25 NOTE — Progress Notes (Signed)
? ? ?1 Day Post-Op  ?Subjective: ?CC: ?Reports she feels "much better". Some soreness around incisions but otherwise no abdominal pain. Tolerated cld yesterday. About to try FLD for breakfast. No nausea or vomiting after eating yesterday. Had some nausea this am controlled with zofran but thinks it is more related to her normal morning sickness related to the pregnancy. She has not had any nausea related to po intake. No flatus or bm yet. Voiding without difficulty. Mobilizing well. OB checked baby this am - HR 145 by doppler. No vaginal bleeding.  ? ?Objective: ?Vital signs in last 24 hours: ?Temp:  [97.8 ?F (36.6 ?C)-98.5 ?F (36.9 ?C)] 98 ?F (36.7 ?C) (05/09 0315) ?Pulse Rate:  [84-122] 94 (05/09 0315) ?Resp:  [16-21] 18 (05/09 0315) ?BP: (105-133)/(60-78) 119/62 (05/09 0315) ?SpO2:  [97 %-100 %] 98 % (05/09 0315) ?Weight:  [62.6 kg] 62.6 kg (05/08 1130) ?  ? ?Intake/Output from previous day: ?05/08 0701 - 05/09 0700 ?In: 3150.8 [P.O.:1680; I.V.:1220.8; IV Piggyback:200] ?Out: 2250 [Urine:2250] ?Intake/Output this shift: ?No intake/output data recorded. ? ?PE: ?Gen:  Alert, NAD, pleasant ?Card:  RRR ?Pulm:  CTAB, no W/R/R, effort normal ?Abd: Soft, gravid abdomen that appears ND, appropriately tender around laparoscopic incisions - otherwise NT. +BS, laparoscopic incisions c/d/i ?Ext: SCDs in place. No edema of BLE's ?Psych: A&Ox3  ? ?Lab Results:  ?Recent Labs  ?  05/24/21 ?4098 05/25/21 ?0415  ?WBC 16.7* 17.2*  ?HGB 11.9* 9.5*  ?HCT 33.2* 27.8*  ?PLT 248 216  ? ?BMET ?Recent Labs  ?  05/24/21 ?1191 05/25/21 ?0415  ?NA 134* 136  ?K 2.8* 3.7  ?CL 101 108  ?CO2 22 20*  ?GLUCOSE 113* 106*  ?BUN 7 5*  ?CREATININE 0.76 0.48  ?CALCIUM 9.1 8.7*  ? ?PT/INR ?No results for input(s): LABPROT, INR in the last 72 hours. ?CMP  ?   ?Component Value Date/Time  ? NA 136 05/25/2021 0415  ? K 3.7 05/25/2021 0415  ? CL 108 05/25/2021 0415  ? CO2 20 (L) 05/25/2021 0415  ? GLUCOSE 106 (H) 05/25/2021 0415  ? BUN 5 (L) 05/25/2021 0415   ? CREATININE 0.48 05/25/2021 0415  ? CALCIUM 8.7 (L) 05/25/2021 0415  ? PROT 6.4 (L) 05/24/2021 0039  ? ALBUMIN 3.2 (L) 05/24/2021 0039  ? AST 13 (L) 05/24/2021 0039  ? ALT 12 05/24/2021 0039  ? ALKPHOS 45 05/24/2021 0039  ? BILITOT 0.9 05/24/2021 0039  ? GFRNONAA >60 05/25/2021 0415  ? GFRAA >60 02/07/2019 1830  ? ?Lipase  ?   ?Component Value Date/Time  ? LIPASE 23 05/24/2021 0039  ? ? ?Studies/Results: ?MR PELVIS WO CONTRAST ? ?Result Date: 05/24/2021 ?CLINICAL DATA:  Right lower quadrant abdominal pain in pregnancy. EXAM: MRI ABDOMEN AND PELVIS WITHOUT CONTRAST TECHNIQUE: Multiplanar multisequence MR imaging of the abdomen and pelvis was performed. No intravenous contrast was administered. COMPARISON:  05/22/2021. FINDINGS: COMBINED FINDINGS FOR BOTH MR ABDOMEN AND PELVIS Lower chest: No acute findings. Hepatobiliary: No suspicious liver abnormality. No gallbladder wall thickening or inflammation. Sludge noted layering within the gallbladder. No bile duct dilatation. Pancreas: No mass, inflammatory changes, or other parenchymal abnormality identified. Spleen:  Within normal limits in size and appearance. 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 No mass scratch set no suspicious mass or hydronephrosis identified bilaterally. Bladder is unremarkable. Stomach/Bowel: Stomach is normal. There are extensive inflammatory changes within the right hemiabdomen with diffuse soft tissue stranding and  free fluid. The appendix is thickened and appears inflamed. Multiple appendicoliths are noted, one of which appears extraluminal, which raises the concern for perforated appendicitis, image 32/15 and image 15/6. Possible early fluid collection surrounds the suspected extraluminal appendicolith measuring 3.2 x 0.7 by 2.8 cm (volume = 3 cm^3), image 14/14 and image 31/18. Vascular/Lymphatic: No pathologically enlarged lymph  nodes identified. No abdominal aortic aneurysm demonstrated. Reproductive: Gravid uterus containing intrauterine gestational sac and embryo identified. Other: Moderate free fluid within the right hemiabdomen which extends over the right hepatic lobe and into the dependent portion of pelvis. Musculoskeletal: No suspicious bone lesions identified. IMPRESSION: 1. Acute appendicitis. 2. Concern for appendiceal perforation with multiple appendicoliths, 1 of these appears outside the lumen of the appendix. Here, there is possible early loculated fluid collection measuring approximately 3 cc. 3. Gravid uterus containing intrauterine gestational sac and embryo identified. Electronically Signed   By: Kerby Moors M.D.   On: 05/24/2021 05:45  ? ?MR ABDOMEN WO CONTRAST ? ?Result Date: 05/24/2021 ?CLINICAL DATA:  Right lower quadrant abdominal pain in pregnancy. EXAM: MRI ABDOMEN AND PELVIS WITHOUT CONTRAST TECHNIQUE: Multiplanar multisequence MR imaging of the abdomen and pelvis was performed. No intravenous contrast was administered. COMPARISON:  05/22/2021. FINDINGS: COMBINED FINDINGS FOR BOTH MR ABDOMEN AND PELVIS Lower chest: No acute findings. Hepatobiliary: No suspicious liver abnormality. No gallbladder wall thickening or inflammation. Sludge noted layering within the gallbladder. No bile duct dilatation. Pancreas: No mass, inflammatory changes, or other parenchymal abnormality identified. Spleen:  Within normal limits in size and appearance. 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 No mass scratch set no suspicious mass or hydronephrosis identified bilaterally. Bladder is unremarkable. Stomach/Bowel: Stomach is normal. There are extensive inflammatory changes within the right hemiabdomen with diffuse soft tissue stranding and free fluid. The appendix is thickened and appears inflamed. Multiple appendicoliths are  noted, one of which appears extraluminal, which raises the concern for perforated appendicitis, image 32/15 and image 15/6. Possible early fluid collection surrounds the suspected extraluminal appendicolith measuring 3.2 x 0.7 by 2.8 cm (volume = 3 cm^3), image 14/14 and image 31/18. Vascular/Lymphatic: No pathologically enlarged lymph nodes identified. No abdominal aortic aneurysm demonstrated. Reproductive: Gravid uterus containing intrauterine gestational sac and embryo identified. Other: Moderate free fluid within the right hemiabdomen which extends over the right hepatic lobe and into the dependent portion of pelvis. Musculoskeletal: No suspicious bone lesions identified. IMPRESSION: 1. Acute appendicitis. 2. Concern for appendiceal perforation with multiple appendicoliths, 1 of these appears outside the lumen of the appendix. Here, there is possible early loculated fluid collection measuring approximately 3 cc. 3. Gravid uterus containing intrauterine gestational sac and embryo identified. Electronically Signed   By: Kerby Moors M.D.   On: 05/24/2021 05:45   ? ?Anti-infectives: ?Anti-infectives (From admission, onward)  ? ? Start     Dose/Rate Route Frequency Ordered Stop  ? 05/24/21 0900  metroNIDAZOLE (FLAGYL) IVPB 500 mg       ? 500 mg ?100 mL/hr over 60 Minutes Intravenous Every 12 hours 05/24/21 0806    ? 05/24/21 0830  cefTRIAXone (ROCEPHIN) 2 g in sodium chloride 0.9 % 100 mL IVPB       ? 2 g ?200 mL/hr over 30 Minutes Intravenous Every 24 hours 05/24/21 0806    ? ?  ? ? ? ?Assessment/Plan ?POD 1 s/p Laparoscopic Appendectomy for Acute appendicitis with generalized peritonitis - Dr. Brantley Stage, 05/24/21 ?- AF.  Tachy yesterday. Resolved this AM. VSS. WBC 17.2 (16.7) ?- Cont abx x 7d ?- Adv diet. High risk for ileus, monitor ?- Mobilize ?- Pulm toilet.  ?- Can transfer to 6N ?[redacted] weeks Pregnant - [place] w/ FHR 140's and good fetal movement performed by OB in PACU. They are following. Cont prenatal multi. Plans  to see [facility] after d/c.  ?Hypokalemia - resolved ?ABL anemia - hgb 9.5 (11.9). Recheck in AM. VSS this am. ? ?FEN - FLD, ADAT. SLIV ?VTE - SCDs, Lovenox ?ID - Rocephin/Flagyl 5/8 >>  ?Foley - None.

## 2021-05-25 NOTE — Consult Note (Signed)
Antepartum consult note ? ?Tina Gibbs is a 21 y.o. [phone] with Estimated Date of Delivery: 12/04/21   By  early ultrasound [redacted]w[redacted]d who is admitted for s/p appendectomy in pregnancy.   ? ?Subjective: ?Pt resting comfortably.  No some abdominal tenderness, but improvement with current medication.  No nausea/vomiting.  Tolerating clears.  Maybe feeling flutters.  Denies vaginal bleeding, irregular discharge.  No acute gyn concerns. ? ?Vitals:  Blood pressure 119/62, pulse 94, temperature 98 ?F (36.7 ?C), temperature source Oral, resp. rate 18, height '5\' 1"'$  (1.549 m), weight 62.6 kg, last menstrual period 02/08/2021, SpO2 98 %. ?Vitals:  ? 05/24/21 1500 05/24/21 2005 05/24/21 2317 05/25/21 0315  ?BP: 118/60 105/61 112/60 119/62  ?Pulse: 86 (!) 122 100 94  ?Resp: '18 16 18 18  '$ ?Temp: 98.2 ?F (36.8 ?C) 98.5 ?F (36.9 ?C) 98 ?F (36.7 ?C) 98 ?F (36.7 ?C)  ?TempSrc: Oral Oral Oral Oral  ?SpO2: 98% 99% 98% 98%  ?Weight:      ?Height:      ? ?Physical Examination: ? General appearance - alert, well appearing, and in no distress ?Mental status - normal mood, behavior, speech, dress, motor activity, and thought processes ?Chest - CTAB ?Heart - normal rate and regular rhythm ?Abdomen - soft, appropriately tender, +BS. ?Incision C/D/I ?Extremities - no edema, SCDs in place ?Skin - warm and dry ? ?Fetal Monitoring:  145 by doppler ? ?Labs:  ?Results for orders placed or performed during the hospital encounter of 05/24/21 (from the past 24 hour(s))  ?HIV Antibody (routine testing w rflx)  ? Collection Time: 05/24/21  9:31 AM  ?Result Value Ref Range  ? HIV Screen 4th Generation wRfx Non Reactive Non Reactive  ?Basic metabolic panel  ? Collection Time: 05/25/21  4:15 AM  ?Result Value Ref Range  ? Sodium 136 135 - 145 mmol/L  ? Potassium 3.7 3.5 - 5.1 mmol/L  ? Chloride 108 98 - 111 mmol/L  ? CO2 20 (L) 22 - 32 mmol/L  ? Glucose, Bld 106 (H) 70 - 99 mg/dL  ? BUN 5 (L) 6 - 20 mg/dL  ? Creatinine, Ser 0.48 0.44 - 1.00 mg/dL  ?  Calcium 8.7 (L) 8.9 - 10.3 mg/dL  ? GFR, Estimated >60 >60 mL/min  ? Anion gap 8 5 - 15  ?Magnesium  ? Collection Time: 05/25/21  4:15 AM  ?Result Value Ref Range  ? Magnesium 1.8 1.7 - 2.4 mg/dL  ?CBC  ? Collection Time: 05/25/21  4:15 AM  ?Result Value Ref Range  ? WBC 17.2 (H) 4.0 - 10.5 K/uL  ? RBC 2.94 (L) 3.87 - 5.11 MIL/uL  ? Hemoglobin 9.5 (L) 12.0 - 15.0 g/dL  ? HCT 27.8 (L) 36.0 - 46.0 %  ? MCV 94.6 80.0 - 100.0 fL  ? MCH 32.3 26.0 - 34.0 pg  ? MCHC 34.2 30.0 - 36.0 g/dL  ? RDW 13.1 11.5 - 15.5 %  ? Platelets 216 150 - 400 K/uL  ? nRBC 0.0 0.0 - 0.2 %  ? ? ?ASSESSMENT: ?G3P1011 155w3dstimated Date of Delivery: 12/04/21  ?S/p appendectomy ? ?PLAN: ?-Postop management per general surgery ?-From an OB standpoint, fetal well being reassuring ?-plan to follow up for next OB visit in 4wks ? ? ?Tina Genta5/09/2021,7:47 AM ? ? ? ?  ?

## 2021-05-25 NOTE — Progress Notes (Signed)
Patient has yet to have bowel movement or pass gas. Per patient, she takes Miralax regularly at home for constipation. Not tolerating meals due to nausea,per patient has not had much solid food. RN strongly encouraged patient to ambulate as this will help healing process and to pass gas. ?Danie Binder, RN  ?

## 2021-05-26 DIAGNOSIS — K3532 Acute appendicitis with perforation and localized peritonitis, without abscess: Secondary | ICD-10-CM | POA: Diagnosis not present

## 2021-05-26 LAB — CBC
HCT: 26.7 % — ABNORMAL LOW (ref 36.0–46.0)
Hemoglobin: 9.3 g/dL — ABNORMAL LOW (ref 12.0–15.0)
MCH: 32.6 pg (ref 26.0–34.0)
MCHC: 34.8 g/dL (ref 30.0–36.0)
MCV: 93.7 fL (ref 80.0–100.0)
Platelets: 214 10*3/uL (ref 150–400)
RBC: 2.85 MIL/uL — ABNORMAL LOW (ref 3.87–5.11)
RDW: 13.1 % (ref 11.5–15.5)
WBC: 12.4 10*3/uL — ABNORMAL HIGH (ref 4.0–10.5)
nRBC: 0 % (ref 0.0–0.2)

## 2021-05-26 MED ORDER — SODIUM CHLORIDE 0.9 % IV SOLN: INTRAVENOUS | Status: DC

## 2021-05-26 NOTE — Consult Note (Signed)
Antepartum consult note ? ?Tina Gibbs is a 21 y.o. [phone] with Estimated Date of Delivery: 12/04/21   By  early ultrasound [redacted]w[redacted]d who is admitted for s/p appendectomy in pregnancy.   ? ?Subjective: ?Pt resting comfortably.  Some abdominal tenderness, but improvement with current medication.  Denies vaginal bleeding, irregular discharge.  No acute gyn concerns. ? ?Vitals:  Blood pressure 125/77, pulse 93, temperature 97.7 ?F (36.5 ?C), temperature source Oral, resp. rate 18, height '5\' 1"'$  (1.549 m), weight 62.6 kg, last menstrual period 02/08/2021, SpO2 98 %. ?Vitals:  ? 05/25/21 2320 05/26/21 0542 05/26/21 0818 05/26/21 0820  ?BP: 112/71 104/67  125/77  ?Pulse: 97 94  93  ?Resp: '18 16 18   '$ ?Temp: 98.7 ?F (37.1 ?C) 98.7 ?F (37.1 ?C) 97.7 ?F (36.5 ?C)   ?TempSrc: Oral Oral Oral   ?SpO2: 96% 97%  98%  ?Weight:      ?Height:      ? ?Physical Examination: ?General appearance - alert, well appearing, and in no distress ?Mental status - normal mood, behavior, speech, dress, motor activity, and thought processes ?Chest - normal respiratory effort ?Abdomen - soft, appropriately tender, +BS. ?Incision C/D/I ?Extremities - no edema, SCDs in place ?Skin - warm and dry ? ?Fetal Monitoring:  152 by doppler ? ?Labs:  ?Results for orders placed or performed during the hospital encounter of 05/24/21 (from the past 24 hour(s))  ?CBC  ? Collection Time: 05/26/21  5:20 AM  ?Result Value Ref Range  ? WBC 12.4 (H) 4.0 - 10.5 K/uL  ? RBC 2.85 (L) 3.87 - 5.11 MIL/uL  ? Hemoglobin 9.3 (L) 12.0 - 15.0 g/dL  ? HCT 26.7 (L) 36.0 - 46.0 %  ? MCV 93.7 80.0 - 100.0 fL  ? MCH 32.6 26.0 - 34.0 pg  ? MCHC 34.8 30.0 - 36.0 g/dL  ? RDW 13.1 11.5 - 15.5 %  ? Platelets 214 150 - 400 K/uL  ? nRBC 0.0 0.0 - 0.2 %  ? ? ?ASSESSMENT: ?G3P1011 151w4dstimated Date of Delivery: 12/04/21  ?S/p appendectomy ? ?PLAN: ?-Postop management per general surgery ?-From an OB standpoint, fetal well being reassuring ?-plan to follow up for next OB visit in 4wks  (she can follow up with [facility] or [facility] group, whatever her preference) ?- We will sign off  ? ? ?PaRadene Gunning5/10/2021,11:36 AM ? ? ? ?  ?

## 2021-05-26 NOTE — Progress Notes (Signed)
? ? ?2 Days Post-Op  ?Subjective: ?CC: ?Febrile to 100.9 overnight. Tachy in the 120's. One soft BP 97/50. Vitals improved this am without fever, tachycardia or hypotension. WBC improved from 17.2 to 12.4.  ? ?On regular diet - only really taking in liquids but getting nauseated after. No vomiting. Having more pain, especially on the right side. No flatus or bm. Mobilizing. Voiding. No vaginal bleeding. OB has not been in yet to check baby.  ? ?Objective: ?Vital signs in last 24 hours: ?Temp:  [97.6 ?F (36.4 ?C)-100.9 ?F (38.3 ?C)] 98.7 ?F (37.1 ?C) (05/10 0542) ?Pulse Rate:  [86-125] 94 (05/10 0542) ?Resp:  [16-18] 16 (05/10 0542) ?BP: (97-121)/(50-71) 104/67 (05/10 0542) ?SpO2:  [96 %-99 %] 97 % (05/10 0542) ?Last BM Date : 05/21/21 ? ?Intake/Output from previous day: ?05/09 0701 - 05/10 0700 ?In: 720 [P.O.:720] ?Out: -  ?Intake/Output this shift: ?No intake/output data recorded. ? ?PE: ?Gen:  Alert, NAD, pleasant ?Card:  Reg ?Pulm:  CTAB, no W/R/R, effort normal ?Abd: Soft, gravid abdomen that appears ND, appropriately tender around laparoscopic incisions as well as some ttp on the right side of her abdomen but no peritonitis. +BS, laparoscopic incisions c/d/i ?Ext: SCDs in place. No edema of BLE's ?Psych: A&Ox3  ? ?Lab Results:  ?Recent Labs  ?  05/25/21 ?4008 05/26/21 ?6761  ?WBC 17.2* 12.4*  ?HGB 9.5* 9.3*  ?HCT 27.8* 26.7*  ?PLT 216 214  ? ?BMET ?Recent Labs  ?  05/24/21 ?9509 05/25/21 ?0415  ?NA 134* 136  ?K 2.8* 3.7  ?CL 101 108  ?CO2 22 20*  ?GLUCOSE 113* 106*  ?BUN 7 5*  ?CREATININE 0.76 0.48  ?CALCIUM 9.1 8.7*  ? ?PT/INR ?No results for input(s): LABPROT, INR in the last 72 hours. ?CMP  ?   ?Component Value Date/Time  ? NA 136 05/25/2021 0415  ? K 3.7 05/25/2021 0415  ? CL 108 05/25/2021 0415  ? CO2 20 (L) 05/25/2021 0415  ? GLUCOSE 106 (H) 05/25/2021 0415  ? BUN 5 (L) 05/25/2021 0415  ? CREATININE 0.48 05/25/2021 0415  ? CALCIUM 8.7 (L) 05/25/2021 0415  ? PROT 6.4 (L) 05/24/2021 0039  ? ALBUMIN 3.2 (L)  05/24/2021 0039  ? AST 13 (L) 05/24/2021 0039  ? ALT 12 05/24/2021 0039  ? ALKPHOS 45 05/24/2021 0039  ? BILITOT 0.9 05/24/2021 0039  ? GFRNONAA >60 05/25/2021 0415  ? GFRAA >60 02/07/2019 1830  ? ?Lipase  ?   ?Component Value Date/Time  ? LIPASE 23 05/24/2021 0039  ? ? ?Studies/Results: ?No results found. ? ?Anti-infectives: ?Anti-infectives (From admission, onward)  ? ? Start     Dose/Rate Route Frequency Ordered Stop  ? 05/24/21 0900  metroNIDAZOLE (FLAGYL) IVPB 500 mg       ? 500 mg ?100 mL/hr over 60 Minutes Intravenous Every 12 hours 05/24/21 0806    ? 05/24/21 0830  cefTRIAXone (ROCEPHIN) 2 g in sodium chloride 0.9 % 100 mL IVPB       ? 2 g ?200 mL/hr over 30 Minutes Intravenous Every 24 hours 05/24/21 0806    ? ?  ? ? ? ?Assessment/Plan ?POD 2 s/p Laparoscopic Appendectomy for acute gangrenous appendicitis with generalized peritonitis - Dr. Brantley Stage, 05/24/21 ?- Febrile to 100.9, tachy in the 120's and one soft BP 97/50 overnight Vitals improved this am without fever, tachycardia or hypotension. WBC improved from 17.2 to 12.4.  ?- Monitor fever and wbc curve. High risk for abscess ?- Cont abx x 7d ?- Back down  to cld. Suspect she is developing an ileus. ?- Path: Gangrenous appendicitis with perforation ?- Mobilize ?- Pulm toilet.  ?- Can transfer to 6N, orders written for ?[redacted] weeks Pregnant - [place] w/ FHR 140's and good fetal movement performed by OB in PACU. They are following. F/u on recs today after they see her. Cont prenatal multi. Plans to see [facility] after d/c.  ?Hypokalemia - resolved. 3.7 5/9 ?ABL anemia - stable at 9.3 ?  ?FEN - CLD. IVF at 11m/hr. Bowel regimen ?VTE - SCDs, Lovenox ?ID - Rocephin/Flagyl 5/8 >>  ?Foley - None. Voiding. Good UOP. ? ? ? LOS: 2 days  ? ? ?MJillyn Ledger, PA-C ?[facility] ?05/26/2021, 7:05 AM ?Please see Amion for pager number during day hours 7:00am-4:30pm ? ?

## 2021-05-27 LAB — BASIC METABOLIC PANEL
Anion gap: 5 (ref 5–15)
BUN: <5 mg/dL — ABNORMAL LOW (ref 6–20)
CO2: 26 mmol/L (ref 22–32)
Calcium: 8.1 mg/dL — ABNORMAL LOW (ref 8.9–10.3)
Chloride: 103 mmol/L (ref 98–111)
Creatinine, Ser: 0.45 mg/dL (ref 0.44–1.00)
GFR, Estimated: >60 mL/min (ref >60)
Glucose, Bld: 88 mg/dL (ref 70–99)
Potassium: 3.1 mmol/L — ABNORMAL LOW (ref 3.5–5.1)
Sodium: 134 mmol/L — ABNORMAL LOW (ref 135–145)

## 2021-05-27 LAB — URINALYSIS, COMPLETE (UACMP) WITH MICROSCOPIC
Bilirubin Urine: NEGATIVE
Glucose, UA: NEGATIVE mg/dL
Ketones, ur: 80 mg/dL — AB
Nitrite: NEGATIVE
Protein, ur: NEGATIVE mg/dL
Specific Gravity, Urine: 1.012 (ref 1.005–1.030)
pH: 7 (ref 5.0–8.0)

## 2021-05-27 LAB — CBC
HCT: 29.6 % — ABNORMAL LOW (ref 36.0–46.0)
Hemoglobin: 10.3 g/dL — ABNORMAL LOW (ref 12.0–15.0)
MCH: 32.5 pg (ref 26.0–34.0)
MCHC: 34.8 g/dL (ref 30.0–36.0)
MCV: 93.4 fL (ref 80.0–100.0)
Platelets: 244 10*3/uL (ref 150–400)
RBC: 3.17 MIL/uL — ABNORMAL LOW (ref 3.87–5.11)
RDW: 12.8 % (ref 11.5–15.5)
WBC: 12.5 10*3/uL — ABNORMAL HIGH (ref 4.0–10.5)
nRBC: 0 % (ref 0.0–0.2)

## 2021-05-27 MED ORDER — POTASSIUM CHLORIDE CRYS ER 20 MEQ PO TBCR
40.0000 meq | EXTENDED_RELEASE_TABLET | Freq: Two times a day (BID) | ORAL | Status: AC
  Administered 2021-05-27 (×2): 40 meq via ORAL
  Filled 2021-05-27 (×2): qty 2

## 2021-05-27 NOTE — Progress Notes (Signed)
? ? ?3 Days Post-Op  ?Subjective: ?CC: ?Stable right sided abdominal pain from yesterday. Tolerating cld. Reports baseline nausea with pregnancy first thing in the morning and at night but also had some nausea yesterday afternoon. No worsening nausea after po intake. No emesis. Denies flatus or bm. Mobilizing in room. RN about to ambulate in hall. No vaginal bleeding. Voiding with some dysuria.  ? ?Objective: ?Vital signs in last 24 hours: ?Temp:  [98 ?F (36.7 ?C)-99.6 ?F (37.6 ?C)] 98.6 ?F (37 ?C) (05/11 0840) ?Pulse Rate:  [89-109] 89 (05/11 0840) ?Resp:  [18] 18 (05/11 0840) ?BP: (112-123)/(63-68) 123/68 (05/11 0840) ?SpO2:  [97 %-100 %] 98 % (05/11 0840) ?Last BM Date : 05/21/21 ? ?Intake/Output from previous day: ?05/10 0701 - 05/11 0700 ?In: 1137.8 [P.O.:170; I.V.:767.8; IV Piggyback:200] ?Out: 3000 [Urine:3000] ?Intake/Output this shift: ?Total I/O ?In: -  ?Out: 250 [Urine:250] ? ?PE: ?Gen:  Alert, NAD, pleasant ?Card:  Reg ?Pulm:  CTAB, no W/R/R, effort normal ?Abd: Soft, gravid abdomen that appears ND, appropriately tender around laparoscopic incisions as well as some ttp on the right side of her abdomen but no peritonitis. +BS, laparoscopic incisions c/d/i ?Ext: No edema of BLE's ?Psych: A&Ox3  ? ?Lab Results:  ?Recent Labs  ?  05/26/21 ?7680 05/27/21 ?0408  ?WBC 12.4* 12.5*  ?HGB 9.3* 10.3*  ?HCT 26.7* 29.6*  ?PLT 214 244  ? ?BMET ?Recent Labs  ?  05/25/21 ?0415 05/27/21 ?0408  ?NA 136 134*  ?K 3.7 3.1*  ?CL 108 103  ?CO2 20* 26  ?GLUCOSE 106* 88  ?BUN 5* <5*  ?CREATININE 0.48 0.45  ?CALCIUM 8.7* 8.1*  ? ?PT/INR ?No results for input(s): LABPROT, INR in the last 72 hours. ?CMP  ?   ?Component Value Date/Time  ? NA 134 (L) 05/27/2021 0408  ? K 3.1 (L) 05/27/2021 0408  ? CL 103 05/27/2021 0408  ? CO2 26 05/27/2021 0408  ? GLUCOSE 88 05/27/2021 0408  ? BUN <5 (L) 05/27/2021 0408  ? CREATININE 0.45 05/27/2021 0408  ? CALCIUM 8.1 (L) 05/27/2021 0408  ? PROT 6.4 (L) 05/24/2021 0039  ? ALBUMIN 3.2 (L)  05/24/2021 0039  ? AST 13 (L) 05/24/2021 0039  ? ALT 12 05/24/2021 0039  ? ALKPHOS 45 05/24/2021 0039  ? BILITOT 0.9 05/24/2021 0039  ? GFRNONAA >60 05/27/2021 0408  ? GFRAA >60 02/07/2019 1830  ? ?Lipase  ?   ?Component Value Date/Time  ? LIPASE 23 05/24/2021 0039  ? ? ?Studies/Results: ?No results found. ? ?Anti-infectives: ?Anti-infectives (From admission, onward)  ? ? Start     Dose/Rate Route Frequency Ordered Stop  ? 05/24/21 0900  metroNIDAZOLE (FLAGYL) IVPB 500 mg       ? 500 mg ?100 mL/hr over 60 Minutes Intravenous Every 12 hours 05/24/21 0806    ? 05/24/21 0830  cefTRIAXone (ROCEPHIN) 2 g in sodium chloride 0.9 % 100 mL IVPB       ? 2 g ?200 mL/hr over 30 Minutes Intravenous Every 24 hours 05/24/21 0806    ? ?  ? ? ? ?Assessment/Plan ?POD 3 s/p Laparoscopic Appendectomy for acute gangrenous appendicitis with generalized peritonitis - Dr. Brantley Stage, 05/24/21 ?- Afebrile overnight. Tachycardia improved. No hypotension. WBC stable  ?- Monitor fever and wbc curve. High risk for abscess ?- Cont abx x 7d ?- Suspect she may have a mild ileus. [place] for FLD. Would not advance for till ROBF.  ?- Path: Gangrenous appendicitis with perforation ?- Mobilize ?- Pulm toilet.  ?- Can  transfer to 6N, orders written for ?[redacted] weeks Pregnant - [place] w/ FHR 140's and good fetal movement performed by OB in PACU. They are following. F/u on recs today after they see her. Cont prenatal multi. Plans to see [facility] after d/c.  ?Hypokalemia -  K 3.1, replace ?ABL anemia - stable at 10.3 ?  ?FEN - FLD. IVF at 70m/hr. Bowel regimen ?VTE - SCDs, Lovenox ?ID - Rocephin/Flagyl 5/8 >>  ?Foley - None. Voiding. Good UOP. Check UA ? ? LOS: 3 days  ? ? ?MJillyn Ledger, PA-C ?[facility] ?05/27/2021, 8:42 AM ?Please see Amion for pager number during day hours 7:00am-4:30pm ? ?

## 2021-05-27 NOTE — Progress Notes (Signed)
I verify that I witnessed the waste of '10mg'$  oxycodone in the stericycle by Carlota Raspberry RN.  ?

## 2021-05-27 NOTE — Progress Notes (Signed)
$'10mg'K$  oxycodone wasted in stericycle. Unable to chart waste in [place]. [facility]. Waste witnessed by [employer].  ?

## 2021-05-28 LAB — BASIC METABOLIC PANEL
Anion gap: 9 (ref 5–15)
BUN: 5 mg/dL — ABNORMAL LOW (ref 6–20)
CO2: 22 mmol/L (ref 22–32)
Calcium: 8.2 mg/dL — ABNORMAL LOW (ref 8.9–10.3)
Chloride: 105 mmol/L (ref 98–111)
Creatinine, Ser: 0.43 mg/dL — ABNORMAL LOW (ref 0.44–1.00)
GFR, Estimated: >60 mL/min (ref >60)
Glucose, Bld: 80 mg/dL (ref 70–99)
Potassium: 3.8 mmol/L (ref 3.5–5.1)
Sodium: 136 mmol/L (ref 135–145)

## 2021-05-28 LAB — CBC
HCT: 27.7 % — ABNORMAL LOW (ref 36.0–46.0)
Hemoglobin: 9.7 g/dL — ABNORMAL LOW (ref 12.0–15.0)
MCH: 32.3 pg (ref 26.0–34.0)
MCHC: 35 g/dL (ref 30.0–36.0)
MCV: 92.3 fL (ref 80.0–100.0)
Platelets: 261 10*3/uL (ref 150–400)
RBC: 3 MIL/uL — ABNORMAL LOW (ref 3.87–5.11)
RDW: 12.7 % (ref 11.5–15.5)
WBC: 13.7 10*3/uL — ABNORMAL HIGH (ref 4.0–10.5)
nRBC: 0 % (ref 0.0–0.2)

## 2021-05-28 MED ORDER — METOCLOPRAMIDE HCL 5 MG/ML IJ SOLN
10.0000 mg | Freq: Four times a day (QID) | INTRAMUSCULAR | Status: DC
  Administered 2021-05-28 – 2021-05-31 (×11): 10 mg via INTRAVENOUS
  Filled 2021-05-28 (×11): qty 2

## 2021-05-28 NOTE — Progress Notes (Signed)
? ? ?4 Days Post-Op  ?Subjective: ?CC: ?Feeling much better. R sided abdominal pain is greatly improved and only mild today along with soreness around incisions. Tolerating fld. Notes her nausea now feels back at baseline for her pregnancy and has improved. Started passing flatus and has had several episodes this am. No BM. Voiding without dysuria now. No vaginal bleeding. Mobilizing in halls.  ? ?Objective: ?Vital signs in last 24 hours: ?Temp:  [98.2 ?F (36.8 ?C)-99.9 ?F (37.7 ?C)] 99.9 ?F (37.7 ?C) (05/11 2018) ?Pulse Rate:  [89-119] 97 (05/11 2018) ?Resp:  [17-18] 17 (05/11 2018) ?BP: (105-123)/(52-68) 108/54 (05/11 2018) ?SpO2:  [96 %-99 %] 99 % (05/11 2018) ?Last BM Date : 05/21/21 ? ?Intake/Output from previous day: ?05/11 0701 - 05/12 0700 ?In: -  ?Out: 2050 [Urine:2050] ?Intake/Output this shift: ?No intake/output data recorded. ? ?PE: ?Gen:  Alert, NAD, pleasant ?Card:  Reg ?Pulm:  CTAB, no W/R/R, effort normal ?Abd: Soft, gravid abdomen that appears ND, appropriately tender around laparoscopic incisions as well as some ttp on the right side of her abdomen but no peritonitis. +BS, laparoscopic incisions c/d/i ?Ext: No edema of BLE's ?Psych: A&Ox3  ? ?Lab Results:  ?Recent Labs  ?  05/27/21 ?0408 05/28/21 ?6659  ?WBC 12.5* 13.7*  ?HGB 10.3* 9.7*  ?HCT 29.6* 27.7*  ?PLT 244 261  ? ?BMET ?Recent Labs  ?  05/27/21 ?0408 05/28/21 ?9357  ?NA 134* 136  ?K 3.1* 3.8  ?CL 103 105  ?CO2 26 22  ?GLUCOSE 88 80  ?BUN <5* 5*  ?CREATININE 0.45 0.43*  ?CALCIUM 8.1* 8.2*  ? ?PT/INR ?No results for input(s): LABPROT, INR in the last 72 hours. ?CMP  ?   ?Component Value Date/Time  ? NA 136 05/28/2021 0459  ? K 3.8 05/28/2021 0459  ? CL 105 05/28/2021 0459  ? CO2 22 05/28/2021 0459  ? GLUCOSE 80 05/28/2021 0459  ? BUN 5 (L) 05/28/2021 0459  ? CREATININE 0.43 (L) 05/28/2021 0459  ? CALCIUM 8.2 (L) 05/28/2021 0459  ? PROT 6.4 (L) 05/24/2021 0039  ? ALBUMIN 3.2 (L) 05/24/2021 0039  ? AST 13 (L) 05/24/2021 0039  ? ALT 12  05/24/2021 0039  ? ALKPHOS 45 05/24/2021 0039  ? BILITOT 0.9 05/24/2021 0039  ? GFRNONAA >60 05/28/2021 0459  ? GFRAA >60 02/07/2019 1830  ? ?Lipase  ?   ?Component Value Date/Time  ? LIPASE 23 05/24/2021 0039  ? ? ?Studies/Results: ?No results found. ? ?Anti-infectives: ?Anti-infectives (From admission, onward)  ? ? Start     Dose/Rate Route Frequency Ordered Stop  ? 05/24/21 0900  metroNIDAZOLE (FLAGYL) IVPB 500 mg       ? 500 mg ?100 mL/hr over 60 Minutes Intravenous Every 12 hours 05/24/21 0806    ? 05/24/21 0830  cefTRIAXone (ROCEPHIN) 2 g in sodium chloride 0.9 % 100 mL IVPB       ? 2 g ?200 mL/hr over 30 Minutes Intravenous Every 24 hours 05/24/21 0806    ? ?  ? ? ? ?Assessment/Plan ?POD 4 s/p Laparoscopic Appendectomy for acute gangrenous appendicitis with generalized peritonitis - Dr. Brantley Stage, 05/24/21 ?- Tmax 99.9. Tachy overnight but improved this am. No SBP hypotension. WBC slightly up at 13.7 ?- Monitor fever and wbc curve. High risk for abscess. Suspect she may need MRI to r/o abscess on POD 5 ?- Cont abx for at least 7d post op ?- Ileus seems to be improving. Advance diet.  ?- Path: Gangrenous appendicitis with perforation ?- Mobilize ?-  Pulm toilet.  ?- Can transfer to 6N, orders written for ?[redacted] weeks Pregnant - [place] w/ FHR 140's and good fetal movement performed by OB in PACU. They have been following but did not see a note from yesterday. Will reach out today to ensure they are still following. Cont prenatal multi. Plans to see [facility] after d/c.  ?Hypokalemia -  Resolved ?ABL anemia - 9.7. AM labs.  ?  ?FEN - Soft. IVF at 62m/hr. Bowel regimen ?VTE - SCDs, Lovenox ?ID - Rocephin/Flagyl 5/8 >>  ?Foley - None. Voiding. Good UOP. UCx ? ? LOS: 4 days  ? ? ?MJillyn Ledger, PA-C ?[facility] ?05/28/2021, 7:56 AM ?Please see Amion for pager number during day hours 7:00am-4:30pm ? ?

## 2021-05-29 ENCOUNTER — Inpatient Hospital Stay (HOSPITAL_COMMUNITY): Payer: Medicaid Other

## 2021-05-29 LAB — CULTURE, BLOOD (ROUTINE X 2)
Culture: NO GROWTH
Culture: NO GROWTH
Special Requests: ADEQUATE
Special Requests: ADEQUATE

## 2021-05-29 LAB — CBC
HCT: 28.1 % — ABNORMAL LOW (ref 36.0–46.0)
Hemoglobin: 9.8 g/dL — ABNORMAL LOW (ref 12.0–15.0)
MCH: 32.6 pg (ref 26.0–34.0)
MCHC: 34.9 g/dL (ref 30.0–36.0)
MCV: 93.4 fL (ref 80.0–100.0)
Platelets: 279 10*3/uL (ref 150–400)
RBC: 3.01 MIL/uL — ABNORMAL LOW (ref 3.87–5.11)
RDW: 12.7 % (ref 11.5–15.5)
WBC: 16.9 10*3/uL — ABNORMAL HIGH (ref 4.0–10.5)
nRBC: 0 % (ref 0.0–0.2)

## 2021-05-29 IMAGING — MR MR ABDOMEN W/O CM
9 series · 48 of 48 positions shown · non-contrast
Comparison: MRI abdomen [DATE]

CLINICAL DATA: Peritonitis suspected. Recent appendicitis and
surgery.

EXAM:
MRI ABDOMEN WITHOUT CONTRAST
TECHNIQUE: Multiplanar multisequence MR imaging was performed without the
administration of intravenous contrast.

[Series 4: cor haste · coronal · 6.0mm · 1.19mm/px · 2 of 28 slices shown]
[im 1/28]
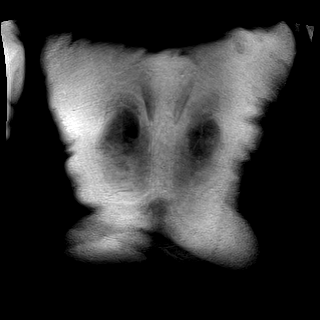
[im 28/28]
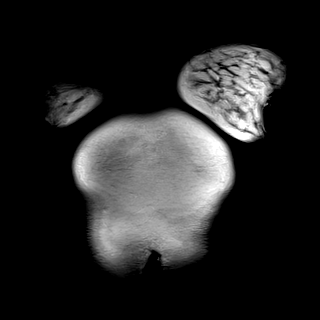

[Series 5: ax haste · axial · 6.0mm · 1.19mm/px · z∈[-242,+10]mm · 3 of 36 slices shown]
[im 1/36]
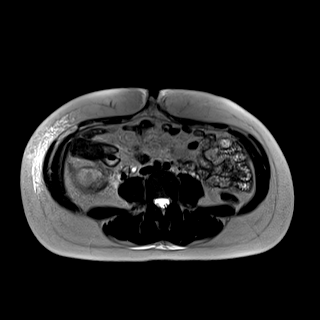
[im 18/36]
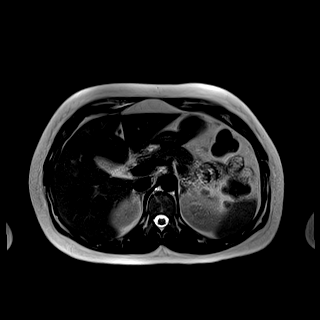
[im 36/36]
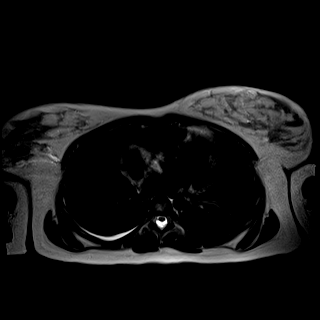

[Series 8: T2 fat-sat · axial · 6.0mm · 1.19mm/px · z∈[-242,+10]mm · 3 of 36 slices shown]
[im 1/36]
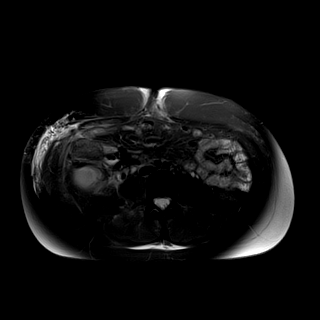
[im 18/36]
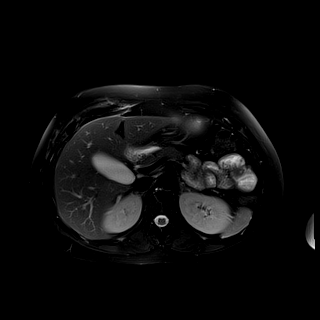
[im 36/36]
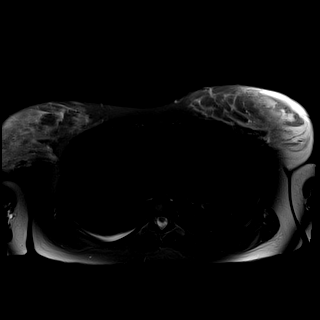

[Series 9: t1_vibe_opp-in_tra_p4_bh · axial · 3.0mm · 1.19mm/px · z∈[-246,+15]mm · 8 of 88 slices shown (1 of 2)]
[im 1/88]
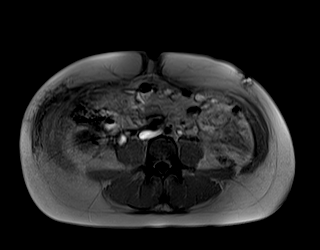
[im 13/88]
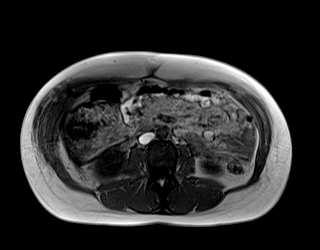
[im 25/88]
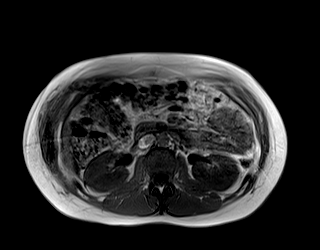
[im 38/88]
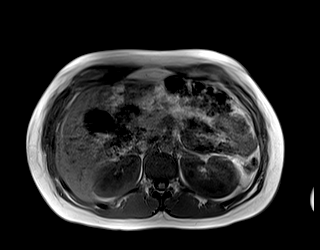
[im 50/88]
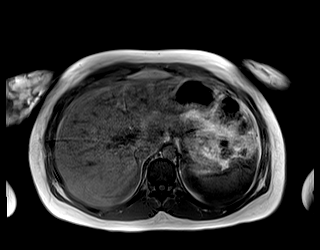
[im 63/88]
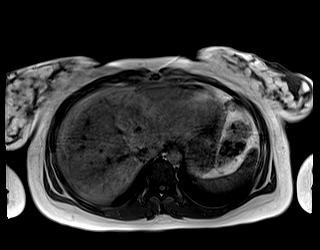
[im 75/88]
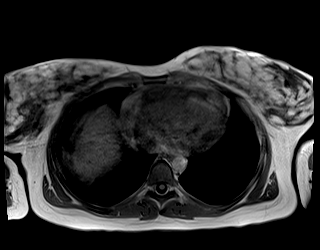
[im 88/88]
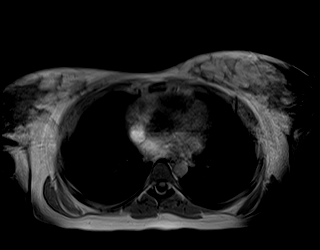

[Series 9: t1_vibe_opp-in_tra_p4_bh · axial · 3.0mm · 1.19mm/px · z∈[-246,+15]mm · 8 of 88 slices shown (2 of 2)]
[im 1/88]
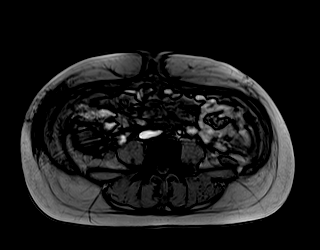
[im 13/88]
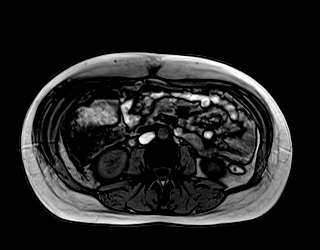
[im 25/88]
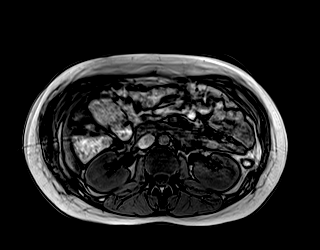
[im 38/88]
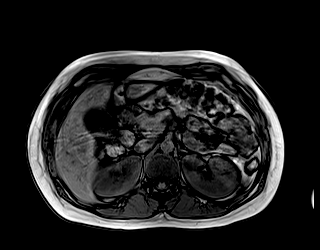
[im 50/88]
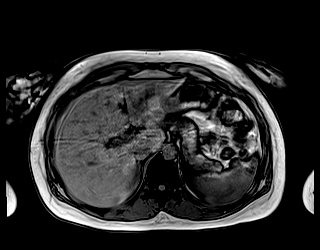
[im 63/88]
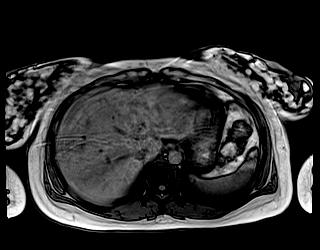
[im 75/88]
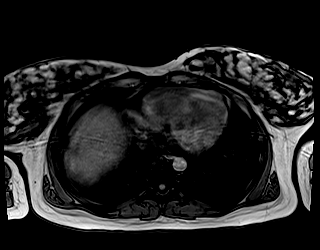
[im 88/88]
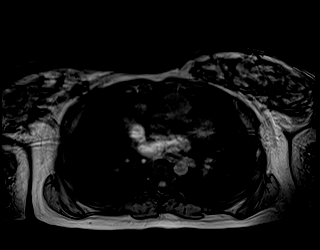

[Series 10: DWI · axial · 6.0mm · 1.42mm/px · z∈[-242,+10]mm · 10 of 108 slices shown (1 of 2)]
[im 1/108]
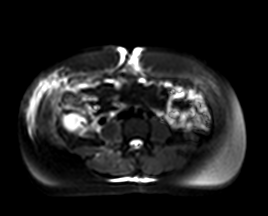
[im 12/108]
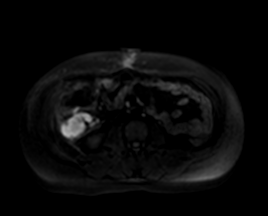
[im 24/108]
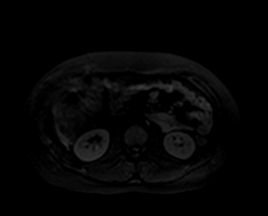
[im 36/108]
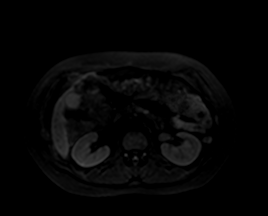
[im 48/108]
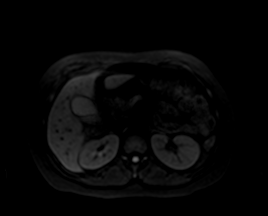
[im 60/108]
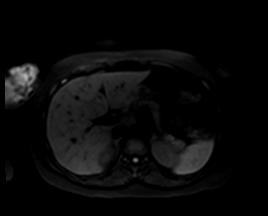
[im 72/108]
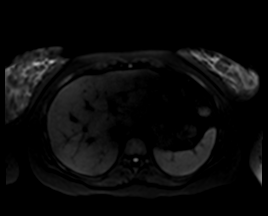
[im 84/108]
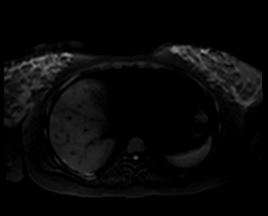
[im 96/108]
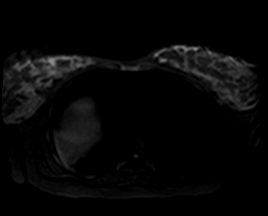
[im 108/108]
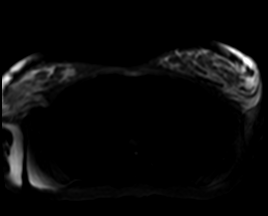

[Series 11: DWI · axial · 6.0mm · 1.42mm/px · z∈[-242,+10]mm · 3 of 36 slices shown (2 of 2)]
[im 1/36]
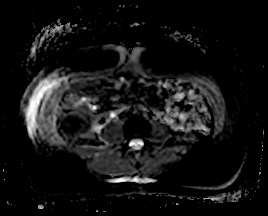
[im 18/36]
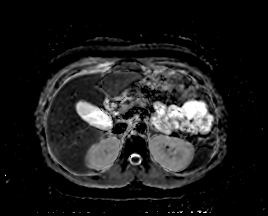
[im 36/36]
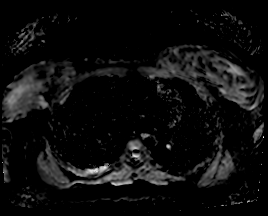

[Series 12: bSSFP · axial · 6.0mm · 0.74mm/px · z∈[-242,+10]mm · 3 of 36 slices shown]
[im 1/36]
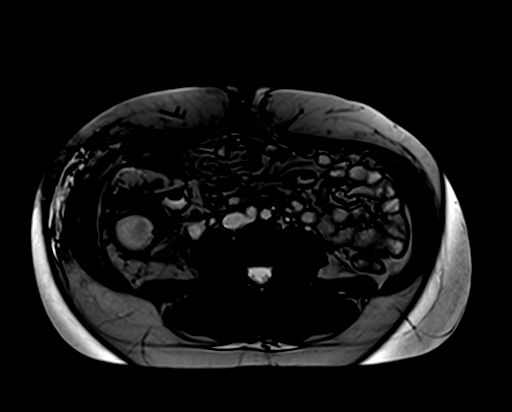
[im 18/36]
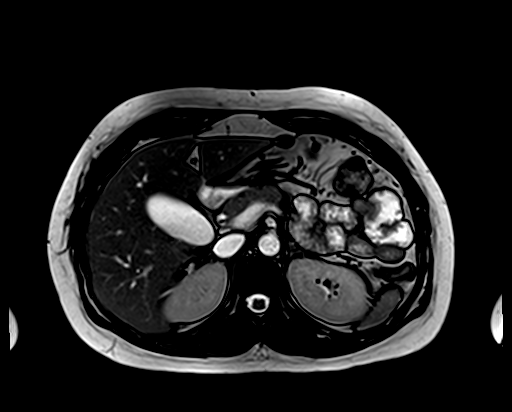
[im 36/36]
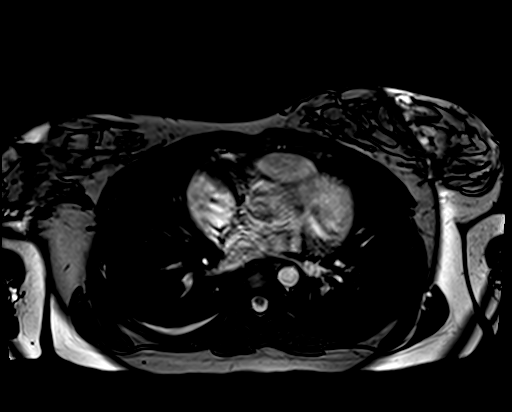

[Series 13: t1_vibe_fs_tra_p4_bh_pre · axial · 3.0mm · 1.19mm/px · z∈[-246,+15]mm · 8 of 88 slices shown]
[im 1/88]
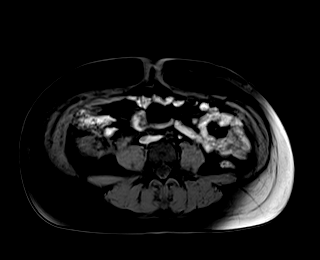
[im 13/88]
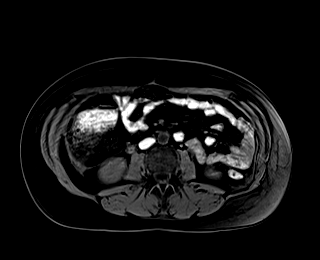
[im 25/88]
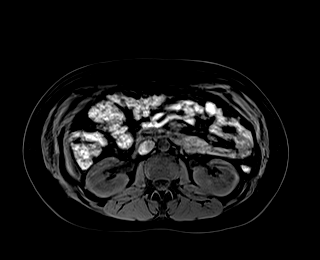
[im 38/88]
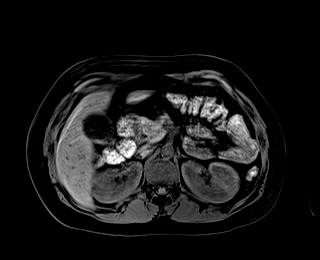
[im 50/88]
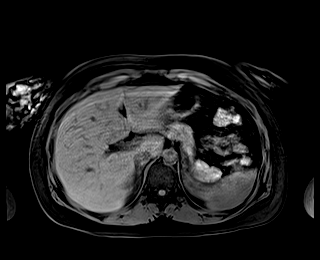
[im 63/88]
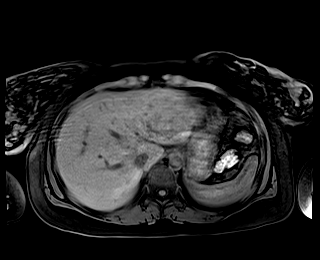
[im 75/88]
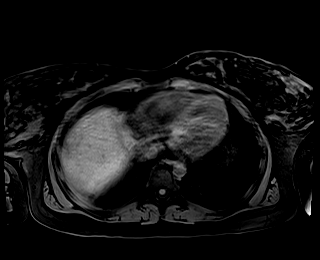
[im 88/88]
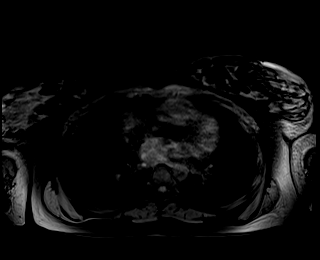

[48 of 48 positions shown; findings below may reference images not displayed]

FINDINGS: Lower chest: Trace right pleural effusion.

Hepatobiliary: Liver is normal in size and contour with no
suspicious mass identified. Gallbladder appears within normal
limits. No biliary ductal dilatation identified.

Pancreas: No mass, inflammatory changes, or other parenchymal
abnormality identified.

Spleen:  Within normal limits in size and appearance.

Adrenals/Urinary Tract: Small cyst in the left kidney. No
hydronephrosis. Adrenal glands appear normal.

Stomach/Bowel: No evidence of bowel obstruction.

Vascular/Lymphatic: No pathologically enlarged lymph nodes
identified. No abdominal aortic aneurysm demonstrated.

Other: Postsurgical changes of the right lateral abdominal wall and
umbilical region are partially seen with soft tissue edema, more so
on the right. Within the right lower quadrant of the abdomen
posterior to the cecum, there is a partially visualized irregular
hyperintense T2, hypointense T1 signal collection which demonstrates
diffusion restriction and measures approximately 4.4 x 2.6 x 4.4 cm.
Small amount of fluid tracking along the right pericolic gutter also
noted which does not demonstrate diffusion restriction.

Musculoskeletal: No suspicious bone lesions identified.
IMPRESSION: 1. Partially visualized fluid collection in the right lower quadrant
posterior to the cecum which demonstrates diffusion restriction and
is highly suspicious for abscess.
2. Postsurgical changes of the anterior and right lateral abdominal
wall with associated soft tissue edema, greater on the right.
3. Trace right pleural effusion.

## 2021-05-29 MED ORDER — PHENYLEPHRINE-MINERAL OIL-PET 0.25-14-74.9 % RE OINT: 1.0000 "application " | TOPICAL_OINTMENT | Freq: Two times a day (BID) | RECTAL | Status: DC | PRN

## 2021-05-29 MED ORDER — HYDROCORTISONE (PERIANAL) 2.5 % EX CREA
TOPICAL_CREAM | Freq: Two times a day (BID) | CUTANEOUS | Status: DC | PRN
  Filled 2021-05-29: qty 28.35

## 2021-05-29 MED ORDER — FLUCONAZOLE 150 MG PO TABS
150.0000 mg | ORAL_TABLET | Freq: Once | ORAL | Status: AC
Start: 2021-05-29 — End: 2021-05-30
  Administered 2021-05-30: 150 mg via ORAL
  Filled 2021-05-29 (×2): qty 1

## 2021-05-29 NOTE — Progress Notes (Signed)
Patient ID: Tina Gibbs, female   DOB: 01/27/2000, 21 y.o.   MRN: [id number] ?[facility] Progress Note:   5 Days Post-Op  ?Subjective: ?Mental status is clear.  Complaints right sided pain . ?Objective: ?Vital signs in last 24 hours: ?Temp:  [97.6 ?F (36.4 ?C)-98.5 ?F (36.9 ?C)] 98.5 ?F (36.9 ?C) (05/13 4287) ?Pulse Rate:  [78-86] 82 (05/13 0648) ?Resp:  [16-18] 17 (05/13 6811) ?BP: (115-120)/(62-80) 115/80 (05/13 5726) ?SpO2:  [98 %-100 %] 100 % (05/13 2035) ? ?Intake/Output from previous day: ?No intake/output data recorded. ?Intake/Output this shift: ?No intake/output data recorded. ? ?Physical Exam: Work of breathing is OK.  Pain in right gutter-?abscess ? ?Lab Results:  ?Results for orders placed or performed during the hospital encounter of 05/24/21 (from the past 48 hour(s))  ?Urinalysis, Complete w Microscopic     Status: Abnormal  ? Collection Time: 05/27/21 10:15 AM  ?Result Value Ref Range  ? Color, Urine YELLOW YELLOW  ? APPearance CLEAR CLEAR  ? Specific Gravity, Urine 1.012 1.005 - 1.030  ? pH 7.0 5.0 - 8.0  ? Glucose, UA NEGATIVE NEGATIVE mg/dL  ? Hgb urine dipstick MODERATE (A) NEGATIVE  ? Bilirubin Urine NEGATIVE NEGATIVE  ? Ketones, ur 80 (A) NEGATIVE mg/dL  ? Protein, ur NEGATIVE NEGATIVE mg/dL  ? Nitrite NEGATIVE NEGATIVE  ? Leukocytes,Ua TRACE (A) NEGATIVE  ? RBC / HPF 11-20 0 - 5 RBC/hpf  ? WBC, UA 0-5 0 - 5 WBC/hpf  ? Bacteria, UA RARE (A) NONE SEEN  ? Squamous Epithelial / LPF 0-5 0 - 5  ? Mucus PRESENT   ?  Comment: Performed at [facility], [place] [address] [place], [place] [address]  ?CBC     Status: Abnormal  ? Collection Time: 05/28/21  4:59 AM  ?Result Value Ref Range  ? WBC 13.7 (H) 4.0 - 10.5 K/uL  ? RBC 3.00 (L) 3.87 - 5.11 MIL/uL  ? Hemoglobin 9.7 (L) 12.0 - 15.0 g/dL  ? HCT 27.7 (L) 36.0 - 46.0 %  ? MCV 92.3 80.0 - 100.0 fL  ? MCH 32.3 26.0 - 34.0 pg  ? MCHC 35.0 30.0 - 36.0 g/dL  ? RDW 12.7 11.5 - 15.5 %  ? Platelets 261 150 - 400 K/uL  ? nRBC 0.0 0.0 - 0.2  %  ?  Comment: Performed at [facility], [place] [address] [place], [place] [address]  ?Basic metabolic panel     Status: Abnormal  ? Collection Time: 05/28/21  4:59 AM  ?Result Value Ref Range  ? Sodium 136 135 - 145 mmol/L  ? Potassium 3.8 3.5 - 5.1 mmol/L  ?  Comment: DELTA CHECK NOTED  ? Chloride 105 98 - 111 mmol/L  ? CO2 22 22 - 32 mmol/L  ? Glucose, Bld 80 70 - 99 mg/dL  ?  Comment: Glucose reference range applies only to samples taken after fasting for at least 8 hours.  ? BUN 5 (L) 6 - 20 mg/dL  ? Creatinine, Ser 0.43 (L) 0.44 - 1.00 mg/dL  ? Calcium 8.2 (L) 8.9 - 10.3 mg/dL  ? GFR, Estimated >60 >60 mL/min  ?  Comment: (NOTE) ?Calculated using the CKD-EPI Creatinine Equation (2021) ?  ? Anion gap 9 5 - 15  ?  Comment: Performed at [facility], [place] [address] [place], [place] [address]  ?CBC     Status: Abnormal  ? Collection Time: 05/29/21  1:50 AM  ?Result Value Ref Range  ?  WBC 16.9 (H) 4.0 - 10.5 K/uL  ? RBC 3.01 (L) 3.87 - 5.11 MIL/uL  ? Hemoglobin 9.8 (L) 12.0 - 15.0 g/dL  ? HCT 28.1 (L) 36.0 - 46.0 %  ? MCV 93.4 80.0 - 100.0 fL  ? MCH 32.6 26.0 - 34.0 pg  ? MCHC 34.9 30.0 - 36.0 g/dL  ? RDW 12.7 11.5 - 15.5 %  ? Platelets 279 150 - 400 K/uL  ? nRBC 0.0 0.0 - 0.2 %  ?  Comment: Performed at [facility], [place] [address], [place], [place] [address]  ? ? ?Radiology/Results: ?No results found. ? ?Anti-infectives: ?Anti-infectives (From admission, onward)  ? ? Start     Dose/Rate Route Frequency Ordered Stop  ? 05/24/21 0900  metroNIDAZOLE (FLAGYL) IVPB 500 mg       ? 500 mg ?100 mL/hr over 60 Minutes Intravenous Every 12 hours 05/24/21 0806    ? 05/24/21 0830  cefTRIAXone (ROCEPHIN) 2 g in sodium chloride 0.9 % 100 mL IVPB       ? 2 g ?200 mL/hr over 30 Minutes Intravenous Every 24 hours 05/24/21 0806    ? ?  ? ? ?Assessment/Plan: ?Problem List: ?Patient Active Problem List  ? Diagnosis Date Noted  ? Acute perforated appendicitis 05/24/2021  ? Non-reassuring electronic fetal monitoring  tracing 02/07/2019  ? Anemia in pregnancy 12/03/2018  ? UTI in pregnancy 08/22/2018  ? GBS (group B streptococcus) UTI complicating pregnancy 47/42/5956  ? Anxiety and depression 08/15/2018  ? Supervision of high risk pregnancy, antepartum 08/01/2018  ? Asthma   ? Influenza vaccination declined 03/06/2015  ? Unspecified asthma(493.90) 10/15/2012  ? Seasonal allergies 10/15/2012  ? ADHD (attention deficit hyperactivity disorder) 10/15/2012  ? ? ?Will get MRI of abdomen to look for abscess formation.   ?5 Days Post-Op  ? ? LOS: 5 days  ? ?Matt B. Hassell Done, MD, FACS ? ?[facility] ?[phone] to reach the surgeon on call.   ? ?05/29/2021 9:27 AM  ?

## 2021-05-30 NOTE — Progress Notes (Signed)
Patient ID: Tina Gibbs, female   DOB: 2000-06-01, 21 y.o.   MRN: [id number] ?[facility] Progress Note:   6 Days Post-Op  ?Subjective: ?Mental status is clear.  Complaints nausea and pain. ?Objective: ?Vital signs in last 24 hours: ?Temp:  [97.6 ?F (36.4 ?C)-98.6 ?F (37 ?C)] 98.3 ?F (36.8 ?C) (05/14 3474) ?Pulse Rate:  [72-93] 93 (05/14 0817) ?Resp:  [17-19] 17 (05/14 0817) ?BP: (103-121)/(58-82) 113/67 (05/14 2595) ?SpO2:  [97 %-100 %] 97 % (05/14 0817) ? ?Intake/Output from previous day: ?05/13 0701 - 05/14 0700 ?In: 300 [P.O.:300] ?Out: -  ?Intake/Output this shift: ?No intake/output data recorded. ? ?Physical Exam: Work of breathing is normal.  Still having right sided pain and some nausea  ? ?Lab Results:  ?Results for orders placed or performed during the hospital encounter of 05/24/21 (from the past 48 hour(s))  ?CBC     Status: Abnormal  ? Collection Time: 05/29/21  1:50 AM  ?Result Value Ref Range  ? WBC 16.9 (H) 4.0 - 10.5 K/uL  ? RBC 3.01 (L) 3.87 - 5.11 MIL/uL  ? Hemoglobin 9.8 (L) 12.0 - 15.0 g/dL  ? HCT 28.1 (L) 36.0 - 46.0 %  ? MCV 93.4 80.0 - 100.0 fL  ? MCH 32.6 26.0 - 34.0 pg  ? MCHC 34.9 30.0 - 36.0 g/dL  ? RDW 12.7 11.5 - 15.5 %  ? Platelets 279 150 - 400 K/uL  ? nRBC 0.0 0.0 - 0.2 %  ?  Comment: Performed at [facility], [place] [address], [place], [place] [address]  ? ? ?Radiology/Results: ?MR ABDOMEN WO CONTRAST ? ?Result Date: 05/29/2021 ?CLINICAL DATA:  Peritonitis suspected. Recent appendicitis and surgery. EXAM: MRI ABDOMEN WITHOUT CONTRAST TECHNIQUE: Multiplanar multisequence MR imaging was performed without the administration of intravenous contrast. COMPARISON:  MRI abdomen 05/24/2021 FINDINGS: Lower chest: Trace right pleural effusion. Hepatobiliary: Liver is normal in size and contour with no suspicious mass identified. Gallbladder appears within normal limits. No biliary ductal dilatation identified. Pancreas: No mass, inflammatory changes, or other parenchymal  abnormality identified. Spleen:  Within normal limits in size and appearance. Adrenals/Urinary Tract: Small cyst in the left kidney. No hydronephrosis. Adrenal glands appear normal. Stomach/Bowel: No evidence of bowel obstruction. Vascular/Lymphatic: No pathologically enlarged lymph nodes identified. No abdominal aortic aneurysm demonstrated. Other: Postsurgical changes of the right lateral abdominal wall and umbilical region are partially seen with soft tissue edema, more so on the right. Within the right lower quadrant of the abdomen posterior to the cecum, there is a partially visualized irregular hyperintense T2, hypointense T1 signal collection which demonstrates diffusion restriction and measures approximately 4.4 x 2.6 x 4.4 cm. Small amount of fluid tracking along the right pericolic gutter also noted which does not demonstrate diffusion restriction. Musculoskeletal: No suspicious bone lesions identified. IMPRESSION: 1. Partially visualized fluid collection in the right lower quadrant posterior to the cecum which demonstrates diffusion restriction and is highly suspicious for abscess. 2. Postsurgical changes of the anterior and right lateral abdominal wall with associated soft tissue edema, greater on the right. 3. Trace right pleural effusion. Electronically Signed   By: Ofilia Neas M.D.   On: 05/29/2021 19:48   ? ?Anti-infectives: ?Anti-infectives (From admission, onward)  ? ? Start     Dose/Rate Route Frequency Ordered Stop  ? 05/29/21 1715  fluconazole (DIFLUCAN) tablet 150 mg       ? 150 mg Oral  Once 05/29/21 1626 05/30/21 0039  ? 05/24/21 0900  metroNIDAZOLE (FLAGYL) IVPB  500 mg       ? 500 mg ?100 mL/hr over 60 Minutes Intravenous Every 12 hours 05/24/21 0806    ? 05/24/21 0830  cefTRIAXone (ROCEPHIN) 2 g in sodium chloride 0.9 % 100 mL IVPB       ? 2 g ?200 mL/hr over 30 Minutes Intravenous Every 24 hours 05/24/21 0806    ? ?  ? ? ?Assessment/Plan: ?Problem List: ?Patient Active Problem List   ? Diagnosis Date Noted  ? Acute perforated appendicitis 05/24/2021  ? Non-reassuring electronic fetal monitoring tracing 02/07/2019  ? Anemia in pregnancy 12/03/2018  ? UTI in pregnancy 08/22/2018  ? GBS (group B streptococcus) UTI complicating pregnancy 76/22/6333  ? Anxiety and depression 08/15/2018  ? Supervision of high risk pregnancy, antepartum 08/01/2018  ? Asthma   ? Influenza vaccination declined 03/06/2015  ? Unspecified asthma(493.90) 10/15/2012  ? Seasonal allergies 10/15/2012  ? ADHD (attention deficit hyperactivity disorder) 10/15/2012  ? ? ?Will ask IR to try to drain apparent "abscess" ?6 Days Post-Op  ? ? LOS: 6 days  ? ?Matt B. Hassell Done, MD, FACS ? ?[facility] ?[phone] to reach the surgeon on call.   ? ?05/30/2021 9:40 AM  ?
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
## 2021-05-31 ENCOUNTER — Inpatient Hospital Stay (HOSPITAL_COMMUNITY): Payer: Medicaid Other

## 2021-05-31 LAB — SURGICAL PCR SCREEN
MRSA, PCR: NEGATIVE
Staphylococcus aureus: NEGATIVE

## 2021-05-31 IMAGING — CT CT IMAGE GUIDED DRAINAGE BY PERCUTANEOUS CATHETER
1 of 2 series · 14 of 32 positions shown, 18 images · non-contrast
Comparison: MRI abdomen, [DATE].

INDICATION: Briefly, 20-year-old female 13 weeks gravid and post appendectomy
with RIGHT lower quadrant abscess.

[Series 2: i-spiral 5.0 b40f · axial · 0.76mm/px · z∈[+919,+1154]mm · 14 of 75 slices shown, 18 images]
[im 4/75  soft-tissue]
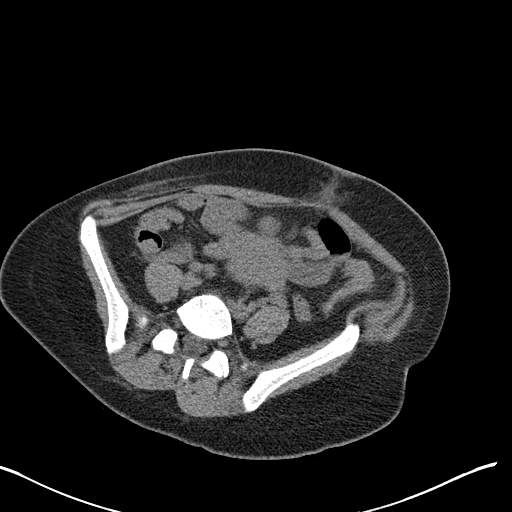
[im 4/75  bone]
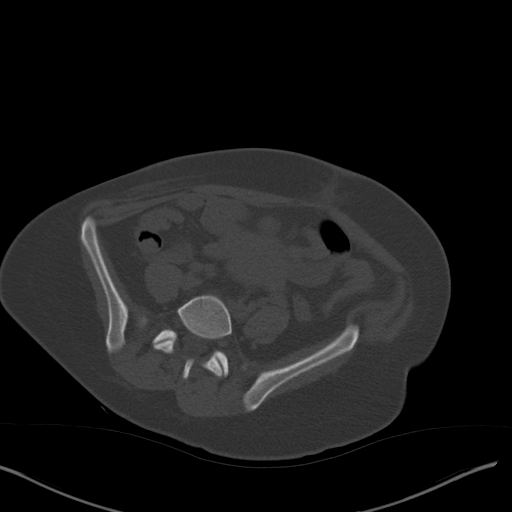
[im 10/75  soft-tissue]
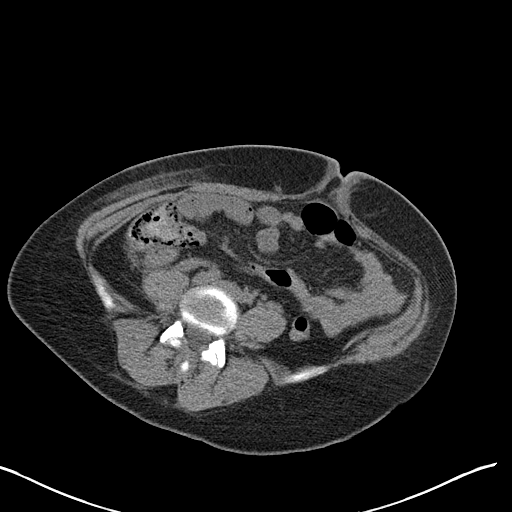
[im 16/75  soft-tissue]
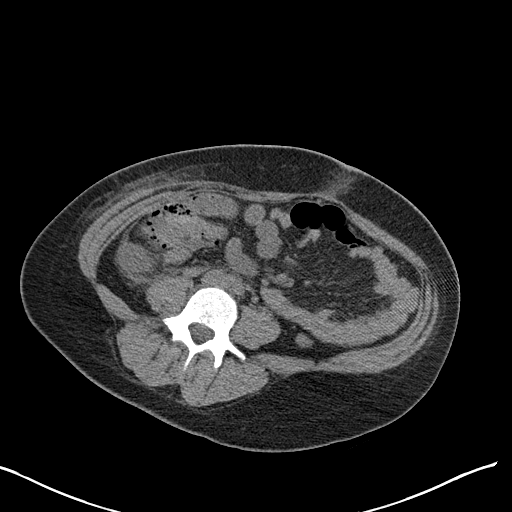
[im 22/75  soft-tissue]
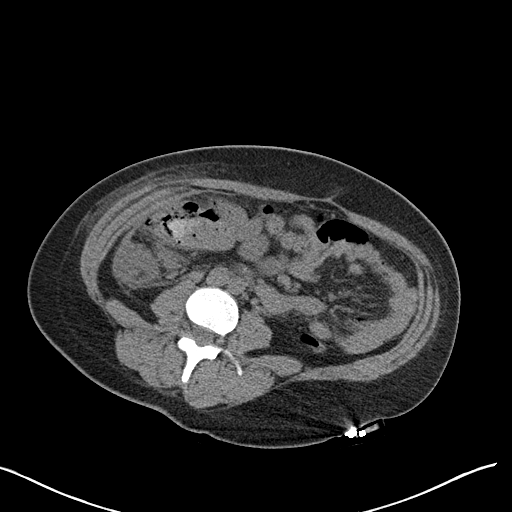
[im 28/75  soft-tissue]
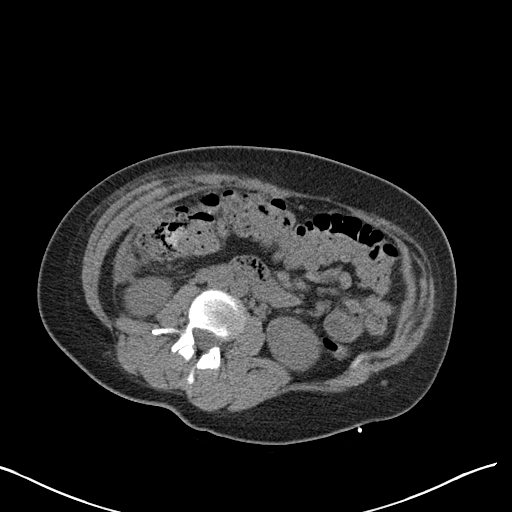
[im 34/75  soft-tissue]
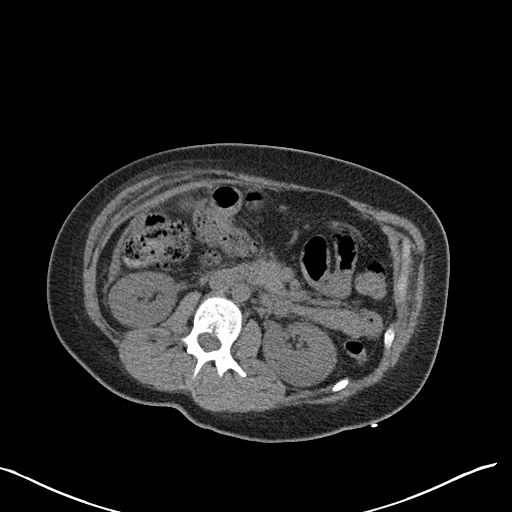
[im 41/75  soft-tissue]
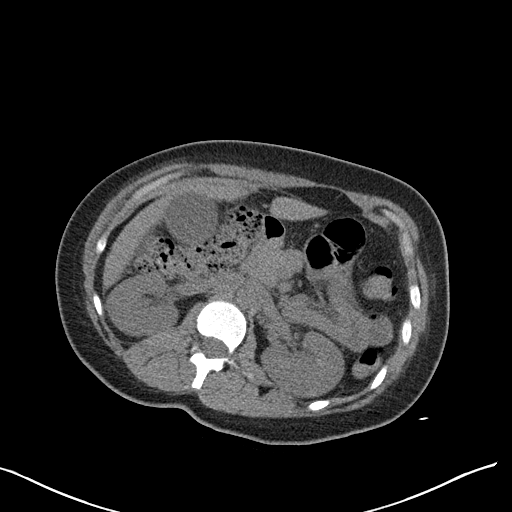
[im 47/75  soft-tissue]
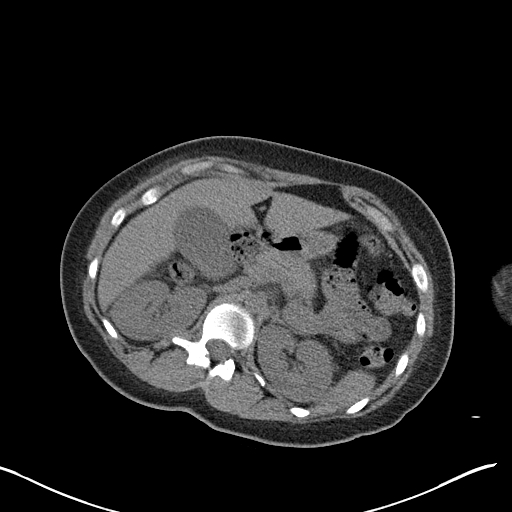
[im 53/75  soft-tissue]
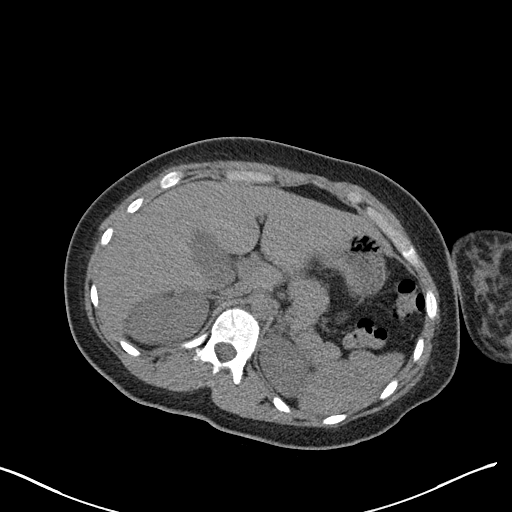
[im 53/75  bone]
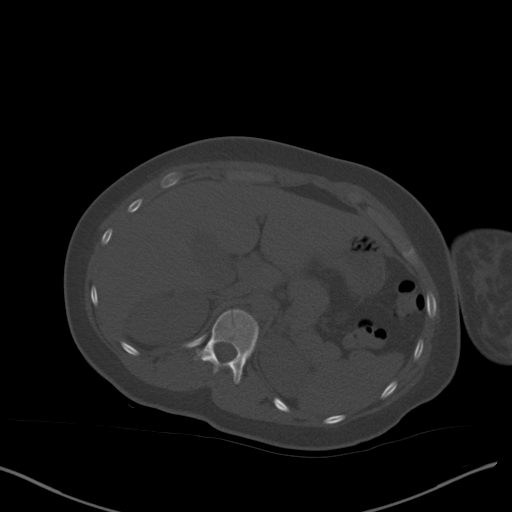
[im 59/75  soft-tissue]
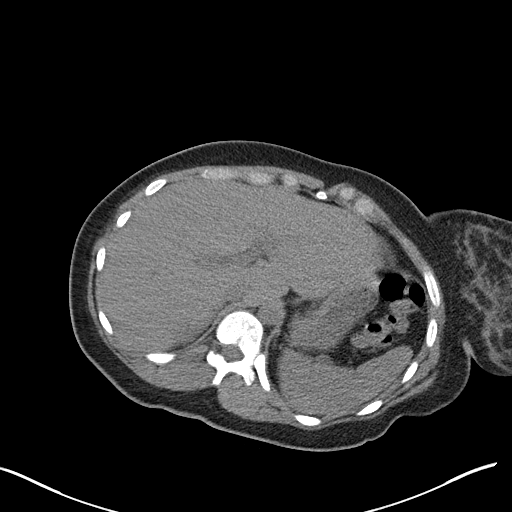
[im 62/75  lung]
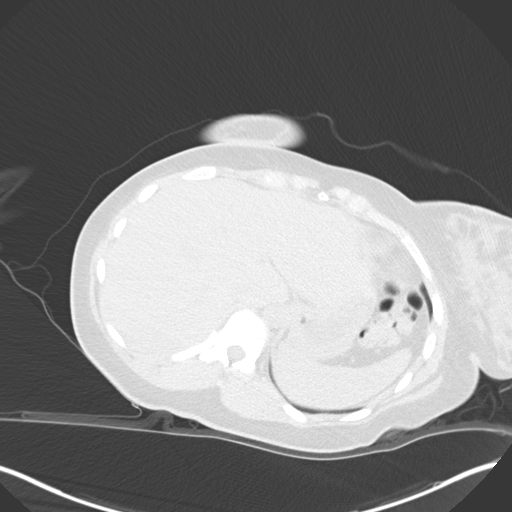
[im 65/75  soft-tissue]
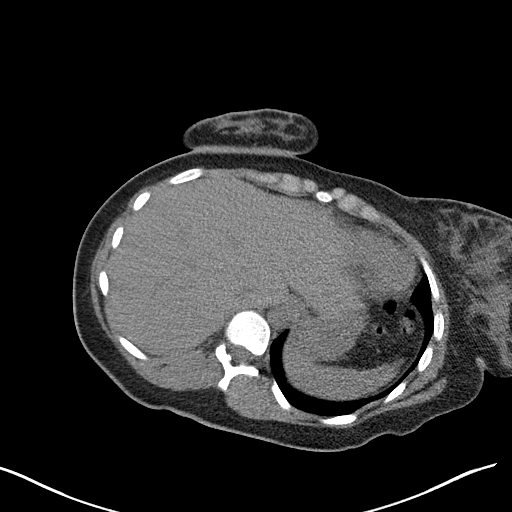
[im 65/75  lung]
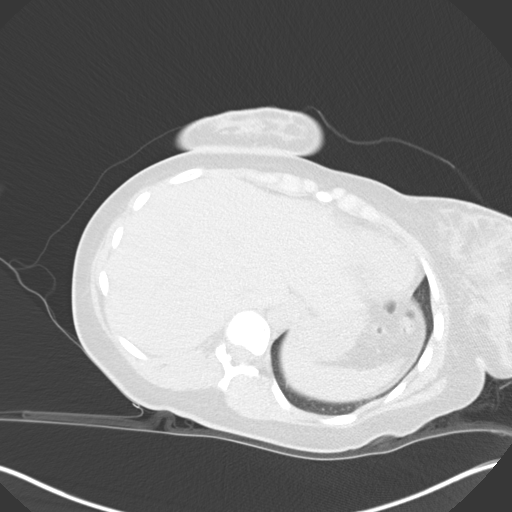
[im 68/75  lung]
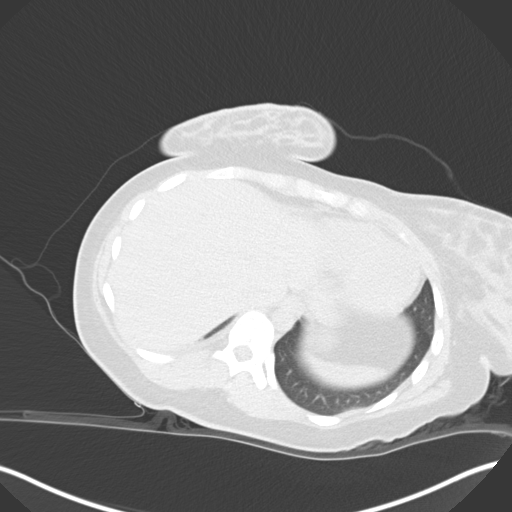
[im 71/75  soft-tissue]
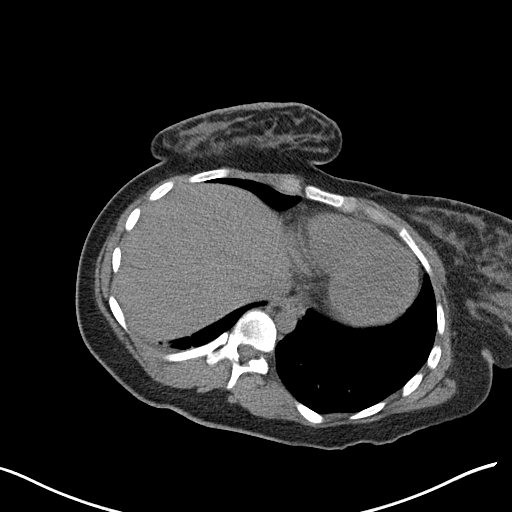
[im 71/75  lung]
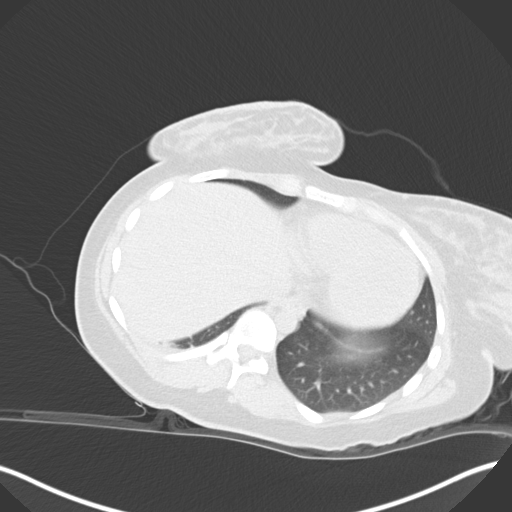

[14 of 32 positions shown; findings below may reference images not displayed]

EXAM:
CT GUIDED RIGHT LOWER QUADRANT ABSCESS DRAINAGE CATHETER PLACEMENT

RADIATION DOSE REDUCTION: This exam was performed according to the
departmental dose-optimization program which includes automated
exposure control, adjustment of the mA and/or kV according to
patient size and/or use of iterative reconstruction technique.
MEDICATIONS:
The patient is currently admitted to the hospital and receiving
intravenous antibiotics.

ANESTHESIA/SEDATION:
Moderate (conscious) sedation was employed during this procedure. A
total of Versed 1.5 mg and Fentanyl 175 mcg was administered
intravenously.

Moderate Sedation Time: 25 minutes. The patient's level of
consciousness and vital signs were monitored continuously by
radiology nursing throughout the procedure under my direct
supervision.

CONTRAST:  None

COMPLICATIONS:
None immediate.

PROCEDURE:
Informed written consent was obtained from the patient and/or
patient's representative after a discussion of the risks, benefits
and alternatives to treatment. The patient was placed supine on the
CT gantry and a pre procedural CT was performed re-demonstrating the
known abscess/fluid collection within the RIGHT lower quadrant. The
procedure was planned. A timeout was performed prior to the
initiation of the procedure.

The RIGHT lower quadrant was prepped and draped in the usual sterile
fashion. The overlying soft tissues were anesthetized with 1%
lidocaine with epinephrine. Appropriate trajectory was planned with
the use of a 22 gauge spinal needle. An 18 gauge trocar needle was
advanced into the abscess/fluid collection and a short Amplatz super
stiff wire was coiled within the collection. Appropriate positioning
was confirmed with a limited CT scan. The tract was serially dilated
allowing placement of a 10 Fr drainage catheter. Appropriate
positioning was confirmed with a limited postprocedural CT scan.

5 mL of purulent fluid was aspirated. The tube was connected to a
bulb suction and sutured in place. A dressing was placed. The
patient tolerated the procedure well without immediate post
procedural complication.
IMPRESSION: Successful CT guided placement of a 10 Fr drainage catheter into the
RIGHT lower quadrant abscess with aspiration of 5 mL of purulent
fluid.

Samples were sent to the laboratory as requested by the ordering
clinical team.

PLAN:
Routine drain care, including flushes. Drain maintenance instruction
for discharge.

clinic in 2 weeks for abscess drain evaluation and possible removal.

## 2021-05-31 MED ORDER — MIDAZOLAM HCL 2 MG/2ML IJ SOLN
INTRAMUSCULAR | Status: AC
  Filled 2021-05-31: qty 4

## 2021-05-31 MED ORDER — METOCLOPRAMIDE HCL 5 MG PO TABS
10.0000 mg | ORAL_TABLET | Freq: Four times a day (QID) | ORAL | Status: DC | PRN
  Administered 2021-05-31: 10 mg via ORAL
  Filled 2021-05-31: qty 2

## 2021-05-31 MED ORDER — LIDOCAINE HCL 1 % IJ SOLN
INTRAMUSCULAR | Status: AC
  Filled 2021-05-31: qty 10

## 2021-05-31 MED ORDER — FENTANYL CITRATE (PF) 100 MCG/2ML IJ SOLN
INTRAMUSCULAR | Status: AC
  Filled 2021-05-31: qty 4

## 2021-05-31 MED ORDER — MIDAZOLAM HCL 2 MG/2ML IJ SOLN
INTRAMUSCULAR | Status: AC | PRN
  Administered 2021-05-31: .5 mg via INTRAVENOUS
  Administered 2021-05-31: 1 mg via INTRAVENOUS

## 2021-05-31 MED ORDER — SODIUM CHLORIDE 0.9% FLUSH
5.0000 mL | Freq: Three times a day (TID) | INTRAVENOUS | Status: DC
  Administered 2021-05-31 – 2021-06-01 (×4): 5 mL

## 2021-05-31 MED ORDER — FENTANYL CITRATE (PF) 100 MCG/2ML IJ SOLN
INTRAMUSCULAR | Status: AC | PRN
  Administered 2021-05-31: 25 ug via INTRAVENOUS
  Administered 2021-05-31: 50 ug via INTRAVENOUS
  Administered 2021-05-31: 25 ug via INTRAVENOUS
  Administered 2021-05-31: 50 ug via INTRAVENOUS
  Administered 2021-05-31: 25 ug via INTRAVENOUS

## 2021-05-31 NOTE — Progress Notes (Signed)
Per pharmacy, ok to give fentanyl and versed at this stage of pregnancy. ?

## 2021-05-31 NOTE — Procedures (Signed)
Vascular [facility] Interventional Radiology Procedure Note ? ?Patient: Tina Gibbs ?DOB: [birth year] ?Medical Record Number: [id number] ?Note Date/Time: 05/31/21 10:43 AM  ? ?Performing Physician: Michaelle Birks, MD ?Assistant(s): None ? ?Diagnosis: Hx appendectomy w RLQ abscess ? ?Procedure: DRAINAGE CATHETER PLACEMENT at RLQ ? ?Anesthesia: Conscious Sedation ?Complications: None ?Estimated Blood Loss: Minimal ?Specimens: Sent for Gram Stain, Aerobe Culture, and Anerobe Culture ? ?Findings:  ?Successful CT-guided placement of 10 F drainage catheter into RLQ abscess. ? ?Plan:  ?- Flush drain with 5 mL Normal Saline every 8 hours. ?- Follow up drain evaluation / sinogram in 2 week(s). ? ?See detailed procedure note with images in PACS. ?The patient tolerated the procedure well without incident or complication and was returned to Floor Bed in stable condition.  ? ? ?Michaelle Birks, MD ?[facility] ?[facility] ? ? ?Pager. [phone] ?Clinic. [phone]  ?

## 2021-05-31 NOTE — Progress Notes (Signed)
? ? ?7 Days Post-Op  ?Subjective: ?CC: ?Still some right sided abdominal pain. Tolerating soft diet and doing well. Nausea in the morning that she thinks is her baseline nausea with pregnancy but no nausea related to po intake. No vomiting. Passing flatus. Formed bm yesterday. Voiding without dysuria or symptoms. No vaginal bleeding. Mobilizing.  ? ?Objective: ?Vital signs in last 24 hours: ?Temp:  [98 ?F (36.7 ?C)-98.5 ?F (36.9 ?C)] 98.2 ?F (36.8 ?C) (05/15 0418) ?Pulse Rate:  [84-98] 86 (05/15 0418) ?Resp:  [14-17] 16 (05/15 0418) ?BP: (106-114)/(59-71) 106/59 (05/15 0418) ?SpO2:  [97 %-100 %] 98 % (05/15 0418) ?Last BM Date : 05/30/21 ? ?Intake/Output from previous day: ?No intake/output data recorded. ?Intake/Output this shift: ?No intake/output data recorded. ? ?PE: ?Gen:  Alert, NAD, pleasant ?Card:  Reg ?Pulm:  CTAB, no W/R/R, effort normal ?Abd: Soft, gravid abdomen that appears ND, appropriately tender around laparoscopic incisions as well as some ttp on the right side of her abdomen but no peritonitis. +BS, laparoscopic incisions c/d/i ?Ext: No edema of BLE's ?Psych: A&Ox3  ? ?Lab Results:  ?Recent Labs  ?  05/29/21 ?0150  ?WBC 16.9*  ?HGB 9.8*  ?HCT 28.1*  ?PLT 279  ? ?BMET ?No results for input(s): NA, K, CL, CO2, GLUCOSE, BUN, CREATININE, CALCIUM in the last 72 hours. ?PT/INR ?No results for input(s): LABPROT, INR in the last 72 hours. ?CMP  ?   ?Component Value Date/Time  ? NA 136 05/28/2021 0459  ? K 3.8 05/28/2021 0459  ? CL 105 05/28/2021 0459  ? CO2 22 05/28/2021 0459  ? GLUCOSE 80 05/28/2021 0459  ? BUN 5 (L) 05/28/2021 0459  ? CREATININE 0.43 (L) 05/28/2021 0459  ? CALCIUM 8.2 (L) 05/28/2021 0459  ? PROT 6.4 (L) 05/24/2021 0039  ? ALBUMIN 3.2 (L) 05/24/2021 0039  ? AST 13 (L) 05/24/2021 0039  ? ALT 12 05/24/2021 0039  ? ALKPHOS 45 05/24/2021 0039  ? BILITOT 0.9 05/24/2021 0039  ? GFRNONAA >60 05/28/2021 0459  ? GFRAA >60 02/07/2019 1830  ? ?Lipase  ?   ?Component Value Date/Time  ? LIPASE 23  05/24/2021 0039  ? ? ?Studies/Results: ?MR ABDOMEN WO CONTRAST ? ?Result Date: 05/29/2021 ?CLINICAL DATA:  Peritonitis suspected. Recent appendicitis and surgery. EXAM: MRI ABDOMEN WITHOUT CONTRAST TECHNIQUE: Multiplanar multisequence MR imaging was performed without the administration of intravenous contrast. COMPARISON:  MRI abdomen 05/24/2021 FINDINGS: Lower chest: Trace right pleural effusion. Hepatobiliary: Liver is normal in size and contour with no suspicious mass identified. Gallbladder appears within normal limits. No biliary ductal dilatation identified. Pancreas: No mass, inflammatory changes, or other parenchymal abnormality identified. Spleen:  Within normal limits in size and appearance. Adrenals/Urinary Tract: Small cyst in the left kidney. No hydronephrosis. Adrenal glands appear normal. Stomach/Bowel: No evidence of bowel obstruction. Vascular/Lymphatic: No pathologically enlarged lymph nodes identified. No abdominal aortic aneurysm demonstrated. Other: Postsurgical changes of the right lateral abdominal wall and umbilical region are partially seen with soft tissue edema, more so on the right. Within the right lower quadrant of the abdomen posterior to the cecum, there is a partially visualized irregular hyperintense T2, hypointense T1 signal collection which demonstrates diffusion restriction and measures approximately 4.4 x 2.6 x 4.4 cm. Small amount of fluid tracking along the right pericolic gutter also noted which does not demonstrate diffusion restriction. Musculoskeletal: No suspicious bone lesions identified. IMPRESSION: 1. Partially visualized fluid collection in the right lower quadrant posterior to the cecum which demonstrates diffusion restriction and is highly suspicious for  abscess. 2. Postsurgical changes of the anterior and right lateral abdominal wall with associated soft tissue edema, greater on the right. 3. Trace right pleural effusion. Electronically Signed   By: Ofilia Neas M.D.   On: 05/29/2021 19:48   ? ?Anti-infectives: ?Anti-infectives (From admission, onward)  ? ? Start     Dose/Rate Route Frequency Ordered Stop  ? 05/29/21 1715  fluconazole (DIFLUCAN) tablet 150 mg       ? 150 mg Oral  Once 05/29/21 1626 05/30/21 0039  ? 05/24/21 0900  metroNIDAZOLE (FLAGYL) IVPB 500 mg       ? 500 mg ?100 mL/hr over 60 Minutes Intravenous Every 12 hours 05/24/21 0806    ? 05/24/21 0830  cefTRIAXone (ROCEPHIN) 2 g in sodium chloride 0.9 % 100 mL IVPB       ? 2 g ?200 mL/hr over 30 Minutes Intravenous Every 24 hours 05/24/21 0806    ? ?  ? ? ? ?Assessment/Plan ?POD 7 s/p Laparoscopic Appendectomy for acute gangrenous appendicitis with generalized peritonitis - Dr. Brantley Stage, 05/24/21 ?- MRI 5/13 w/ RLQ fluid collection suspicious for abscess.  ?- IR planning for drainage today ?- Cont abx ?- Path: Gangrenous appendicitis with perforation ?- Mobilize ?- Pulm toilet.  ?[redacted] weeks Pregnant - OB followed post op and has now signed off. Cont prenatal multi. Plans to see [facility] after d/c.  ?ABL anemia - Stable on labs 5/13.  ?  ?FEN - NPO for procedure. Can resume soft diet post op ?VTE - SCDs, Lovenox ?ID - Rocephin/Flagyl 5/8 >>  ?Foley - None. Voiding. Good UOP. UCx never sent. Symptoms resolved. ? ? LOS: 7 days  ? ? ?Jillyn Ledger , PA-C ?[facility] ?05/31/2021, 8:12 AM ?Please see Amion for pager number during day hours 7:00am-4:30pm ? ?

## 2021-06-01 ENCOUNTER — Other Ambulatory Visit (HOSPITAL_COMMUNITY): Payer: Self-pay

## 2021-06-01 LAB — CBC
HCT: 30.5 % — ABNORMAL LOW (ref 36.0–46.0)
Hemoglobin: 10.4 g/dL — ABNORMAL LOW (ref 12.0–15.0)
MCH: 32.4 pg (ref 26.0–34.0)
MCHC: 34.1 g/dL (ref 30.0–36.0)
MCV: 95 fL (ref 80.0–100.0)
Platelets: 317 10*3/uL (ref 150–400)
RBC: 3.21 MIL/uL — ABNORMAL LOW (ref 3.87–5.11)
RDW: 12.6 % (ref 11.5–15.5)
WBC: 13.8 10*3/uL — ABNORMAL HIGH (ref 4.0–10.5)
nRBC: 0 % (ref 0.0–0.2)

## 2021-06-01 LAB — BASIC METABOLIC PANEL
Anion gap: 7 (ref 5–15)
BUN: <5 mg/dL — ABNORMAL LOW (ref 6–20)
CO2: 23 mmol/L (ref 22–32)
Calcium: 8.6 mg/dL — ABNORMAL LOW (ref 8.9–10.3)
Chloride: 105 mmol/L (ref 98–111)
Creatinine, Ser: 0.42 mg/dL — ABNORMAL LOW (ref 0.44–1.00)
GFR, Estimated: >60 mL/min (ref >60)
Glucose, Bld: 81 mg/dL (ref 70–99)
Potassium: 3.9 mmol/L (ref 3.5–5.1)
Sodium: 135 mmol/L (ref 135–145)

## 2021-06-01 MED ORDER — OXYCODONE HCL 5 MG PO TABS: 5.0000 mg | ORAL_TABLET | ORAL | 0 refills | Status: DC | PRN

## 2021-06-01 MED ORDER — NORMAL SALINE FLUSH 0.9 % IV SOLN
5.0000 mL | Freq: Every day | INTRAVENOUS | 1 refills | Status: DC
  Filled 2021-06-01: qty 500, 50d supply, fill #0

## 2021-06-01 MED ORDER — AMOXICILLIN-POT CLAVULANATE 875-125 MG PO TABS: 1.0000 | ORAL_TABLET | Freq: Two times a day (BID) | ORAL | 0 refills | Status: AC

## 2021-06-01 MED ORDER — ACETAMINOPHEN 325 MG PO TABS: 650.0000 mg | ORAL_TABLET | ORAL | Status: DC | PRN

## 2021-06-01 NOTE — Discharge Summary (Signed)
? ? ?Patient ID: ?Tina Gibbs ?[id number] ?22-Dec-2000 21 y.o. ? ?Admit date: 05/24/2021 ?Discharge date: 06/01/2021 ? ?Admitting Diagnosis: ?Acute appendicitis with possible perforation ?Pregnancy - [redacted]w[redacted]d ? ?Discharge Diagnosis ?Patient Active Problem List  ? Diagnosis Date Noted  ? Acute perforated appendicitis 05/24/2021  ? Non-reassuring electronic fetal monitoring tracing 02/07/2019  ? Anemia in pregnancy 12/03/2018  ? UTI in pregnancy 08/22/2018  ? GBS (group B streptococcus) UTI complicating pregnancy 048/54/6270 ? Anxiety and depression 08/15/2018  ? Supervision of high risk pregnancy, antepartum 08/01/2018  ? Asthma   ? Influenza vaccination declined 03/06/2015  ? Unspecified asthma(493.90) 10/15/2012  ? Seasonal allergies 10/15/2012  ? ADHD (attention deficit hyperactivity disorder) 10/15/2012  ?POD 8 s/p Laparoscopic Appendectomy for acute gangrenous appendicitis with generalized peritonitis - Dr. CBrantley Stage 05/24/21 ?[redacted] weeks Pregnant  ?ABL anemia ? ?Consultants ?OB ? ?Reason for Admission: ?Tina CUMMISKEYis a 21y.o. G3P1011 at 111w2demale who presented to MAU for abdominal pain.  Patient was seen in MAU on 5/5 and 5/6 for the same complaint.  Reports on 5/4 PM she began having right-sided abdominal pain with associated nausea and vomiting that was unrelieved with Reglan.  Work-up between 5/5 and 5/6 showed WBC 16, Lipase wnl, LFT's wnl, UCx with less than 10,000 colonies (insignificant growth), negative RUQ [place] gallstones, cbd wnl), neg renal [place] abdomen without any significant findings.  Patient was discharged home.  She reports continued pain on the right side greatest in the right mid abdomen that significantly worsened last night.  Associated anorexia, diaphoresis and fever of 101 at home last night. Continued nausea controlled w/ zofran. No further emesis in the last 24 hours. Having some burning with urination and notes that her urine seems more concentrated.  Having some mild, white  vaginal discharge that she states is normal for her. No new vaginal discharge or vaginal bleeding.  No chest pain or shortness of breath.  Work-up today with FHT 142, WBC 16.7, Lipase wnl, LFT's non-elevated, K 2.8, MRI abdomen & pelvis w/ acute appendicitis with concern for appendiceal perforation with multiple appendicoliths, 1 that appears outside the lumen of the appendix with possible early loculated fluid collection measuring approximately 3 cc.  She has been started on Rocephin/Flagyl.  We are asked to see.  No prior abdominal surgeries.  Takes prenatal vitamin daily, no other medications.  No blood thinners.  Reports tobacco use prior to pregnancy but has since quit.  No alcohol use.  Marijuana use, no other illicit drug use.  Lives at home with her fianc?. Marland KitchenShe has been n.p.o. since midnight. She has not establish care with an OB but plans to see [facility]. ? ?Procedures ?Lap appy, Dr. CoBrantley Stagen 05/24/21 ? ?IR drain placement into intra-abdominal abscess, 05/31/21 ? ?Hospital Course:  ?The patient was admitted and underwent the above lap appy procedure.  She was found to have acute gangrenous and perforated appendicitis.  She was on IV abx therapy during her stay.  Post operatively, the OB's followed her for several days with good heart tones during checks.  Otherwise, she developed an ileus that took several days to resolve.  Her WBC began to trend up and she ultimately got an MRI around POD 6.  This revealed evidence of a pelvic abscess.  She had a drain placed by IR on 5/15.  The patient felt much better and had improved enough the following day that she was stable for DC home with her drain  in place.  She was converted to oral augmentin for 4 more days at discharge.  She will follow up with Dr. Brantley Stage as well as the IR drain clinic.   ? ?I was not involved in the patient's care and history was obtained from the chart. ? ?Allergies as of 06/01/2021   ? ?   Reactions  ? Latex Hives, Itching  ?  Peanut-containing Drug Products Itching  ? ?  ? ?  ?Medication List  ?  ? ?TAKE these medications   ? ?acetaminophen 325 MG tablet ?Commonly known as: TYLENOL ?Take 2 tablets (650 mg total) by mouth every 4 (four) hours as needed for mild pain (or temp > 100). ?  ?amoxicillin-clavulanate 875-125 MG tablet ?Commonly known as: Augmentin ?Take 1 tablet by mouth 2 (two) times daily for 4 days. ?  ?famotidine 20 MG tablet ?Commonly known as: Pepcid ?Take 1 tablet (20 mg total) by mouth daily. ?  ?metoCLOPramide 10 MG tablet ?Commonly known as: REGLAN ?Take 1 tablet (10 mg total) by mouth 3 (three) times daily with meals. ?  ?ondansetron 8 MG disintegrating tablet ?Commonly known as: Zofran ODT ?Take 1 tablet (8 mg total) by mouth every 8 (eight) hours as needed for nausea or vomiting. ?  ?oxyCODONE 5 MG immediate release tablet ?Commonly known as: Oxy IR/ROXICODONE ?Take 1 tablet (5 mg total) by mouth every 4 (four) hours as needed for moderate pain. ?  ?Prenatabs Rx 29-1 MG Tabs ?Take 1 tablet by mouth daily. ?  ? ?  ? ? ? ? Follow-up Information   ? ? Erroll Luna, MD Follow up on 06/18/2021.   ?Specialty: General Surgery ?Why: arrive at 11:00 for 11:30am appointment time for paperwork and check in process. ?Contact information: ?[address] ?[address] ?[place] [address] ?[phone] ? ? ?  ?  ? ? Mugweru, Jon, MD Follow up in 1 week(s).   ?Specialties: Interventional Radiology, Diagnostic Radiology, Radiology ?Contact information: ?[address] ?[address] ?[place] [address] ?[phone] ? ? ?  ?  ? ?  ?  ? ?  ? ? ?Signed: ?Saverio Danker, PA-C ?[facility] ?06/01/2021, 11:22 AM ?Please see Amion for pager number during day hours 7:00am-4:30pm, 7-11:30am on Weekends ? ? ?

## 2021-06-01 NOTE — Progress Notes (Signed)
Patient educated about drain flushing and changing the dressing. Pt was able to return demonstration for both.   ?

## 2021-06-01 NOTE — Progress Notes (Signed)
? ? ?8 Days Post-Op  ?Subjective: ?CC: ?Some soreness around drain but pain overall improve after drain placement. Vomited after IR procedure yesterday but tolerating diet and doing well since (had [employer] for dinner). Still having nausea in the morning that she thinks is her baseline nausea with pregnancy but no nausea related to po intake. No vomiting. Passing flatus. Formed bm yesterday am. Voiding without dysuria or symptoms. No vaginal bleeding. Mobilizing. Going to walk in the halls today.  ? ?Objective: ?Vital signs in last 24 hours: ?Temp:  [98.3 ?F (36.8 ?C)-98.5 ?F (36.9 ?C)] 98.3 ?F (36.8 ?C) (05/16 9528) ?Pulse Rate:  [72-111] 97 (05/16 0412) ?Resp:  [14-19] 16 (05/16 0412) ?BP: (110-129)/(64-87) 113/64 (05/16 4132) ?SpO2:  [97 %-100 %] 98 % (05/16 0412) ?Last BM Date : 05/31/21 ? ?Intake/Output from previous day: ?05/15 0701 - 05/16 0700 ?In: 7020.1 [I.V.:4920.1; IV Piggyback:2100] ?Out: 23 [Drains:23] ?Intake/Output this shift: ?No intake/output data recorded. ? ?PE: ?Gen:  Alert, NAD, pleasant ?Card:  Reg ?Pulm:  CTAB, no W/R/R, effort normal ?Abd: Soft, gravid abdomen that appears ND, appropriately tender around laparoscopic incisions as well as some ttp on the right side of her abdomen around drain but no peritonitis. No other areas of pain. +BS, laparoscopic incisions c/d/I. IR drain SS.  ?Ext: No edema of BLE's ?Psych: A&Ox3  ? ?Lab Results:  ?Recent Labs  ?  06/01/21 ?0105  ?WBC 13.8*  ?HGB 10.4*  ?HCT 30.5*  ?PLT 317  ? ?BMET ?Recent Labs  ?  06/01/21 ?0105  ?NA 135  ?K 3.9  ?CL 105  ?CO2 23  ?GLUCOSE 81  ?BUN <5*  ?CREATININE 0.42*  ?CALCIUM 8.6*  ? ?PT/INR ?No results for input(s): LABPROT, INR in the last 72 hours. ?CMP  ?   ?Component Value Date/Time  ? NA 135 06/01/2021 0105  ? K 3.9 06/01/2021 0105  ? CL 105 06/01/2021 0105  ? CO2 23 06/01/2021 0105  ? GLUCOSE 81 06/01/2021 0105  ? BUN <5 (L) 06/01/2021 0105  ? CREATININE 0.42 (L) 06/01/2021 0105  ? CALCIUM 8.6 (L) 06/01/2021 0105  ? PROT  6.4 (L) 05/24/2021 0039  ? ALBUMIN 3.2 (L) 05/24/2021 0039  ? AST 13 (L) 05/24/2021 0039  ? ALT 12 05/24/2021 0039  ? ALKPHOS 45 05/24/2021 0039  ? BILITOT 0.9 05/24/2021 0039  ? GFRNONAA >60 06/01/2021 0105  ? GFRAA >60 02/07/2019 1830  ? ?Lipase  ?   ?Component Value Date/Time  ? LIPASE 23 05/24/2021 0039  ? ? ?Studies/Results: ?CT IMAGE GUIDED DRAINAGE BY PERCUTANEOUS CATHETER ? ?Result Date: 05/31/2021 ?INDICATION: Briefly, 21 year old female 13 weeks gravid and post appendectomy with RIGHT lower quadrant abscess. EXAM: CT GUIDED RIGHT LOWER QUADRANT ABSCESS DRAINAGE CATHETER PLACEMENT RADIATION DOSE REDUCTION: This exam was performed according to the departmental dose-optimization program which includes automated exposure control, adjustment of the mA and/or kV according to patient size and/or use of iterative reconstruction technique. COMPARISON:  MRI abdomen, 05/29/2021. MEDICATIONS: The patient is currently admitted to the hospital and receiving intravenous antibiotics. ANESTHESIA/SEDATION: Moderate (conscious) sedation was employed during this procedure. A total of Versed 1.5 mg and Fentanyl 175 mcg was administered intravenously. Moderate Sedation Time: 25 minutes. The patient's level of consciousness and vital signs were monitored continuously by radiology nursing throughout the procedure under my direct supervision. CONTRAST:  None COMPLICATIONS: None immediate. PROCEDURE: Informed written consent was obtained from the patient and/or patient's representative after a discussion of the risks, benefits and alternatives to treatment. The patient was placed  supine on the CT gantry and a pre procedural CT was performed re-demonstrating the known abscess/fluid collection within the RIGHT lower quadrant. The procedure was planned. A timeout was performed prior to the initiation of the procedure. The RIGHT lower quadrant was prepped and draped in the usual sterile fashion. The overlying soft tissues were  anesthetized with 1% lidocaine with epinephrine. Appropriate trajectory was planned with the use of a 22 gauge spinal needle. An 18 gauge trocar needle was advanced into the abscess/fluid collection and a short Amplatz super stiff wire was coiled within the collection. Appropriate positioning was confirmed with a limited CT scan. The tract was serially dilated allowing placement of a 10 Fr drainage catheter. Appropriate positioning was confirmed with a limited postprocedural CT scan. 5 mL of purulent fluid was aspirated. The tube was connected to a bulb suction and sutured in place. A dressing was placed. The patient tolerated the procedure well without immediate post procedural complication. IMPRESSION: Successful CT guided placement of a 10 Fr drainage catheter into the RIGHT lower quadrant abscess with aspiration of 5 mL of purulent fluid. Samples were sent to the laboratory as requested by the ordering clinical team. PLAN: Routine drain care, including flushes. Drain maintenance instruction for discharge. Patient will follow-up in Vascular Interventional Radiology (VIR) clinic in 2 weeks for abscess drain evaluation and possible removal. Michaelle Birks, MD [facility] Electronically Signed   By: Michaelle Birks M.D.   On: 05/31/2021 11:50   ? ?Anti-infectives: ?Anti-infectives (From admission, onward)  ? ? Start     Dose/Rate Route Frequency Ordered Stop  ? 05/29/21 1715  fluconazole (DIFLUCAN) tablet 150 mg       ? 150 mg Oral  Once 05/29/21 1626 05/30/21 0039  ? 05/24/21 0900  metroNIDAZOLE (FLAGYL) IVPB 500 mg       ? 500 mg ?100 mL/hr over 60 Minutes Intravenous Every 12 hours 05/24/21 0806    ? 05/24/21 0830  cefTRIAXone (ROCEPHIN) 2 g in sodium chloride 0.9 % 100 mL IVPB       ? 2 g ?200 mL/hr over 30 Minutes Intravenous Every 24 hours 05/24/21 0806    ? ?  ? ? ? ?Assessment/Plan ?POD 8 s/p Laparoscopic Appendectomy for acute gangrenous appendicitis with  generalized peritonitis - Dr. Brantley Stage, 05/24/21 ?- MRI 5/13 w/ RLQ fluid collection suspicious for abscess. S/p IR drainage 5/15 w/ purulent fluid aspirated. Cx pending.  ?- Cont abx. Likely 5 days after course control (IR drain placement)  ?- Path: Gangrenous appendicitis with perforation ?- Mobilize ?- Pulm toilet.  ?[redacted] weeks Pregnant - OB followed post op and has now signed off. Cont prenatal multi. Plans to see [facility] after d/c.  ?ABL anemia - Stable  ?  ?FEN - Soft diet.  ?VTE - SCDs, Lovenox ?ID - Rocephin/Flagyl 5/8 >>  ?Foley - None. Voiding. Good UOP. UCx never sent. Symptoms resolved ?Dispo - Hopefully home within the next 24 hours. ? ? LOS: 8 days  ? ? ?Jillyn Ledger , PA-C ?[facility] ?06/01/2021, 8:30 AM ?Please see Amion for pager number during day hours 7:00am-4:30pm ? ?

## 2021-06-01 NOTE — Progress Notes (Signed)
? ? ?Supervising Physician: Juliet Rude ? ?Patient Status:  [facility] - In-pt ? ?Chief Complaint: ? ?Acute perforated appendicitis, now 1 day s/p drain placement ? ?Subjective: ? ?Feeling much better, reports a little sore at drain site, has resumed diet ? ?Allergies: ?Latex and Peanut-containing drug products ? ?Medications: ?Prior to Admission medications   ?Medication Sig Start Date End Date Taking? Authorizing Provider  ?amoxicillin-clavulanate (AUGMENTIN) 875-125 MG tablet Take 1 tablet by mouth 2 (two) times daily for 4 days. 06/01/21 06/05/21 Yes Saverio Danker, PA-C  ?famotidine (PEPCID) 20 MG tablet Take 1 tablet (20 mg total) by mouth daily. 04/11/21 04/11/22 Yes Nugent, Gerrie Nordmann, NP  ?metoCLOPramide (REGLAN) 10 MG tablet Take 1 tablet (10 mg total) by mouth 3 (three) times daily with meals. 04/11/21 04/11/22 Yes Nugent, Gerrie Nordmann, NP  ?ondansetron (ZOFRAN ODT) 8 MG disintegrating tablet Take 1 tablet (8 mg total) by mouth every 8 (eight) hours as needed for nausea or vomiting. 05/21/21  Yes Rasch, Artist Pais, NP  ?Prenatal Vit-Iron Carbonyl-FA (PRENATABS RX) 29-1 MG TABS Take 1 tablet by mouth daily. 04/11/21  Yes Nugent, Gerrie Nordmann, NP  ?acetaminophen (TYLENOL) 325 MG tablet Take 2 tablets (650 mg total) by mouth every 4 (four) hours as needed for mild pain (or temp > 100). 06/01/21   Saverio Danker, PA-C  ?oxyCODONE (OXY IR/ROXICODONE) 5 MG immediate release tablet Take 1 tablet (5 mg total) by mouth every 4 (four) hours as needed for moderate pain. 06/01/21   Saverio Danker, PA-C  ? ? ? ?Vital Signs: ?BP (!) 102/57 (BP Location: Right Arm)   Pulse 95   Temp 98.2 ?F (36.8 ?C) (Oral)   Resp 16   Ht '5\' 1"'$  (1.549 m)   Wt 138 lb (62.6 kg)   LMP 02/08/2021 (Exact Date)   SpO2 98%   BMI 26.07 kg/m?  ? ?Physical Exam ?Vitals reviewed.  ?Constitutional:   ?   General: She is not in acute distress. ?   Appearance: She is not ill-appearing.  ?HENT:  ?   Head: Normocephalic and atraumatic.  ?   Mouth/Throat:  ?    Pharynx: Oropharynx is clear.  ?Eyes:  ?   Pupils: Pupils are equal, round, and reactive to light.  ?Cardiovascular:  ?   Rate and Rhythm: Normal rate.  ?Pulmonary:  ?   Effort: Pulmonary effort is normal. No respiratory distress.  ?Abdominal:  ?   Palpations: Abdomen is soft.  ?   Tenderness: There is abdominal tenderness in the right lower quadrant.  ?Neurological:  ?   General: No focal deficit present.  ?   Mental Status: She is alert and oriented to person, place, and time.  ?Psychiatric:     ?   Mood and Affect: Mood normal.     ?   Behavior: Behavior normal.  ? ?Drain Location: RLQ ?Size: Fr size: 10 Fr ?Date of placement: 05/31/21 ?Currently to: Drain collection device: suction bulb ?24 hour output:  ?Output by Drain (mL) 05/30/21 0700 - 05/30/21 1459 05/30/21 1500 - 05/30/21 2259 05/30/21 2300 - 05/31/21 0659 05/31/21 0700 - 05/31/21 1459 05/31/21 1500 - 05/31/21 2259 05/31/21 2300 - 06/01/21 0659 06/01/21 0700 - 06/01/21 1146  ?Closed System Drain 1 Right RLQ Bulb (JP) 10 Fr.    '3 15 5   '$ ? ? ?Current examination: ?Flushes/aspirates easily.  ?Insertion site unremarkable. ?Suture and stat lock in place. ?Dressed appropriately.  ?5-10 cc serosanguinous OP in bulb at exam ? ?Imaging: ?MR ABDOMEN WO  CONTRAST ? ?Result Date: 05/29/2021 ?CLINICAL DATA:  Peritonitis suspected. Recent appendicitis and surgery. EXAM: MRI ABDOMEN WITHOUT CONTRAST TECHNIQUE: Multiplanar multisequence MR imaging was performed without the administration of intravenous contrast. COMPARISON:  MRI abdomen 05/24/2021 FINDINGS: Lower chest: Trace right pleural effusion. Hepatobiliary: Liver is normal in size and contour with no suspicious mass identified. Gallbladder appears within normal limits. No biliary ductal dilatation identified. Pancreas: No mass, inflammatory changes, or other parenchymal abnormality identified. Spleen:  Within normal limits in size and appearance. Adrenals/Urinary Tract: Small cyst in the left kidney. No  hydronephrosis. Adrenal glands appear normal. Stomach/Bowel: No evidence of bowel obstruction. Vascular/Lymphatic: No pathologically enlarged lymph nodes identified. No abdominal aortic aneurysm demonstrated. Other: Postsurgical changes of the right lateral abdominal wall and umbilical region are partially seen with soft tissue edema, more so on the right. Within the right lower quadrant of the abdomen posterior to the cecum, there is a partially visualized irregular hyperintense T2, hypointense T1 signal collection which demonstrates diffusion restriction and measures approximately 4.4 x 2.6 x 4.4 cm. Small amount of fluid tracking along the right pericolic gutter also noted which does not demonstrate diffusion restriction. Musculoskeletal: No suspicious bone lesions identified. IMPRESSION: 1. Partially visualized fluid collection in the right lower quadrant posterior to the cecum which demonstrates diffusion restriction and is highly suspicious for abscess. 2. Postsurgical changes of the anterior and right lateral abdominal wall with associated soft tissue edema, greater on the right. 3. Trace right pleural effusion. Electronically Signed   By: Ofilia Neas M.D.   On: 05/29/2021 19:48  ? ?CT IMAGE GUIDED DRAINAGE BY PERCUTANEOUS CATHETER ? ?Result Date: 05/31/2021 ?INDICATION: Briefly, 21 year old female 13 weeks gravid and post appendectomy with RIGHT lower quadrant abscess. EXAM: CT GUIDED RIGHT LOWER QUADRANT ABSCESS DRAINAGE CATHETER PLACEMENT RADIATION DOSE REDUCTION: This exam was performed according to the departmental dose-optimization program which includes automated exposure control, adjustment of the mA and/or kV according to patient size and/or use of iterative reconstruction technique. COMPARISON:  MRI abdomen, 05/29/2021. MEDICATIONS: The patient is currently admitted to the hospital and receiving intravenous antibiotics. ANESTHESIA/SEDATION: Moderate (conscious) sedation was employed during this  procedure. A total of Versed 1.5 mg and Fentanyl 175 mcg was administered intravenously. Moderate Sedation Time: 25 minutes. The patient's level of consciousness and vital signs were monitored continuously by radiology nursing throughout the procedure under my direct supervision. CONTRAST:  None COMPLICATIONS: None immediate. PROCEDURE: Informed written consent was obtained from the patient and/or patient's representative after a discussion of the risks, benefits and alternatives to treatment. The patient was placed supine on the CT gantry and a pre procedural CT was performed re-demonstrating the known abscess/fluid collection within the RIGHT lower quadrant. The procedure was planned. A timeout was performed prior to the initiation of the procedure. The RIGHT lower quadrant was prepped and draped in the usual sterile fashion. The overlying soft tissues were anesthetized with 1% lidocaine with epinephrine. Appropriate trajectory was planned with the use of a 22 gauge spinal needle. An 18 gauge trocar needle was advanced into the abscess/fluid collection and a short Amplatz super stiff wire was coiled within the collection. Appropriate positioning was confirmed with a limited CT scan. The tract was serially dilated allowing placement of a 10 Fr drainage catheter. Appropriate positioning was confirmed with a limited postprocedural CT scan. 5 mL of purulent fluid was aspirated. The tube was connected to a bulb suction and sutured in place. A dressing was placed. The patient tolerated the procedure well without immediate  post procedural complication. IMPRESSION: Successful CT guided placement of a 10 Fr drainage catheter into the RIGHT lower quadrant abscess with aspiration of 5 mL of purulent fluid. Samples were sent to the laboratory as requested by the ordering clinical team. PLAN: Routine drain care, including flushes. Drain maintenance instruction for discharge. Patient will follow-up in Vascular Interventional  Radiology (VIR) clinic in 2 weeks for abscess drain evaluation and possible removal. Michaelle Birks, MD [facility] Radiology Electronically Signed   By: Michaelle Birks M.D.   On: 05/

## 2021-06-01 NOTE — Plan of Care (Signed)
Pt understanding of discharge instructions  

## 2021-06-02 ENCOUNTER — Telehealth: Payer: Self-pay | Admitting: Internal Medicine

## 2021-06-02 NOTE — Telephone Encounter (Signed)
PC placed to pt today after I received prelim results of culture from pelvic abscess.  Growing rare Pseudomonas most sensitive to Cipro.  Pt [redacted] wks pregnant and recently hosp for rupture gangrenous appendicitis s/p appendectomy. Post-op course complicated by pelvic abscess for which drain placed by IR and left in place. Pt treated with Rocephin/Flagyl in hospital. [place] home 06/01/2021 on Augmentin for 4 days.   ?I sent in-box message to Dr. Loma Boston who was seeing pt while she was admitted in MAU to inquire whether Cipro is safe in pregnancy.  His reply was ? That traditionally, ciprofloxacin is avoided  in pregnancy due to concerns of fetal malformation. However, there is a recent study that showed for short course, there is no increased risk of fetal malformation and no increase risk in miscarriage and in this case the benefits outweigh the risk. So use for 7 days is acceptable.  I also looked in UpToDate  which stated based on available data, an increased risk of major birth defects, miscarriage, or other adverse fetal and maternal outcomes have not been observed following ciprofloxacin use during pregnancy. ?I then contacted the pt informing her of who I am and my reason for calling.  Informed that the bacteria that is growing so far may not be adequately covered with Augmentin and that we may need to add another antibiotic.  Pt became upset stating that she was given 3 different abx in the hospital and that she was sent home before with the abdominal pain only to return to the hospital with the same issue and was told she had appendicitis. How she has a drain in place.  She asked if the medication would harm her unborn child. I told her that I communicated with one of the MD who was seeing her in MAU named Dr. Nehemiah Settle (she did not recognize the name)and I have looked it up myself.  Risk is never zero but based on what I have gather so far, should be minimal. Pt went on to express frustration about her  care and stated that if anything happens to her baby she will be suing [facility]. She has not est care with an OB/GYN as yet.  Has appt coming up.  She also told me she has appt with the surgeon Dr. Wilma Flavin next wk ?I told her that I will try to touch base with Dr. Radene Gunning an OB/GYN who did a consult on her when she was in the hosp to inquire her opinion about use of Cipro in pregnancy and get back to her before prescribing. ?I was unable to reach Dr. Damita Dunnings or Wilma Flavin today but was able to speak with ID Dr. Gale Journey about the case and got his opinion of whether Cipro needed to be added. He recommended not adding Cipro, continue Augmentin and follow clinically with possible repeat imaging in wks before or after drain removed. Refer to ID if needed. ?I tried calling pt back at 5:30 pm and 6:30 p.m.  I did not leave message on VM. ? ?

## 2021-06-03 ENCOUNTER — Telehealth: Payer: Self-pay | Admitting: Internal Medicine

## 2021-06-03 DIAGNOSIS — K3532 Acute appendicitis with perforation and localized peritonitis, without abscess: Secondary | ICD-10-CM

## 2021-06-03 DIAGNOSIS — N739 Female pelvic inflammatory disease, unspecified: Secondary | ICD-10-CM

## 2021-06-03 NOTE — Telephone Encounter (Signed)
Phone call placed to patient this morning. Informed her that I did try to touch base with Dr. Damita Dunnings and Dr. Wilma Flavin yesterday but was unable to reach them.  Dr. Damita Dunnings sent me an inbox message this morning stating that she would not advise using Cipro in pregnancy.   However in the interim I spoke with ID about her case and I told her his recommendation of not adding any further antibiotics right now and to have her complete the Augmentin that she is currently taking.  I told her that I will try to get her in with ID as soon as possible.  Patient reports that she has not had any further fever or vomiting.  Only pain she is having is at the site from the drainage.  She has upcoming appointment next week with Dr. Maurene Capes and with the interventional radiologist who placed the drainage.  Endorses drainage from the catheter of about 10 mL yesterday.  She is about to drain what has accumulated overnight and it is about 10 mL also.  Advised patient that should she develop any fever, increased abdominal pain or vomiting, she should be seen in the emergency room.  Patient expressed understanding.  She is agreeable to the referral to ID.

## 2021-06-03 NOTE — Telephone Encounter (Signed)
-----   Message from Radene Gunning, MD sent at 06/03/2021  7:36 AM EDT ----- Regarding: RE: Hi, cipro is not safe in pregnancy unfortunately. Is there another option? The floxacins and cyclines are the ones we avoid. ----- Message ----- From: Ladell Pier, MD Sent: 06/02/2021   3:00 PM EDT To: Radene Gunning, MD  I am the PCP for this patient home you saw her recently in the hospital.  She is [redacted] weeks pregnant and had ruptured gangrenous appendicitis.  She was treated with Rocephin and Flagyl while in the hospital and discharged home with Augmentin.  Recent culture from abscess is growing rare Pseudomonas sensitive to Cipro.  Is it okay to prescribe Cipro in pregnancy?

## 2021-06-04 ENCOUNTER — Other Ambulatory Visit: Payer: Self-pay

## 2021-06-04 ENCOUNTER — Telehealth: Payer: Self-pay

## 2021-06-04 ENCOUNTER — Other Ambulatory Visit (HOSPITAL_COMMUNITY): Payer: Self-pay

## 2021-06-04 ENCOUNTER — Ambulatory Visit (INDEPENDENT_AMBULATORY_CARE_PROVIDER_SITE_OTHER): Payer: Medicaid Other | Admitting: Internal Medicine

## 2021-06-04 ENCOUNTER — Encounter: Payer: Self-pay | Admitting: Internal Medicine

## 2021-06-04 VITALS — BP 112/70 | HR 116 | Temp 98.2°F | Wt 139.0 lb

## 2021-06-04 DIAGNOSIS — N739 Female pelvic inflammatory disease, unspecified: Secondary | ICD-10-CM
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
## 2021-06-04 NOTE — Telephone Encounter (Signed)
New OPAT orders per Dr. Candiss Norse, orders shared with Carolynn Sayers, RN at [facility] and [facility] staff.   IR appointment: 05/23 @ 1:30 - appointment time and location provided to both patient and [facility].   First dose: short stay - [facility] aware and orders faxed to short stay.

## 2021-06-05 LAB — COMPLETE METABOLIC PANEL WITH GFR
AG Ratio: 1.5 (calc) (ref 1.0–2.5)
ALT: 18 U/L (ref 6–29)
AST: 13 U/L (ref 10–30)
Albumin: 3.8 g/dL (ref 3.6–5.1)
Alkaline phosphatase (APISO): 64 U/L (ref 31–125)
BUN/Creatinine Ratio: 13 (calc) (ref 6–22)
BUN: 6 mg/dL — ABNORMAL LOW (ref 7–25)
CO2: 25 mmol/L (ref 20–32)
Calcium: 9 mg/dL (ref 8.6–10.2)
Chloride: 103 mmol/L (ref 98–110)
Creat: 0.47 mg/dL — ABNORMAL LOW (ref 0.50–0.96)
Globulin: 2.5 g/dL (calc) (ref 1.9–3.7)
Glucose, Bld: 74 mg/dL (ref 65–99)
Potassium: 3.9 mmol/L (ref 3.5–5.3)
Sodium: 139 mmol/L (ref 135–146)
Total Bilirubin: 0.2 mg/dL (ref 0.2–1.2)
Total Protein: 6.3 g/dL (ref 6.1–8.1)
eGFR: 138 mL/min/{1.73_m2} (ref >=60)

## 2021-06-05 LAB — CBC WITH DIFFERENTIAL/PLATELET
Absolute Monocytes: 634 cells/uL (ref 200–950)
Basophils Absolute: 58 cells/uL (ref 0–200)
Basophils Relative: 0.4 %
Eosinophils Absolute: 144 cells/uL (ref 15–500)
Eosinophils Relative: 1 %
HCT: 32 % — ABNORMAL LOW (ref 35.0–45.0)
Hemoglobin: 10.9 g/dL — ABNORMAL LOW (ref 11.7–15.5)
Lymphs Abs: 2520 cells/uL (ref 850–3900)
MCH: 32.5 pg (ref 27.0–33.0)
MCHC: 34.1 g/dL (ref 32.0–36.0)
MCV: 95.5 fL (ref 80.0–100.0)
MPV: 10.1 fL (ref 7.5–12.5)
Monocytes Relative: 4.4 %
Neutro Abs: 11045 cells/uL — ABNORMAL HIGH (ref 1500–7800)
Neutrophils Relative %: 76.7 %
Platelets: 363 10*3/uL (ref 140–400)
RBC: 3.35 10*6/uL — ABNORMAL LOW (ref 3.80–5.10)
RDW: 12.5 % (ref 11.0–15.0)
Total Lymphocyte: 17.5 %
WBC: 14.4 10*3/uL — ABNORMAL HIGH (ref 3.8–10.8)

## 2021-06-05 LAB — AEROBIC/ANAEROBIC CULTURE W GRAM STAIN (SURGICAL/DEEP WOUND)

## 2021-06-05 LAB — SEDIMENTATION RATE: Sed Rate: 55 mm/h — ABNORMAL HIGH (ref 0–20)

## 2021-06-05 LAB — C-REACTIVE PROTEIN: CRP: 7.9 mg/L (ref <8.0)

## 2021-06-06 ENCOUNTER — Inpatient Hospital Stay (HOSPITAL_COMMUNITY): Payer: Medicaid Other

## 2021-06-06 ENCOUNTER — Other Ambulatory Visit: Payer: Self-pay

## 2021-06-06 ENCOUNTER — Inpatient Hospital Stay (HOSPITAL_COMMUNITY)
Admission: AD | Admit: 2021-06-06 | Discharge: 2021-06-08 | DRG: 832 | Disposition: A | Discharge status: Home / Self Care | Payer: Medicaid Other | Attending: Surgery | Admitting: Surgery

## 2021-06-06 ENCOUNTER — Encounter (HOSPITAL_COMMUNITY): Payer: Self-pay | Admitting: Obstetrics & Gynecology

## 2021-06-06 DIAGNOSIS — O2392 Unspecified genitourinary tract infection in pregnancy, second trimester: Secondary | ICD-10-CM | POA: Diagnosis present

## 2021-06-06 DIAGNOSIS — K651 Peritoneal abscess: Principal | ICD-10-CM

## 2021-06-06 DIAGNOSIS — R112 Nausea with vomiting, unspecified: Secondary | ICD-10-CM | POA: Diagnosis present

## 2021-06-06 DIAGNOSIS — L02211 Cutaneous abscess of abdominal wall: Secondary | ICD-10-CM | POA: Diagnosis present

## 2021-06-06 DIAGNOSIS — Z3A14 14 weeks gestation of pregnancy: Secondary | ICD-10-CM

## 2021-06-06 DIAGNOSIS — K9189 Other postprocedural complications and disorders of digestive system: Secondary | ICD-10-CM | POA: Diagnosis present

## 2021-06-06 DIAGNOSIS — K3532 Acute appendicitis with perforation and localized peritonitis, without abscess: Secondary | ICD-10-CM

## 2021-06-06 DIAGNOSIS — O99612 Diseases of the digestive system complicating pregnancy, second trimester: Secondary | ICD-10-CM | POA: Diagnosis present

## 2021-06-06 DIAGNOSIS — K567 Ileus, unspecified: Secondary | ICD-10-CM | POA: Diagnosis present

## 2021-06-06 DIAGNOSIS — Z9049 Acquired absence of other specified parts of digestive tract: Secondary | ICD-10-CM

## 2021-06-06 DIAGNOSIS — O99712 Diseases of the skin and subcutaneous tissue complicating pregnancy, second trimester: Secondary | ICD-10-CM | POA: Diagnosis present

## 2021-06-06 DIAGNOSIS — R519 Headache, unspecified: Secondary | ICD-10-CM

## 2021-06-06 DIAGNOSIS — O26892 Other specified pregnancy related conditions, second trimester: Secondary | ICD-10-CM

## 2021-06-06 DIAGNOSIS — O219 Vomiting of pregnancy, unspecified: Secondary | ICD-10-CM

## 2021-06-06 LAB — CBC WITH DIFFERENTIAL/PLATELET
Abs Immature Granulocytes: 0.15 10*3/uL — ABNORMAL HIGH (ref 0.00–0.07)
Basophils Absolute: 0 10*3/uL (ref 0.0–0.1)
Basophils Relative: 0 %
Eosinophils Absolute: 0 10*3/uL (ref 0.0–0.5)
Eosinophils Relative: 0 %
HCT: 33.6 % — ABNORMAL LOW (ref 36.0–46.0)
Hemoglobin: 11.3 g/dL — ABNORMAL LOW (ref 12.0–15.0)
Immature Granulocytes: 1 %
Lymphocytes Relative: 15 %
Lymphs Abs: 2.1 10*3/uL (ref 0.7–4.0)
MCH: 32.4 pg (ref 26.0–34.0)
MCHC: 33.6 g/dL (ref 30.0–36.0)
MCV: 96.3 fL (ref 80.0–100.0)
Monocytes Absolute: 0.6 10*3/uL (ref 0.1–1.0)
Monocytes Relative: 4 %
Neutro Abs: 11.1 10*3/uL — ABNORMAL HIGH (ref 1.7–7.7)
Neutrophils Relative %: 80 %
Platelets: 311 10*3/uL (ref 150–400)
RBC: 3.49 MIL/uL — ABNORMAL LOW (ref 3.87–5.11)
RDW: 12.6 % (ref 11.5–15.5)
WBC: 13.9 10*3/uL — ABNORMAL HIGH (ref 4.0–10.5)
nRBC: 0 % (ref 0.0–0.2)

## 2021-06-06 LAB — COMPREHENSIVE METABOLIC PANEL
ALT: 20 U/L (ref 0–44)
AST: 17 U/L (ref 15–41)
Albumin: 3.1 g/dL — ABNORMAL LOW (ref 3.5–5.0)
Alkaline Phosphatase: 54 U/L (ref 38–126)
Anion gap: 8 (ref 5–15)
BUN: 5 mg/dL — ABNORMAL LOW (ref 6–20)
CO2: 22 mmol/L (ref 22–32)
Calcium: 9 mg/dL (ref 8.9–10.3)
Chloride: 104 mmol/L (ref 98–111)
Creatinine, Ser: 0.44 mg/dL (ref 0.44–1.00)
GFR, Estimated: >60 mL/min (ref >60)
Glucose, Bld: 81 mg/dL (ref 70–99)
Potassium: 3.6 mmol/L (ref 3.5–5.1)
Sodium: 134 mmol/L — ABNORMAL LOW (ref 135–145)
Total Bilirubin: 0.6 mg/dL (ref 0.3–1.2)
Total Protein: 6.3 g/dL — ABNORMAL LOW (ref 6.5–8.1)

## 2021-06-06 LAB — URINALYSIS, ROUTINE W REFLEX MICROSCOPIC
Bilirubin Urine: NEGATIVE
Glucose, UA: NEGATIVE mg/dL
Hgb urine dipstick: NEGATIVE
Ketones, ur: 20 mg/dL — AB
Leukocytes,Ua: NEGATIVE
Nitrite: NEGATIVE
Protein, ur: NEGATIVE mg/dL
Specific Gravity, Urine: 1.017 (ref 1.005–1.030)
pH: 6 (ref 5.0–8.0)

## 2021-06-06 MED ORDER — PIPERACILLIN-TAZOBACTAM 3.375 G IVPB
3.3750 g | Freq: Three times a day (TID) | INTRAVENOUS | Status: DC
  Administered 2021-06-06 – 2021-06-08 (×6): 3.375 g via INTRAVENOUS
  Filled 2021-06-06 (×7): qty 50

## 2021-06-06 MED ORDER — LACTATED RINGERS IV SOLN: INTRAVENOUS | Status: DC

## 2021-06-06 MED ORDER — METOCLOPRAMIDE HCL 5 MG/ML IJ SOLN
10.0000 mg | Freq: Four times a day (QID) | INTRAMUSCULAR | Status: DC | PRN
Start: 2021-06-06 — End: 2021-06-08
  Administered 2021-06-06 – 2021-06-07 (×2): 10 mg via INTRAVENOUS
  Filled 2021-06-06 (×2): qty 2

## 2021-06-06 MED ORDER — DIPHENHYDRAMINE HCL 50 MG/ML IJ SOLN
25.0000 mg | Freq: Four times a day (QID) | INTRAMUSCULAR | Status: DC | PRN
  Administered 2021-06-06: 25 mg via INTRAVENOUS
  Filled 2021-06-06: qty 1

## 2021-06-06 NOTE — MAU Note (Signed)
Tina Gibbs is a 21 y.o. at 57w1dhere in MAU reporting: she had a ruptured appendix and had surgery 05/24/2021.  Reports has drain in place and drain isn't draining properly.  States she is now having lower back pain on right side and right lateral pain.  States pain is worse when taking a deep breath.  States also has had a H/A for 3 days that's not relieved with Tylenol.  Also reports has nausea, taking Zofran, states it's not working.  Onset of complaint: 05/21/2021 Pain score: 9/10 HA, 9/10 back pain, 9/10 side pain Vitals:   06/06/21 1838  BP: 123/70  Pulse: (!) 103  Resp: 18  Temp: 98.3 F (36.8 C)  SpO2: 97%     FHT:154 bpm Lab orders placed from triage:   UA
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
## 2021-06-06 NOTE — Progress Notes (Addendum)
Pharmacy Antibiotic Note  Tina Gibbs is a 21 y.o. female admitted on 06/06/2021  65w1dpregnant with  worsening pain around drain which was inserted postoperatively after complications following an appendectomy 05/24/21 .  Pharmacy has been consulted for zosyn dosing.  Plan: Zosyn 3.375g IV q8h (4 hour infusion).  Height: 5' (152.4 cm) Weight: 61.6 kg (135 lb 12.8 oz) IBW/kg (Calculated) : 45.5  Temp (24hrs), Avg:98.9 F (37.2 C), Min:98.3 F (36.8 C), Max:99.4 F (37.4 C)  Recent Labs  Lab 06/01/21 0105 06/04/21 1511 06/06/21 1945  WBC 13.8* 14.4* 13.9*  CREATININE 0.42* 0.47* 0.44    Estimated Creatinine Clearance: 90.4 mL/min (by C-G formula based on SCr of 0.44 mg/dL).    Allergies  Allergen Reactions   Latex Hives and Itching   Peanut-Containing Drug Products Itching     Microbiology results: 05/31/21 RLQ Abscess Rare Pseudomonas Aeruginosa  Thank you for allowing pharmacy to be a part of this patient's care.  Hovey-Rankin, Pier Laux 06/06/2021 10:35 PM
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
## 2021-06-07 ENCOUNTER — Emergency Department: Payer: Self-pay

## 2021-06-07 DIAGNOSIS — O99612 Diseases of the digestive system complicating pregnancy, second trimester: Secondary | ICD-10-CM | POA: Diagnosis present

## 2021-06-07 DIAGNOSIS — L02211 Cutaneous abscess of abdominal wall: Secondary | ICD-10-CM | POA: Diagnosis present

## 2021-06-07 DIAGNOSIS — R112 Nausea with vomiting, unspecified: Secondary | ICD-10-CM | POA: Diagnosis present

## 2021-06-07 DIAGNOSIS — K567 Ileus, unspecified: Secondary | ICD-10-CM

## 2021-06-07 DIAGNOSIS — K9189 Other postprocedural complications and disorders of digestive system: Secondary | ICD-10-CM | POA: Diagnosis present

## 2021-06-07 DIAGNOSIS — O2392 Unspecified genitourinary tract infection in pregnancy, second trimester: Secondary | ICD-10-CM | POA: Diagnosis present

## 2021-06-07 DIAGNOSIS — Z3A14 14 weeks gestation of pregnancy: Secondary | ICD-10-CM | POA: Diagnosis not present

## 2021-06-07 DIAGNOSIS — O99712 Diseases of the skin and subcutaneous tissue complicating pregnancy, second trimester: Secondary | ICD-10-CM | POA: Diagnosis present

## 2021-06-07 LAB — COMPREHENSIVE METABOLIC PANEL
ALT: 16 U/L (ref 0–44)
AST: 13 U/L — ABNORMAL LOW (ref 15–41)
Albumin: 2.6 g/dL — ABNORMAL LOW (ref 3.5–5.0)
Alkaline Phosphatase: 45 U/L (ref 38–126)
Anion gap: 8 (ref 5–15)
BUN: 5 mg/dL — ABNORMAL LOW (ref 6–20)
CO2: 22 mmol/L (ref 22–32)
Calcium: 8.5 mg/dL — ABNORMAL LOW (ref 8.9–10.3)
Chloride: 107 mmol/L (ref 98–111)
Creatinine, Ser: 0.5 mg/dL (ref 0.44–1.00)
GFR, Estimated: >60 mL/min (ref >60)
Glucose, Bld: 81 mg/dL (ref 70–99)
Potassium: 3.6 mmol/L (ref 3.5–5.1)
Sodium: 137 mmol/L (ref 135–145)
Total Bilirubin: 0.7 mg/dL (ref 0.3–1.2)
Total Protein: 5.5 g/dL — ABNORMAL LOW (ref 6.5–8.1)

## 2021-06-07 LAB — CBC
HCT: 27.1 % — ABNORMAL LOW (ref 36.0–46.0)
Hemoglobin: 9.2 g/dL — ABNORMAL LOW (ref 12.0–15.0)
MCH: 32.3 pg (ref 26.0–34.0)
MCHC: 33.9 g/dL (ref 30.0–36.0)
MCV: 95.1 fL (ref 80.0–100.0)
Platelets: 327 10*3/uL (ref 150–400)
RBC: 2.85 MIL/uL — ABNORMAL LOW (ref 3.87–5.11)
RDW: 12.7 % (ref 11.5–15.5)
WBC: 11 10*3/uL — ABNORMAL HIGH (ref 4.0–10.5)
nRBC: 0 % (ref 0.0–0.2)

## 2021-06-07 LAB — MAGNESIUM: Magnesium: 1.7 mg/dL (ref 1.7–2.4)

## 2021-06-07 IMAGING — MR MR PELVIS W/O CM
5 series · 47 of 48 positions shown · non-contrast
Comparison: [DATE], [DATE]

CLINICAL DATA: Right lower quadrant abdominal pain. History of
perforated appendicitis status post surgery [DATE] complicated
by abscess formation and drain placement on [DATE].

EXAM:
MRI ABDOMEN AND PELVIS WITHOUT CONTRAST
TECHNIQUE: Multiplanar multisequence MR imaging of the abdomen and pelvis was
performed. No intravenous contrast was administered.

[Series 4: T2 · coronal · 5.0mm · 1.41mm/px · 7 of 30 slices shown]
[im 1/30]
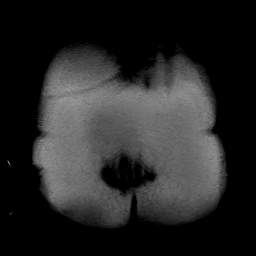
[im 5/30]
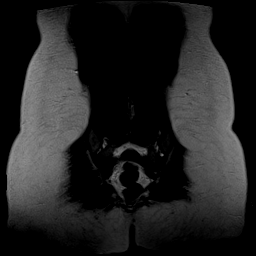
[im 10/30]
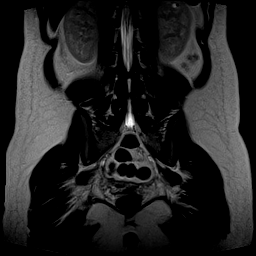
[im 15/30]
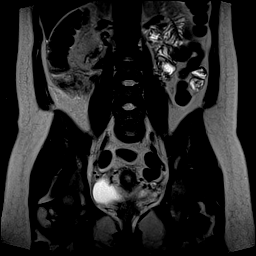
[im 20/30]
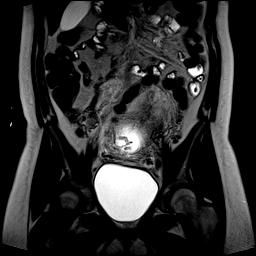
[im 25/30]
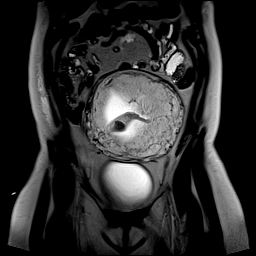
[im 30/30]
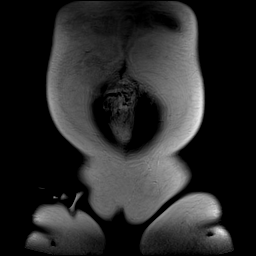

[Series 5: ax tse trig · axial · 5.0mm · 0.75mm/px · z∈[-250,-34]mm · 8 of 37 slices shown]
[im 1/37]
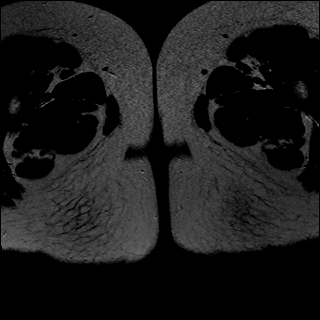
[im 6/37]
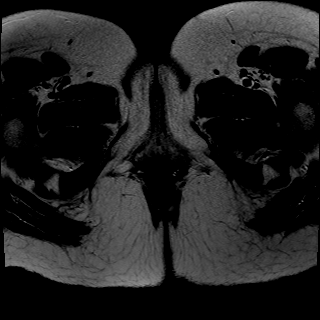
[im 11/37]
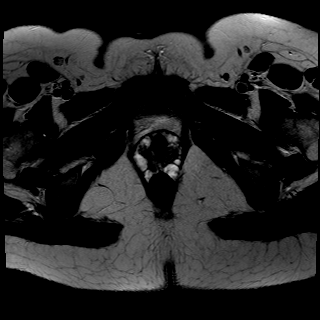
[im 16/37]
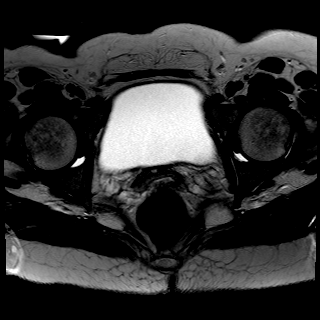
[im 21/37]
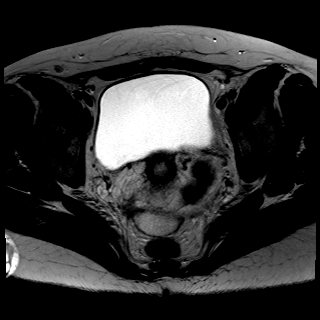
[im 26/37]
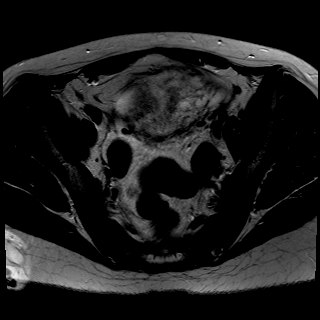
[im 31/37]
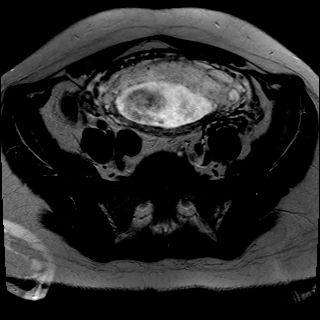
[im 37/37]
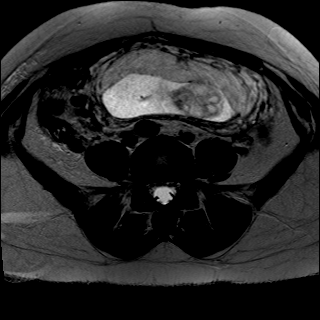

[Series 6: ax tse fs · axial · 5.0mm · 0.75mm/px · z∈[-250,-34]mm · 8 of 37 slices shown]
[im 1/37]
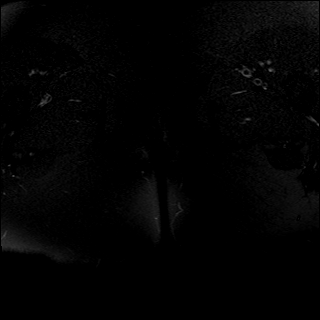
[im 6/37]
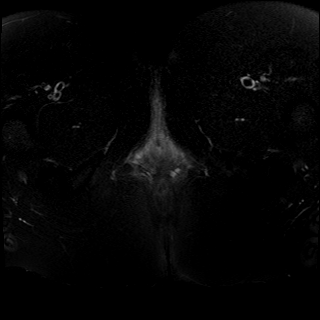
[im 11/37]
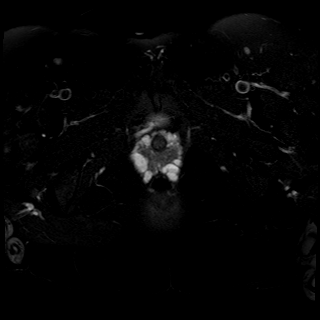
[im 16/37]
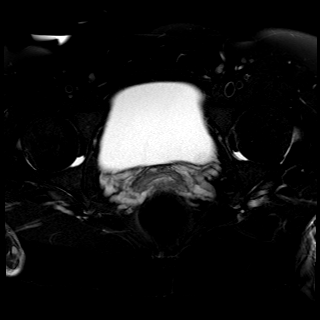
[im 21/37]
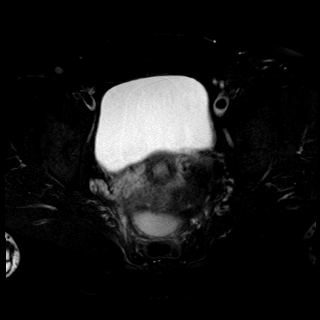
[im 26/37]
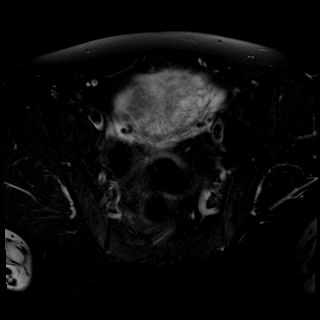
[im 31/37]
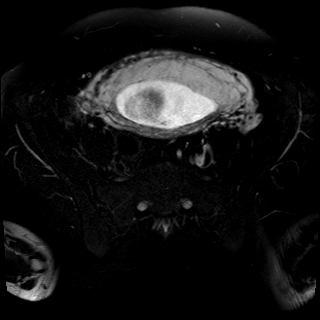
[im 37/37]
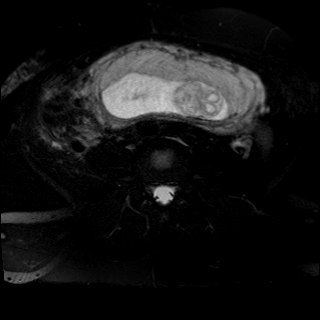

[Series 7: sag tse trig · sagittal · 5.0mm · 0.75mm/px · 8 of 33 slices shown]
[im 1/33]
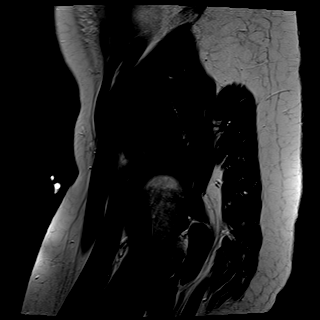
[im 5/33]
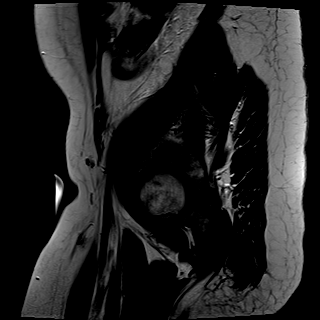
[im 10/33]
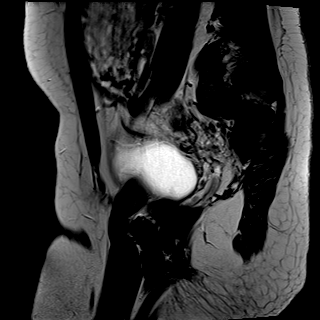
[im 14/33]
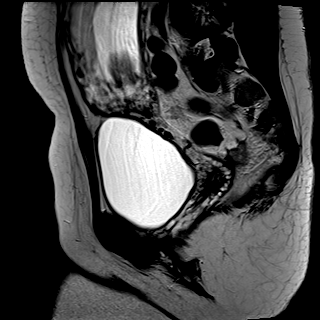
[im 19/33]
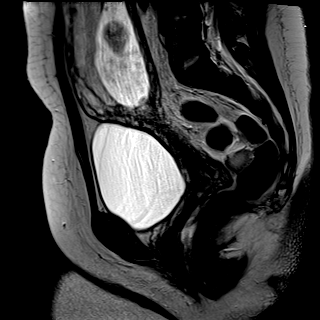
[im 23/33]
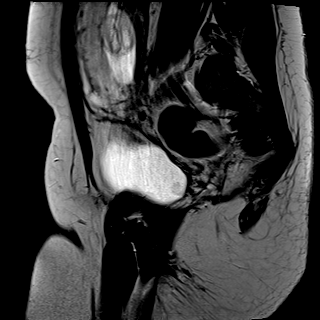
[im 28/33]
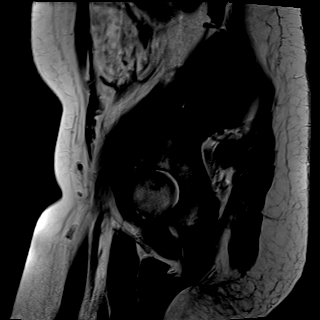
[im 33/33]
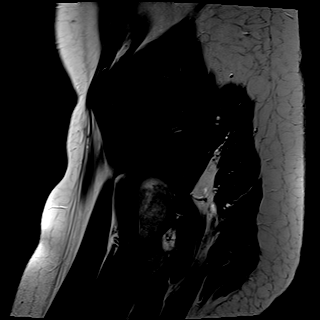

[Series 8: t1_vibe_fs_[id number]_bh_pre · axial · 3.0mm · 0.88mm/px · z∈[-248,-50]mm · 16 of 72 slices shown]
[im 1/72]
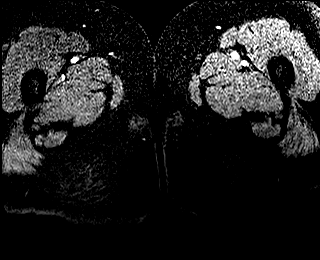
[im 5/72]
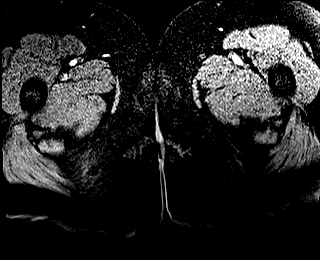
[im 9/72]
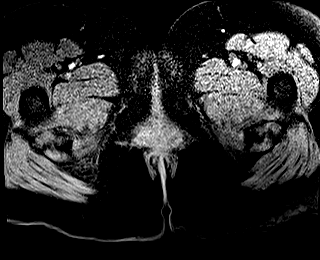
[im 14/72]
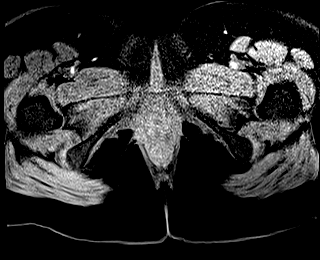
[im 18/72]
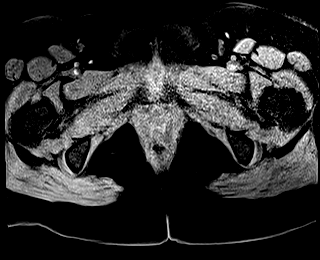
[im 23/72]
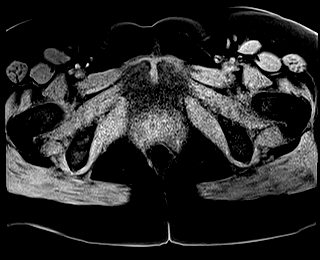
[im 27/72]
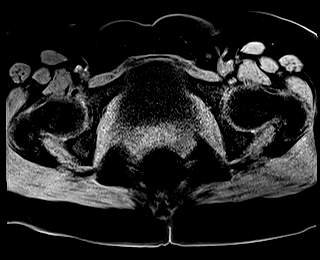
[im 32/72]
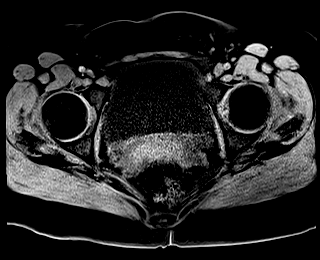
[im 36/72]
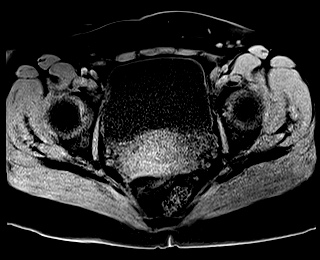
[im 40/72]
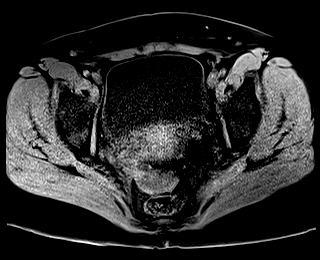
[im 45/72]
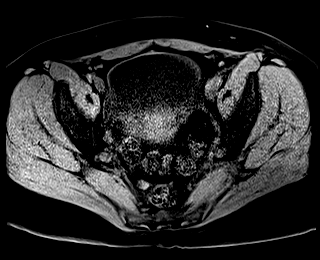
[im 49/72]
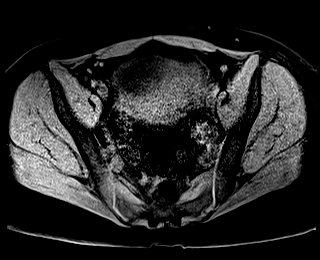
[im 54/72]
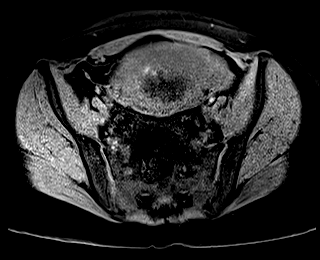
[im 58/72]
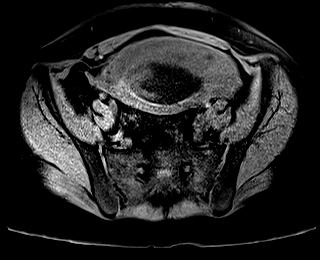
[im 63/72]
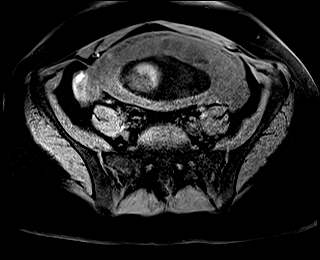
[im 67/72]
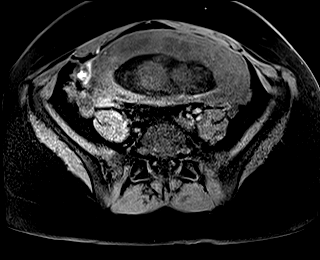

[47 of 48 positions shown; findings below may reference images not displayed]

FINDINGS: COMBINED FINDINGS FOR BOTH MR ABDOMEN AND PELVIS

Lower chest: No acute findings.

Hepatobiliary: Normal hepatic parenchymal signal intensity. No focal
intrahepatic masses are seen. No intra or extrahepatic biliary
ductal dilation. Normal flow voids within the portal vein.
Gallbladder unremarkable.

Pancreas: No mass, inflammatory changes, or other parenchymal
abnormality identified.

Spleen:  Within normal limits in size and appearance.

Adrenals/Urinary Tract: The adrenal glands are unremarkable. The
kidneys are normal in size and position. 5 mm simple cortical cyst
within the upper pole of the left kidney. No hydronephrosis. No
intrarenal masses. The bladder is unremarkable.

Stomach/Bowel: Since the prior examination, percutaneous drainage
catheter has been placed within the retrocecal fluid collection
which has been largely evacuated. No significant residual fluid
component is identified. There is residual inflammatory change
within this region as well as a trace amount of layering fluid
within the right pericolic gutter. Small amount of subcutaneous
fluid is seen within the right lateral abdominal wall, improved
since prior examination. A separate 3.7 x 1.7 cm loculated fluid
collection is seen within the cul-de-sac, best seen on image # 17,
series 5. The stomach, small bowel, and large bowel are otherwise
unremarkable. Surgical changes of appendectomy are again identified.

Vascular/Lymphatic: No pathologically enlarged lymph nodes
identified. No abdominal aortic aneurysm demonstrated.

Reproductive: Gravid uterus with single fetus again identified, not
optimally assessed on this exam.

Other: Edema within the periumbilical region likely relates to
recent surgery. No discrete drainable fluid collection.

Musculoskeletal: No suspicious bone lesions identified.
IMPRESSION: Interval percutaneous drainage and evacuation of retrocecal fluid
collection with no significant residual fluid component identified.
Persistent mild inflammatory changes within the region as well as
trace layering fluid within the right pericolic gutter. Improving
fluid within the right lateral abdominal wall, likely postsurgical
in nature.

New loculated 3.7 cm fluid collection within the cul-de-sac.

Surgical changes of appendectomy identified.

## 2021-06-07 MED ORDER — ONDANSETRON HCL 4 MG/2ML IJ SOLN
4.0000 mg | Freq: Four times a day (QID) | INTRAMUSCULAR | Status: DC | PRN
  Administered 2021-06-07: 4 mg via INTRAVENOUS
  Filled 2021-06-07: qty 2

## 2021-06-07 MED ORDER — ACETAMINOPHEN 650 MG RE SUPP: 650.0000 mg | Freq: Four times a day (QID) | RECTAL | Status: DC | PRN

## 2021-06-07 MED ORDER — PRENATAL MULTIVITAMIN CH
1.0000 | ORAL_TABLET | Freq: Every day | ORAL | Status: DC
  Administered 2021-06-07 – 2021-06-08 (×2): 1 via ORAL
  Filled 2021-06-07 (×2): qty 1

## 2021-06-07 MED ORDER — HEPARIN SODIUM (PORCINE) 5000 UNIT/ML IJ SOLN
5000.0000 [IU] | Freq: Three times a day (TID) | INTRAMUSCULAR | Status: DC
  Filled 2021-06-07 (×3): qty 1

## 2021-06-07 MED ORDER — OXYCODONE HCL 5 MG PO TABS: 5.0000 mg | ORAL_TABLET | ORAL | Status: DC | PRN

## 2021-06-07 MED ORDER — KETOROLAC TROMETHAMINE 30 MG/ML IJ SOLN
30.0000 mg | Freq: Once | INTRAMUSCULAR | Status: AC
  Administered 2021-06-07: 30 mg via INTRAVENOUS
  Filled 2021-06-07: qty 1

## 2021-06-07 MED ORDER — ACETAMINOPHEN 325 MG PO TABS: 650.0000 mg | ORAL_TABLET | Freq: Four times a day (QID) | ORAL | Status: DC | PRN

## 2021-06-07 MED ORDER — ONDANSETRON 4 MG PO TBDP
4.0000 mg | ORAL_TABLET | Freq: Four times a day (QID) | ORAL | Status: DC | PRN
Start: 2021-06-07 — End: 2021-06-08

## 2021-06-07 MED ORDER — MORPHINE SULFATE (PF) 2 MG/ML IV SOLN: 1.0000 mg | INTRAVENOUS | Status: DC | PRN

## 2021-06-07 MED ORDER — SIMETHICONE 80 MG PO CHEW: 40.0000 mg | CHEWABLE_TABLET | Freq: Four times a day (QID) | ORAL | Status: DC | PRN

## 2021-06-07 NOTE — Progress Notes (Signed)
Request received for pelvic fluid collection drain from [facility]. After review, Dr. Dwaine Gale, IR, states that the pelvic collection is too small at this time to drain. Pt currently has WBC that is trending down, afebrile and no pain at the site of concern.   Pt has existing RLQ abdominal drain that was placed 5/15 that IR will follow during this admission.    Narda Rutherford, AGNP-BC 06/07/2021, 11:49 AM

## 2021-06-07 NOTE — TOC Initial Note (Signed)
Transition of Care [facility]) - Initial/Assessment Note    Patient Details  Name: JAYLYN IYER MRN: [id number] Date of Birth: 03/10/2000  Transition of Care [facility]) CM/SW Contact:    Marilu Favre, RN Phone Number: 06/07/2021, 12:43 PM  Clinical Narrative:                 Spoke to patient at bedside.   Confirmed face sheet information . Patient from home with significant other.   PCP Karle Plumber, has Medicaid.   Plan to discharge to home when medically ready with IV ABX.   Planned OP start of IV ABX , however patient was admitted due to medical reasons.   Discussed IV ABX at home. [employer] is the infusion company , they have a nurse Pam who who provide teaching to her and her support person prior to discharge. She will have a HHRN from [facility] , however [place] will not be present every time a dose is due. Patient voiced understanding.  Expected Discharge Plan: [place] Barriers to Discharge: Continued Medical Work up   Patient Goals and CMS Choice Patient states their goals for this hospitalization and ongoing recovery are:: to return to home CMS Medicare.gov Compare Post Acute Care list provided to:: Patient Choice offered to / list presented to : Patient  Expected Discharge Plan and Services Expected Discharge Plan: [place] Choice: [place] arrangements for the past 2 months: Single Family Home                 DME Arranged: N/A DME Agency: NA         HH Agency:  ([place])        Prior Living Arrangements/Services Living arrangements for the past 2 months: [place] Lives with:: Significant Other Patient language and need for interpreter reviewed:: Yes Do you feel safe going back to the place where you live?: Yes      Need for Family Participation in Patient Care: Yes (Comment) Care giver support system in place?: Yes (comment)   Criminal Activity/Legal  Involvement Pertinent to Current Situation/Hospitalization: No - Comment as needed  Activities of Daily Living      Permission Sought/Granted   Permission granted to share information with : No              Emotional Assessment Appearance:: Appears stated age Attitude/Demeanor/Rapport: Engaged Affect (typically observed): Accepting Orientation: : Oriented to Self, Oriented to Place, Oriented to  Time, Oriented to Situation Alcohol / Substance Use: Not Applicable Psych Involvement: No (comment)  Admission diagnosis:  Ileus ([place]) [K56.7] S/P laparoscopic appendectomy [Z90.49] Nausea and vomiting in pregnancy [O21.9] Acute perforated appendicitis [K35.32] [redacted] weeks gestation of pregnancy [Z3A.14] Right lower quadrant abdominal abscess ([place]) [K65.1] Pregnancy headache in second trimester [O26.892, R51.9] Patient Active Problem List   Diagnosis Date Noted   Right lower quadrant abdominal abscess ([place]) 06/06/2021   Ileus ([place]) 06/06/2021   Acute perforated appendicitis 05/24/2021   Non-reassuring electronic fetal monitoring tracing 02/07/2019   Anemia in pregnancy 12/03/2018   UTI in pregnancy 08/22/2018   GBS (group B streptococcus) UTI complicating pregnancy 28/41/3244   Anxiety and depression 08/15/2018   Supervision of high risk pregnancy, antepartum 08/01/2018   Asthma    Influenza vaccination declined 03/06/2015   Unspecified asthma(493.90) 10/15/2012   Seasonal allergies 10/15/2012   ADHD (attention deficit hyperactivity disorder) 10/15/2012   PCP:  Karle Plumber  Jacinto Reap, MD Pharmacy:   [facility] [place], [place] [address] Phone: [phone] Fax: [place], [place] [address] [place] [address] Phone: [phone] Fax: [phone]  [id number]: [phone]: [address] [place]Phone: [phone]: [phone]    Social Determinants of Health (SDOH) Interventions    Readmission Risk Interventions     View : No data to display.

## 2021-06-07 NOTE — Progress Notes (Signed)
Pt transferred to [facility] ED. Report given to charge nurse and Humphrey Rolls, RN. Care relinquished.
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
## 2021-06-07 NOTE — Progress Notes (Signed)
Progress Note     Subjective: Pt reports RLQ pain and some nausea. No further vomiting. She is still passing flatus and had a BM yesterday. Drain previously appeared more like old hematoma with some milkiness and now is more clear light brown she reports.   Objective: Vital signs in last 24 hours: Temp:  [98.3 F (36.8 C)-99.4 F (37.4 C)] 98.3 F (36.8 C) (05/22 0535) Pulse Rate:  [87-103] 87 (05/22 0535) Resp:  [14-18] 18 (05/22 0535) BP: (106-123)/(45-70) 110/45 (05/22 0535) SpO2:  [96 %-98 %] 98 % (05/22 0536) Weight:  [61.6 kg] 61.6 kg (05/21 1829)    Intake/Output from previous day: No intake/output data recorded. Intake/Output this shift: No intake/output data recorded.  PE: General: pleasant, WD, WN female who is laying in bed in NAD Heart: regular, rate, and rhythm.   Lungs: CTAB, no wheezes, rhonchi, or rales noted.  Respiratory effort nonlabored Abd: soft, mild ttp over drain in RLQ, ND, +BS MS: all 4 extremities are symmetrical with no cyanosis, clubbing, or edema. Skin: warm and dry with no masses, lesions, or rashes Neuro: Cranial nerves 2-12 grossly intact, sensation is normal throughout Psych: A&Ox3 with an appropriate affect.    Lab Results:  Recent Labs    06/06/21 1945 06/07/21 0751  WBC 13.9* 11.0*  HGB 11.3* 9.2*  HCT 33.6* 27.1*  PLT 311 327   BMET Recent Labs    06/06/21 1945 06/07/21 0751  NA 134* 137  K 3.6 3.6  CL 104 107  CO2 22 22  GLUCOSE 81 81  BUN 5* 5*  CREATININE 0.44 0.50  CALCIUM 9.0 8.5*   PT/INR No results for input(s): LABPROT, INR in the last 72 hours. CMP     Component Value Date/Time   NA 137 06/07/2021 0751   K 3.6 06/07/2021 0751   CL 107 06/07/2021 0751   CO2 22 06/07/2021 0751   GLUCOSE 81 06/07/2021 0751   BUN 5 (L) 06/07/2021 0751   CREATININE 0.50 06/07/2021 0751   CREATININE 0.47 (L) 06/04/2021 1511   CALCIUM 8.5 (L) 06/07/2021 0751   PROT 5.5 (L) 06/07/2021 0751   ALBUMIN 2.6 (L) 06/07/2021  0751   AST 13 (L) 06/07/2021 0751   ALT 16 06/07/2021 0751   ALKPHOS 45 06/07/2021 0751   BILITOT 0.7 06/07/2021 0751   GFRNONAA >60 06/07/2021 0751   GFRAA >60 02/07/2019 1830   Lipase     Component Value Date/Time   LIPASE 23 05/24/2021 0039       Studies/Results: MR PELVIS WO CONTRAST  Result Date: 06/07/2021 CLINICAL DATA:  Right lower quadrant abdominal pain. History of perforated appendicitis status post surgery 86/76/1950 complicated by abscess formation and drain placement on 05/31/2021. EXAM: MRI ABDOMEN AND PELVIS WITHOUT CONTRAST TECHNIQUE: Multiplanar multisequence MR imaging of the abdomen and pelvis was performed. No intravenous contrast was administered. COMPARISON:  05/29/2021, 05/24/2021 FINDINGS: COMBINED FINDINGS FOR BOTH MR ABDOMEN AND PELVIS Lower chest: No acute findings. Hepatobiliary: Normal hepatic parenchymal signal intensity. No focal intrahepatic masses are seen. No intra or extrahepatic biliary ductal dilation. Normal flow voids within the portal vein. Gallbladder unremarkable. Pancreas: No mass, inflammatory changes, or other parenchymal abnormality identified. Spleen:  Within normal limits in size and appearance. Adrenals/Urinary Tract: The adrenal glands are unremarkable. The kidneys are normal in size and position. 5 mm simple cortical cyst within the upper pole of the left kidney. No hydronephrosis. No intrarenal masses. The bladder is unremarkable. Stomach/Bowel: Since the prior examination, percutaneous drainage  catheter has been placed within the retrocecal fluid collection which has been largely evacuated. No significant residual fluid component is identified. There is residual inflammatory change within this region as well as a trace amount of layering fluid within the right pericolic gutter. Small amount of subcutaneous fluid is seen within the right lateral abdominal wall, improved since prior examination. A separate 3.7 x 1.7 cm loculated fluid collection  is seen within the cul-de-sac, best seen on image # 17, series 5. The stomach, small bowel, and large bowel are otherwise unremarkable. Surgical changes of appendectomy are again identified. Vascular/Lymphatic: No pathologically enlarged lymph nodes identified. No abdominal aortic aneurysm demonstrated. Reproductive: Gravid uterus with single fetus again identified, not optimally assessed on this exam. Other: Edema within the periumbilical region likely relates to recent surgery. No discrete drainable fluid collection. Musculoskeletal: No suspicious bone lesions identified. IMPRESSION: Interval percutaneous drainage and evacuation of retrocecal fluid collection with no significant residual fluid component identified. Persistent mild inflammatory changes within the region as well as trace layering fluid within the right pericolic gutter. Improving fluid within the right lateral abdominal wall, likely postsurgical in nature. New loculated 3.7 cm fluid collection within the cul-de-sac. Surgical changes of appendectomy identified. Electronically Signed   By: Fidela Salisbury M.D.   On: 06/07/2021 02:32   MR ABDOMEN WO CONTRAST  Result Date: 06/07/2021 CLINICAL DATA:  Right lower quadrant abdominal pain. History of perforated appendicitis status post surgery 01/74/9449 complicated by abscess formation and drain placement on 05/31/2021. EXAM: MRI ABDOMEN AND PELVIS WITHOUT CONTRAST TECHNIQUE: Multiplanar multisequence MR imaging of the abdomen and pelvis was performed. No intravenous contrast was administered. COMPARISON:  05/29/2021, 05/24/2021 FINDINGS: COMBINED FINDINGS FOR BOTH MR ABDOMEN AND PELVIS Lower chest: No acute findings. Hepatobiliary: Normal hepatic parenchymal signal intensity. No focal intrahepatic masses are seen. No intra or extrahepatic biliary ductal dilation. Normal flow voids within the portal vein. Gallbladder unremarkable. Pancreas: No mass, inflammatory changes, or other parenchymal abnormality  identified. Spleen:  Within normal limits in size and appearance. Adrenals/Urinary Tract: The adrenal glands are unremarkable. The kidneys are normal in size and position. 5 mm simple cortical cyst within the upper pole of the left kidney. No hydronephrosis. No intrarenal masses. The bladder is unremarkable. Stomach/Bowel: Since the prior examination, percutaneous drainage catheter has been placed within the retrocecal fluid collection which has been largely evacuated. No significant residual fluid component is identified. There is residual inflammatory change within this region as well as a trace amount of layering fluid within the right pericolic gutter. Small amount of subcutaneous fluid is seen within the right lateral abdominal wall, improved since prior examination. A separate 3.7 x 1.7 cm loculated fluid collection is seen within the cul-de-sac, best seen on image # 17, series 5. The stomach, small bowel, and large bowel are otherwise unremarkable. Surgical changes of appendectomy are again identified. Vascular/Lymphatic: No pathologically enlarged lymph nodes identified. No abdominal aortic aneurysm demonstrated. Reproductive: Gravid uterus with single fetus again identified, not optimally assessed on this exam. Other: Edema within the periumbilical region likely relates to recent surgery. No discrete drainable fluid collection. Musculoskeletal: No suspicious bone lesions identified. IMPRESSION: Interval percutaneous drainage and evacuation of retrocecal fluid collection with no significant residual fluid component identified. Persistent mild inflammatory changes within the region as well as trace layering fluid within the right pericolic gutter. Improving fluid within the right lateral abdominal wall, likely postsurgical in nature. New loculated 3.7 cm fluid collection within the cul-de-sac. Surgical changes of appendectomy identified.  Electronically Signed   By: Fidela Salisbury M.D.   On: 06/07/2021 02:32     Anti-infectives: Anti-infectives (From admission, onward)    Start     Dose/Rate Route Frequency Ordered Stop   06/06/21 2030  piperacillin-tazobactam (ZOSYN) IVPB 3.375 g        3.375 g 12.5 mL/hr over 240 Minutes Intravenous Every 8 hours 06/06/21 2019          Assessment/Plan Recent perforated gangrenous appendicitis s/p lap appendectomy 05/24/21 Dr. Brantley Stage Post-op abscess s/p IR drain placement 5/15 Recurrent nausea and vomiting  - Cxs from IR drain grew pseudomonas, ID recommended 4 weeks zosyn and patient was scheduled for PICC placement - came in overnight with increased nausea but having bowel function, no mention of abnormal bowel gas pattern on MRI abdomen/pelvis - WBC 11 from 13.9, HD stable and afebrile - will touch base with ID and IR today and confirm abx regimen and need for PICC as well as ask IR to assess current drain and new 3.7 cm collection noted on MRI  FEN: sips of clears, IVF '@125'$  cc/h VTE: SQH ID: Zosyn 5/21>>  [id number] pregnancy - MAU following     LOS: 1 day     KNorm Parcel [facility] 06/07/2021, 9:39 AM Please see Amion for pager number during day hours 7:00am-4:30pm
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
## 2021-06-07 NOTE — Progress Notes (Signed)
Referring Physician(s): Johnathan Hausen, MD  Supervising Physician: Mir, Sharen Heck  Patient Status:  [facility] - In-pt  Chief Complaint:  Acute perforated appendicitis, s/p RLQ drain placement 05/31/21 with Dr. Maryelizabeth Kaufmann.   Subjective:  Pt resting in bed on phone. She is A&O. She states she is feeling much better since she has received IV abx. She adds that her nausea has also resolved. She states that she has been flushing drain at home with last OP ~8 cc over 1 day.   Allergies: Latex and Peanut-containing drug products  Medications: Prior to Admission medications   Medication Sig Start Date End Date Taking? Authorizing Provider  acetaminophen (TYLENOL) 325 MG tablet Take 2 tablets (650 mg total) by mouth every 4 (four) hours as needed for mild pain (or temp > 100). Patient taking differently: Take 325 mg by mouth 2 (two) times daily as needed for headache (pain). 06/01/21  Yes Saverio Danker, PA-C  famotidine (PEPCID) 20 MG tablet Take 1 tablet (20 mg total) by mouth daily. Patient taking differently: Take 20 mg by mouth daily as needed for heartburn. 04/11/21 04/11/22 Yes Nugent, Gerrie Nordmann, NP  metoCLOPramide (REGLAN) 10 MG tablet Take 1 tablet (10 mg total) by mouth 3 (three) times daily with meals. Patient taking differently: Take 10 mg by mouth 3 (three) times daily as needed for vomiting or nausea. 04/11/21 04/11/22 Yes Nugent, Gerrie Nordmann, NP  ondansetron (ZOFRAN ODT) 8 MG disintegrating tablet Take 1 tablet (8 mg total) by mouth every 8 (eight) hours as needed for nausea or vomiting. Patient taking differently: Take 8 mg by mouth in the morning and at bedtime. 05/21/21  Yes Rasch, Anderson Malta I, NP  oxyCODONE (OXY IR/ROXICODONE) 5 MG immediate release tablet Take 1 tablet (5 mg total) by mouth every 4 (four) hours as needed for moderate pain. Patient taking differently: Take 5 mg by mouth daily as needed (pain). 06/01/21  Yes Saverio Danker, PA-C  Prenatal Vit-Iron Carbonyl-FA (PRENATABS RX) 29-1  MG TABS Take 1 tablet by mouth daily. 04/11/21  Yes Nugent, Gerrie Nordmann, NP  Sodium Chloride Flush (NORMAL SALINE FLUSH) 0.9 % SOLN Flush with 5 mLs by Intracatheter route daily. 06/01/21  Yes Boisseau, Hayley, PA     Vital Signs: BP 118/78 (BP Location: Right Arm)   Pulse 88   Temp 98.3 F (36.8 C) (Oral)   Resp 18   Ht 5' (1.524 m)   Wt 135 lb 12.8 oz (61.6 kg)   LMP 02/08/2021 (Exact Date)   SpO2 100%   BMI 26.52 kg/m   Physical Exam Vitals reviewed.  Constitutional:      General: She is not in acute distress.    Appearance: Normal appearance. She is not ill-appearing.  HENT:     Head: Normocephalic and atraumatic.  Eyes:     Extraocular Movements: Extraocular movements intact.     Pupils: Pupils are equal, round, and reactive to light.  Pulmonary:     Effort: Pulmonary effort is normal. No respiratory distress.  Abdominal:     Comments: RLQ drain unremarkable with sutures/ statlock in place. Scant amt serosanguinous OP in JP drain. Drain flushes/aspirates. Dressing C/D/I  Skin:    General: Skin is warm and dry.  Neurological:     Mental Status: She is alert and oriented to person, place, and time.  Psychiatric:        Mood and Affect: Mood normal.        Behavior: Behavior normal.  Thought Content: Thought content normal.        Judgment: Judgment normal.    Imaging: MR PELVIS WO CONTRAST  Result Date: 06/07/2021 CLINICAL DATA:  Right lower quadrant abdominal pain. History of perforated appendicitis status post surgery 09/32/6712 complicated by abscess formation and drain placement on 05/31/2021. EXAM: MRI ABDOMEN AND PELVIS WITHOUT CONTRAST TECHNIQUE: Multiplanar multisequence MR imaging of the abdomen and pelvis was performed. No intravenous contrast was administered. COMPARISON:  05/29/2021, 05/24/2021 FINDINGS: COMBINED FINDINGS FOR BOTH MR ABDOMEN AND PELVIS Lower chest: No acute findings. Hepatobiliary: Normal hepatic parenchymal signal intensity. No focal  intrahepatic masses are seen. No intra or extrahepatic biliary ductal dilation. Normal flow voids within the portal vein. Gallbladder unremarkable. Pancreas: No mass, inflammatory changes, or other parenchymal abnormality identified. Spleen:  Within normal limits in size and appearance. Adrenals/Urinary Tract: The adrenal glands are unremarkable. The kidneys are normal in size and position. 5 mm simple cortical cyst within the upper pole of the left kidney. No hydronephrosis. No intrarenal masses. The bladder is unremarkable. Stomach/Bowel: Since the prior examination, percutaneous drainage catheter has been placed within the retrocecal fluid collection which has been largely evacuated. No significant residual fluid component is identified. There is residual inflammatory change within this region as well as a trace amount of layering fluid within the right pericolic gutter. Small amount of subcutaneous fluid is seen within the right lateral abdominal wall, improved since prior examination. A separate 3.7 x 1.7 cm loculated fluid collection is seen within the cul-de-sac, best seen on image # 17, series 5. The stomach, small bowel, and large bowel are otherwise unremarkable. Surgical changes of appendectomy are again identified. Vascular/Lymphatic: No pathologically enlarged lymph nodes identified. No abdominal aortic aneurysm demonstrated. Reproductive: Gravid uterus with single fetus again identified, not optimally assessed on this exam. Other: Edema within the periumbilical region likely relates to recent surgery. No discrete drainable fluid collection. Musculoskeletal: No suspicious bone lesions identified. IMPRESSION: Interval percutaneous drainage and evacuation of retrocecal fluid collection with no significant residual fluid component identified. Persistent mild inflammatory changes within the region as well as trace layering fluid within the right pericolic gutter. Improving fluid within the right lateral  abdominal wall, likely postsurgical in nature. New loculated 3.7 cm fluid collection within the cul-de-sac. Surgical changes of appendectomy identified. Electronically Signed   By: Fidela Salisbury M.D.   On: 06/07/2021 02:32   MR ABDOMEN WO CONTRAST  Result Date: 06/07/2021 CLINICAL DATA:  Right lower quadrant abdominal pain. History of perforated appendicitis status post surgery 45/80/9983 complicated by abscess formation and drain placement on 05/31/2021. EXAM: MRI ABDOMEN AND PELVIS WITHOUT CONTRAST TECHNIQUE: Multiplanar multisequence MR imaging of the abdomen and pelvis was performed. No intravenous contrast was administered. COMPARISON:  05/29/2021, 05/24/2021 FINDINGS: COMBINED FINDINGS FOR BOTH MR ABDOMEN AND PELVIS Lower chest: No acute findings. Hepatobiliary: Normal hepatic parenchymal signal intensity. No focal intrahepatic masses are seen. No intra or extrahepatic biliary ductal dilation. Normal flow voids within the portal vein. Gallbladder unremarkable. Pancreas: No mass, inflammatory changes, or other parenchymal abnormality identified. Spleen:  Within normal limits in size and appearance. Adrenals/Urinary Tract: The adrenal glands are unremarkable. The kidneys are normal in size and position. 5 mm simple cortical cyst within the upper pole of the left kidney. No hydronephrosis. No intrarenal masses. The bladder is unremarkable. Stomach/Bowel: Since the prior examination, percutaneous drainage catheter has been placed within the retrocecal fluid collection which has been largely evacuated. No significant residual fluid component is identified. There is  residual inflammatory change within this region as well as a trace amount of layering fluid within the right pericolic gutter. Small amount of subcutaneous fluid is seen within the right lateral abdominal wall, improved since prior examination. A separate 3.7 x 1.7 cm loculated fluid collection is seen within the cul-de-sac, best seen on image # 17,  series 5. The stomach, small bowel, and large bowel are otherwise unremarkable. Surgical changes of appendectomy are again identified. Vascular/Lymphatic: No pathologically enlarged lymph nodes identified. No abdominal aortic aneurysm demonstrated. Reproductive: Gravid uterus with single fetus again identified, not optimally assessed on this exam. Other: Edema within the periumbilical region likely relates to recent surgery. No discrete drainable fluid collection. Musculoskeletal: No suspicious bone lesions identified. IMPRESSION: Interval percutaneous drainage and evacuation of retrocecal fluid collection with no significant residual fluid component identified. Persistent mild inflammatory changes within the region as well as trace layering fluid within the right pericolic gutter. Improving fluid within the right lateral abdominal wall, likely postsurgical in nature. New loculated 3.7 cm fluid collection within the cul-de-sac. Surgical changes of appendectomy identified. Electronically Signed   By: Fidela Salisbury M.D.   On: 06/07/2021 02:32   [place] EKG SITE RITE  Result Date: 06/07/2021 If [facility] image not attached, placement could not be confirmed due to current cardiac rhythm.   Labs:  CBC: Recent Labs    06/01/21 0105 06/04/21 1511 06/06/21 1945 06/07/21 0751  WBC 13.8* 14.4* 13.9* 11.0*  HGB 10.4* 10.9* 11.3* 9.2*  HCT 30.5* 32.0* 33.6* 27.1*  PLT 317 363 311 327    COAGS: No results for input(s): INR, APTT in the last 8760 hours.  BMP: Recent Labs    05/28/21 0459 06/01/21 0105 06/04/21 1511 06/06/21 1945 06/07/21 0751  NA 136 135 139 134* 137  K 3.8 3.9 3.9 3.6 3.6  CL 105 105 103 104 107  CO2 '22 23 25 22 22  '$ GLUCOSE 80 81 74 81 81  BUN 5* <5* 6* 5* 5*  CALCIUM 8.2* 8.6* 9.0 9.0 8.5*  CREATININE 0.43* 0.42* 0.47* 0.44 0.50  GFRNONAA >60 >60  --  >60 >60    LIVER FUNCTION TESTS: Recent Labs    05/21/21 2124 05/24/21 0039 06/04/21 1511 06/06/21 1945  06/07/21 0751  BILITOT 0.3 0.9 0.2 0.6 0.7  AST 16 13* 13 17 13*  ALT '13 12 18 20 16  '$ ALKPHOS 39 45  --  54 45  PROT 6.4* 6.4* 6.3 6.3* 5.5*  ALBUMIN 3.4* 3.2*  --  3.1* 2.6*    Assessment and Plan:  Acute perforated appendicitis, s/p RLQ drain placement 05/31/21 with Dr. Maryelizabeth Kaufmann.   Pt resting in bed on phone. She is A&O. She states she is feeling much better since she has received IV abx. She adds that her nausea has also resolved. She states that she has been flushing drain at home with last OP ~8 cc over 1 day.   RLQ drain unremarkable with sutures/ statlock in place. Scant amt serosanguinous OP in JP drain. Drain flushes/aspirates. Dressing C/D/I  WBC 11.0 (13.9) Afebrile   Continue documenting OP in Epic q shift.  Continue flushing TID.  Change dressing q shift or as needed.  Please call IR if difficulty flushing or sudden change in output.   IR to continue to follow.     Electronically Signed: Tyson Alias, NP 06/07/2021, 4:04 PM   I spent a total of 15 Minutes at the the patient's bedside AND on the patient's hospital floor  or unit, greater than 50% of which was counseling/coordinating care for RLQ abdominal drain.

## 2021-06-07 NOTE — Progress Notes (Signed)
Mobility Specialist Progress Note   06/07/21 1630  Mobility  Activity Ambulated independently in hallway  Level of Assistance Modified independent, requires aide device or extra time  Assistive Device  (IV Pole)  Distance Ambulated (ft) 550 ft  Activity Response Tolerated well  $Mobility charge 1 Mobility   Received pt in bed having no complaints and agreeable to mobility. Asymptomatic throughout ambulation, returned back to bed w/ call bell in reach and all needs met.  Holland Falling Mobility Specialist Phone Number [phone]

## 2021-06-08 DIAGNOSIS — K567 Ileus, unspecified: Secondary | ICD-10-CM | POA: Diagnosis not present

## 2021-06-08 MED ORDER — SODIUM CHLORIDE 0.9% FLUSH: 10.0000 mL | INTRAVENOUS | Status: DC | PRN

## 2021-06-08 MED ORDER — CHLORHEXIDINE GLUCONATE CLOTH 2 % EX PADS
6.0000 | MEDICATED_PAD | Freq: Every day | CUTANEOUS | Status: DC
  Administered 2021-06-08: 6 via TOPICAL

## 2021-06-08 MED ORDER — PIPERACILLIN-TAZOBACTAM IV (FOR PTA / DISCHARGE USE ONLY): 13.5000 g | INTRAVENOUS | 0 refills | Status: AC

## 2021-06-08 MED ORDER — SODIUM CHLORIDE 0.9% FLUSH
10.0000 mL | Freq: Two times a day (BID) | INTRAVENOUS | Status: DC
  Administered 2021-06-08: 10 mL

## 2021-06-08 NOTE — Plan of Care (Signed)
  Problem: Education: Goal: Knowledge of General Education information will improve Description: Including pain rating scale, medication(s)/side effects and non-pharmacologic comfort measures 06/08/2021 1903 by Rhoderick Moody, RN Outcome: Adequate for Discharge 06/08/2021 1903 by Rhoderick Moody, RN Outcome: Progressing   Problem: Health Behavior/Discharge Planning: Goal: Ability to manage health-related needs will improve 06/08/2021 1903 by Rhoderick Moody, RN Outcome: Adequate for Discharge 06/08/2021 1903 by Rhoderick Moody, RN Outcome: Progressing   Problem: Clinical Measurements: Goal: Ability to maintain clinical measurements within normal limits will improve 06/08/2021 1903 by Rhoderick Moody, RN Outcome: Adequate for Discharge 06/08/2021 1903 by Rhoderick Moody, RN Outcome: Progressing Goal: Will remain free from infection 06/08/2021 1903 by Rhoderick Moody, RN Outcome: Adequate for Discharge 06/08/2021 1903 by Rhoderick Moody, RN Outcome: Progressing Goal: Diagnostic test results will improve 06/08/2021 1903 by Rhoderick Moody, RN Outcome: Adequate for Discharge 06/08/2021 1903 by Rhoderick Moody, RN Outcome: Progressing Goal: Respiratory complications will improve 06/08/2021 1903 by Rhoderick Moody, RN Outcome: Adequate for Discharge 06/08/2021 1903 by Rhoderick Moody, RN Outcome: Progressing Goal: Cardiovascular complication will be avoided 06/08/2021 1903 by Rhoderick Moody, RN Outcome: Adequate for Discharge 06/08/2021 1903 by Rhoderick Moody, RN Outcome: Progressing   Problem: Activity: Goal: Risk for activity intolerance will decrease 06/08/2021 1903 by Rhoderick Moody, RN Outcome: Adequate for Discharge 06/08/2021 1903 by Rhoderick Moody, RN Outcome: Progressing   Problem: Nutrition: Goal: Adequate nutrition will be maintained 06/08/2021 1903 by Rhoderick Moody, RN Outcome: Adequate for  Discharge 06/08/2021 1903 by Rhoderick Moody, RN Outcome: Progressing   Problem: Coping: Goal: Level of anxiety will decrease 06/08/2021 1903 by Rhoderick Moody, RN Outcome: Adequate for Discharge 06/08/2021 1903 by Rhoderick Moody, RN Outcome: Progressing   Problem: Elimination: Goal: Will not experience complications related to bowel motility 06/08/2021 1903 by Rhoderick Moody, RN Outcome: Adequate for Discharge 06/08/2021 1903 by Rhoderick Moody, RN Outcome: Progressing Goal: Will not experience complications related to urinary retention 06/08/2021 1903 by Rhoderick Moody, RN Outcome: Adequate for Discharge 06/08/2021 1903 by Rhoderick Moody, RN Outcome: Progressing   Problem: Pain Managment: Goal: General experience of comfort will improve 06/08/2021 1903 by Rhoderick Moody, RN Outcome: Adequate for Discharge 06/08/2021 1903 by Rhoderick Moody, RN Outcome: Progressing   Problem: Safety: Goal: Ability to remain free from injury will improve 06/08/2021 1903 by Rhoderick Moody, RN Outcome: Adequate for Discharge 06/08/2021 1903 by Rhoderick Moody, RN Outcome: Progressing   Problem: Skin Integrity: Goal: Risk for impaired skin integrity will decrease 06/08/2021 1903 by Rhoderick Moody, RN Outcome: Adequate for Discharge 06/08/2021 1903 by Rhoderick Moody, RN Outcome: Progressing

## 2021-06-08 NOTE — Progress Notes (Signed)
At bedside to place PICC, requesting that I come back later, around 11 am. States, she is so tired. Doreatha Massed keeps waking me up. Will continue to monitor.

## 2021-06-08 NOTE — Progress Notes (Signed)
Peripherally Inserted Central Catheter Placement  The IV Nurse has discussed with the patient and/or persons authorized to consent for the patient, the purpose of this procedure and the potential benefits and risks involved with this procedure.  The benefits include less needle sticks, lab draws from the catheter, and the patient may be discharged home with the catheter. Risks include, but not limited to, infection, bleeding, blood clot (thrombus formation), and puncture of an artery; nerve damage and irregular heartbeat and possibility to perform a PICC exchange if needed/ordered by physician.  Alternatives to this procedure were also discussed.  [employer] PICC patient education guide, fact sheet on infection prevention and patient information card has been provided to patient /or left at bedside.    PICC Placement Documentation  PICC Single Lumen 29/92/42 Right Basilic 35 cm 0 cm (Active)  Indication for Insertion or Continuance of Line Home intravenous therapies (PICC only) 06/08/21 1157  Exposed Catheter (cm) 0 cm 06/08/21 1157  Site Assessment Clean, Dry, Intact 06/08/21 1157  Line Status Flushed;Saline locked;Blood return noted 06/08/21 1157  Dressing Type Transparent;Securing device 06/08/21 1157  Dressing Status Antimicrobial disc in place 06/08/21 1157  Dressing Intervention New dressing;Other (Comment) 06/08/21 1157  Dressing Change Due 06/15/21 06/08/21 1157       Jule Economy Horton 06/08/2021, 12:03 PM

## 2021-06-08 NOTE — Progress Notes (Signed)
Progress Note     Subjective: Frustrated because people keep waking her up.   Objective: Vital signs in last 24 hours: Temp:  [98.3 F (36.8 C)-99.1 F (37.3 C)] 98.6 F (37 C) (05/23 0841) Pulse Rate:  [75-100] 78 (05/23 0841) Resp:  [17-18] 17 (05/23 0841) BP: (109-118)/(62-78) 109/64 (05/23 0841) SpO2:  [99 %-100 %] 99 % (05/23 0841) Last BM Date : 06/07/21  Intake/Output from previous day: 05/22 0701 - 05/23 0700 In: 5  Out: 18 [Drains:18] Intake/Output this shift: No intake/output data recorded.  PE: General: pleasant, WD, WN female who is laying in bed in NAD Heart: regular, rate, and rhythm.   Lungs: CTAB, no wheezes, rhonchi, or rales noted.  Respiratory effort nonlabored Abd: soft, mild ttp over drain in RLQ, ND, +BS MS: all 4 extremities are symmetrical with no cyanosis, clubbing, or edema. Skin: warm and dry with no masses, lesions, or rashes Neuro: Cranial nerves 2-12 grossly intact, sensation is normal throughout Psych: A&Ox3 with an appropriate affect.    Lab Results:  Recent Labs    06/06/21 1945 06/07/21 0751  WBC 13.9* 11.0*  HGB 11.3* 9.2*  HCT 33.6* 27.1*  PLT 311 327    BMET Recent Labs    06/06/21 1945 06/07/21 0751  NA 134* 137  K 3.6 3.6  CL 104 107  CO2 22 22  GLUCOSE 81 81  BUN 5* 5*  CREATININE 0.44 0.50  CALCIUM 9.0 8.5*    PT/INR No results for input(s): LABPROT, INR in the last 72 hours. CMP     Component Value Date/Time   NA 137 06/07/2021 0751   K 3.6 06/07/2021 0751   CL 107 06/07/2021 0751   CO2 22 06/07/2021 0751   GLUCOSE 81 06/07/2021 0751   BUN 5 (L) 06/07/2021 0751   CREATININE 0.50 06/07/2021 0751   CREATININE 0.47 (L) 06/04/2021 1511   CALCIUM 8.5 (L) 06/07/2021 0751   PROT 5.5 (L) 06/07/2021 0751   ALBUMIN 2.6 (L) 06/07/2021 0751   AST 13 (L) 06/07/2021 0751   ALT 16 06/07/2021 0751   ALKPHOS 45 06/07/2021 0751   BILITOT 0.7 06/07/2021 0751   GFRNONAA >60 06/07/2021 0751   GFRAA >60  02/07/2019 1830   Lipase     Component Value Date/Time   LIPASE 23 05/24/2021 0039       Studies/Results: MR PELVIS WO CONTRAST  Result Date: 06/07/2021 CLINICAL DATA:  Right lower quadrant abdominal pain. History of perforated appendicitis status post surgery 73/53/2992 complicated by abscess formation and drain placement on 05/31/2021. EXAM: MRI ABDOMEN AND PELVIS WITHOUT CONTRAST TECHNIQUE: Multiplanar multisequence MR imaging of the abdomen and pelvis was performed. No intravenous contrast was administered. COMPARISON:  05/29/2021, 05/24/2021 FINDINGS: COMBINED FINDINGS FOR BOTH MR ABDOMEN AND PELVIS Lower chest: No acute findings. Hepatobiliary: Normal hepatic parenchymal signal intensity. No focal intrahepatic masses are seen. No intra or extrahepatic biliary ductal dilation. Normal flow voids within the portal vein. Gallbladder unremarkable. Pancreas: No mass, inflammatory changes, or other parenchymal abnormality identified. Spleen:  Within normal limits in size and appearance. Adrenals/Urinary Tract: The adrenal glands are unremarkable. The kidneys are normal in size and position. 5 mm simple cortical cyst within the upper pole of the left kidney. No hydronephrosis. No intrarenal masses. The bladder is unremarkable. Stomach/Bowel: Since the prior examination, percutaneous drainage catheter has been placed within the retrocecal fluid collection which has been largely evacuated. No significant residual fluid component is identified. There is residual inflammatory change within this  region as well as a trace amount of layering fluid within the right pericolic gutter. Small amount of subcutaneous fluid is seen within the right lateral abdominal wall, improved since prior examination. A separate 3.7 x 1.7 cm loculated fluid collection is seen within the cul-de-sac, best seen on image # 17, series 5. The stomach, small bowel, and large bowel are otherwise unremarkable. Surgical changes of  appendectomy are again identified. Vascular/Lymphatic: No pathologically enlarged lymph nodes identified. No abdominal aortic aneurysm demonstrated. Reproductive: Gravid uterus with single fetus again identified, not optimally assessed on this exam. Other: Edema within the periumbilical region likely relates to recent surgery. No discrete drainable fluid collection. Musculoskeletal: No suspicious bone lesions identified. IMPRESSION: Interval percutaneous drainage and evacuation of retrocecal fluid collection with no significant residual fluid component identified. Persistent mild inflammatory changes within the region as well as trace layering fluid within the right pericolic gutter. Improving fluid within the right lateral abdominal wall, likely postsurgical in nature. New loculated 3.7 cm fluid collection within the cul-de-sac. Surgical changes of appendectomy identified. Electronically Signed   By: Fidela Salisbury M.D.   On: 06/07/2021 02:32   MR ABDOMEN WO CONTRAST  Result Date: 06/07/2021 CLINICAL DATA:  Right lower quadrant abdominal pain. History of perforated appendicitis status post surgery 97/02/6376 complicated by abscess formation and drain placement on 05/31/2021. EXAM: MRI ABDOMEN AND PELVIS WITHOUT CONTRAST TECHNIQUE: Multiplanar multisequence MR imaging of the abdomen and pelvis was performed. No intravenous contrast was administered. COMPARISON:  05/29/2021, 05/24/2021 FINDINGS: COMBINED FINDINGS FOR BOTH MR ABDOMEN AND PELVIS Lower chest: No acute findings. Hepatobiliary: Normal hepatic parenchymal signal intensity. No focal intrahepatic masses are seen. No intra or extrahepatic biliary ductal dilation. Normal flow voids within the portal vein. Gallbladder unremarkable. Pancreas: No mass, inflammatory changes, or other parenchymal abnormality identified. Spleen:  Within normal limits in size and appearance. Adrenals/Urinary Tract: The adrenal glands are unremarkable. The kidneys are normal in  size and position. 5 mm simple cortical cyst within the upper pole of the left kidney. No hydronephrosis. No intrarenal masses. The bladder is unremarkable. Stomach/Bowel: Since the prior examination, percutaneous drainage catheter has been placed within the retrocecal fluid collection which has been largely evacuated. No significant residual fluid component is identified. There is residual inflammatory change within this region as well as a trace amount of layering fluid within the right pericolic gutter. Small amount of subcutaneous fluid is seen within the right lateral abdominal wall, improved since prior examination. A separate 3.7 x 1.7 cm loculated fluid collection is seen within the cul-de-sac, best seen on image # 17, series 5. The stomach, small bowel, and large bowel are otherwise unremarkable. Surgical changes of appendectomy are again identified. Vascular/Lymphatic: No pathologically enlarged lymph nodes identified. No abdominal aortic aneurysm demonstrated. Reproductive: Gravid uterus with single fetus again identified, not optimally assessed on this exam. Other: Edema within the periumbilical region likely relates to recent surgery. No discrete drainable fluid collection. Musculoskeletal: No suspicious bone lesions identified. IMPRESSION: Interval percutaneous drainage and evacuation of retrocecal fluid collection with no significant residual fluid component identified. Persistent mild inflammatory changes within the region as well as trace layering fluid within the right pericolic gutter. Improving fluid within the right lateral abdominal wall, likely postsurgical in nature. New loculated 3.7 cm fluid collection within the cul-de-sac. Surgical changes of appendectomy identified. Electronically Signed   By: Fidela Salisbury M.D.   On: 06/07/2021 02:32   [place] EKG SITE RITE  Result Date: 06/07/2021 If [employer]  not attached, placement could not be confirmed due to current cardiac rhythm.    Anti-infectives: Anti-infectives (From admission, onward)    Start     Dose/Rate Route Frequency Ordered Stop   06/06/21 2030  piperacillin-tazobactam (ZOSYN) IVPB 3.375 g        3.375 g 12.5 mL/hr over 240 Minutes Intravenous Every 8 hours 06/06/21 2019          Assessment/Plan Recent perforated gangrenous appendicitis s/p lap appendectomy 05/24/21 Dr. Brantley Stage Post-op abscess s/p IR drain placement 5/15 Recurrent nausea and vomiting  - Cxs from IR drain grew pseudomonas, ID recommended 4 weeks zosyn and patient was scheduled for PICC placement - WBC 11 from 13.9, HD stable and afebrile - No plans for further drain placement. Patient may discharge as soon as PICC and home IV abx arranged. Unfortunately she turned away the PICC team this morning because she wanted to sleep.   FEN: reg VTE: SQH ID: Zosyn 5/21>>  [id number] pregnancy - MAU following     LOS: 2 days     CClovis Riley MD [facility] 06/08/2021, 9:07 AM Please see Amion for pager number during day hours 7:00am-4:30pm

## 2021-06-08 NOTE — Progress Notes (Signed)
Pt request for IV fluids to be D/C. Pt has been eating well with co complaints, as per MD order IV D/C

## 2021-06-08 NOTE — Discharge Summary (Signed)
[facility] Discharge Summary   Patient ID: Tina Gibbs MRN: [id number] DOB/AGE: 09-02-2000 21 y.o.  Admit date: 06/06/2021 Discharge date: 06/08/2021  Admitting Diagnosis: Perforated appendicitis s/p laparoscopic appendectomy Post-operative abscess s/p IR drain placement  Nausea and vomiting  RLQ pain  14 weeks 1 day gestation  Discharge Diagnosis Perforated appendicitis s/p laparoscopic appendectomy Post-operative abscess s/p IR drain placement  Nausea and vomiting  RLQ pain  14 weeks 3 days gestation  Consultants IR ID  Imaging: MR PELVIS WO CONTRAST  Result Date: 06/07/2021 CLINICAL DATA:  Right lower quadrant abdominal pain. History of perforated appendicitis status post surgery 59/56/3875 complicated by abscess formation and drain placement on 05/31/2021. EXAM: MRI ABDOMEN AND PELVIS WITHOUT CONTRAST TECHNIQUE: Multiplanar multisequence MR imaging of the abdomen and pelvis was performed. No intravenous contrast was administered. COMPARISON:  05/29/2021, 05/24/2021 FINDINGS: COMBINED FINDINGS FOR BOTH MR ABDOMEN AND PELVIS Lower chest: No acute findings. Hepatobiliary: Normal hepatic parenchymal signal intensity. No focal intrahepatic masses are seen. No intra or extrahepatic biliary ductal dilation. Normal flow voids within the portal vein. Gallbladder unremarkable. Pancreas: No mass, inflammatory changes, or other parenchymal abnormality identified. Spleen:  Within normal limits in size and appearance. Adrenals/Urinary Tract: The adrenal glands are unremarkable. The kidneys are normal in size and position. 5 mm simple cortical cyst within the upper pole of the left kidney. No hydronephrosis. No intrarenal masses. The bladder is unremarkable. Stomach/Bowel: Since the prior examination, percutaneous drainage catheter has been placed within the retrocecal fluid collection which has been largely evacuated. No significant residual fluid component is identified. There  is residual inflammatory change within this region as well as a trace amount of layering fluid within the right pericolic gutter. Small amount of subcutaneous fluid is seen within the right lateral abdominal wall, improved since prior examination. A separate 3.7 x 1.7 cm loculated fluid collection is seen within the cul-de-sac, best seen on image # 17, series 5. The stomach, small bowel, and large bowel are otherwise unremarkable. Surgical changes of appendectomy are again identified. Vascular/Lymphatic: No pathologically enlarged lymph nodes identified. No abdominal aortic aneurysm demonstrated. Reproductive: Gravid uterus with single fetus again identified, not optimally assessed on this exam. Other: Edema within the periumbilical region likely relates to recent surgery. No discrete drainable fluid collection. Musculoskeletal: No suspicious bone lesions identified. IMPRESSION: Interval percutaneous drainage and evacuation of retrocecal fluid collection with no significant residual fluid component identified. Persistent mild inflammatory changes within the region as well as trace layering fluid within the right pericolic gutter. Improving fluid within the right lateral abdominal wall, likely postsurgical in nature. New loculated 3.7 cm fluid collection within the cul-de-sac. Surgical changes of appendectomy identified. Electronically Signed   By: Fidela Salisbury M.D.   On: 06/07/2021 02:32   MR ABDOMEN WO CONTRAST  Result Date: 06/07/2021 CLINICAL DATA:  Right lower quadrant abdominal pain. History of perforated appendicitis status post surgery 64/33/2951 complicated by abscess formation and drain placement on 05/31/2021. EXAM: MRI ABDOMEN AND PELVIS WITHOUT CONTRAST TECHNIQUE: Multiplanar multisequence MR imaging of the abdomen and pelvis was performed. No intravenous contrast was administered. COMPARISON:  05/29/2021, 05/24/2021 FINDINGS: COMBINED FINDINGS FOR BOTH MR ABDOMEN AND PELVIS Lower chest: No acute  findings. Hepatobiliary: Normal hepatic parenchymal signal intensity. No focal intrahepatic masses are seen. No intra or extrahepatic biliary ductal dilation. Normal flow voids within the portal vein. Gallbladder unremarkable. Pancreas: No mass, inflammatory changes, or other parenchymal abnormality identified. Spleen:  Within normal limits in size and appearance. Adrenals/Urinary  Tract: The adrenal glands are unremarkable. The kidneys are normal in size and position. 5 mm simple cortical cyst within the upper pole of the left kidney. No hydronephrosis. No intrarenal masses. The bladder is unremarkable. Stomach/Bowel: Since the prior examination, percutaneous drainage catheter has been placed within the retrocecal fluid collection which has been largely evacuated. No significant residual fluid component is identified. There is residual inflammatory change within this region as well as a trace amount of layering fluid within the right pericolic gutter. Small amount of subcutaneous fluid is seen within the right lateral abdominal wall, improved since prior examination. A separate 3.7 x 1.7 cm loculated fluid collection is seen within the cul-de-sac, best seen on image # 17, series 5. The stomach, small bowel, and large bowel are otherwise unremarkable. Surgical changes of appendectomy are again identified. Vascular/Lymphatic: No pathologically enlarged lymph nodes identified. No abdominal aortic aneurysm demonstrated. Reproductive: Gravid uterus with single fetus again identified, not optimally assessed on this exam. Other: Edema within the periumbilical region likely relates to recent surgery. No discrete drainable fluid collection. Musculoskeletal: No suspicious bone lesions identified. IMPRESSION: Interval percutaneous drainage and evacuation of retrocecal fluid collection with no significant residual fluid component identified. Persistent mild inflammatory changes within the region as well as trace layering fluid  within the right pericolic gutter. Improving fluid within the right lateral abdominal wall, likely postsurgical in nature. New loculated 3.7 cm fluid collection within the cul-de-sac. Surgical changes of appendectomy identified. Electronically Signed   By: Fidela Salisbury M.D.   On: 06/07/2021 02:32   [place] EKG SITE RITE  Result Date: 06/07/2021 If [facility] image not attached, placement could not be confirmed due to current cardiac rhythm.   Procedures Dr. Marcello Moores Cornett (05/24/21) - Laparoscopic Appendectomy Dr. Wille Glaser [place]weru (05/31/21) - CT guided drain placement   Hospital Course:  Patient is a 21 year old female s/p laparoscopic appendectomy for perforated appendicitis who developed post-op abscess which was managed with IR drain. She presented to the ED with increased RLQ abdominal pain and nausea and vomiting.  Cultures from drained material had come back with Pseudomonas aeruginosa and patient was in the process of trying to get PICC placed for outpatient IV Zosyn therapy recommended by ID. Patient was admitted and underwent PICC placement. Nausea and vomiting resolved.  Diet was advanced as tolerated. ID saw patient while admitted and recommended 4 weeks IV Zosyn, this was arranged prior to discharge. IR consulted as well and removed prior drain since collection appeared resolved on repeat MRI. New collection in right pelvis felt to be too small to drain. On 06/08/21 the patient was voiding well, tolerating diet, ambulating well, pain well controlled, vital signs stable, incisions c/d/i and felt stable for discharge home.  Patient will follow up in our office in 2 weeks and knows to call with questions or concerns.   I or a member of my team have reviewed this patient in the Controlled Substance Database.   Allergies as of 06/08/2021       Reactions   Latex Hives, Itching   Peanut-containing Drug Products Itching        Medication List     STOP taking these medications    Normal Saline  Flush 0.9 % Soln   ondansetron 8 MG disintegrating tablet Commonly known as: Zofran ODT   oxyCODONE 5 MG immediate release tablet Commonly known as: Oxy IR/ROXICODONE       TAKE these medications    acetaminophen 325 MG tablet Commonly  known as: TYLENOL Take 2 tablets (650 mg total) by mouth every 4 (four) hours as needed for mild pain (or temp > 100). What changed:  how much to take when to take this reasons to take this   famotidine 20 MG tablet Commonly known as: Pepcid Take 1 tablet (20 mg total) by mouth daily. What changed:  when to take this reasons to take this   metoCLOPramide 10 MG tablet Commonly known as: REGLAN Take 1 tablet (10 mg total) by mouth 3 (three) times daily with meals. What changed:  when to take this reasons to take this   piperacillin-tazobactam  IVPB Commonly known as: ZOSYN Inject 13.5 g into the vein daily for 27 days. As a continuous Infusion Indication:  abdominal abscess First Dose: Yes Last Day of Therapy:  07/05/2021 Labs - Once weekly:  CBC/D and BMP, Labs - Every other week:  ESR and CRP Method of administration: Elastomeric (Continuous infusion) Method of administration may be changed at the discretion of home infusion pharmacist based upon assessment of the patient and/or caregiver's ability to self-administer the medication ordered.   Prenatabs Rx 29-1 MG Tabs Take 1 tablet by mouth daily.               Discharge Care Instructions  (From admission, onward)           Start     Ordered   06/08/21 0000  Change dressing on IV access line weekly and PRN  (Home infusion instructions - [facility] )        06/08/21 1345              Follow-up Information     Erroll Luna, MD. Go on 06/18/2021.   Specialty: General Surgery Why: 11:30 AM. Please arrive 20 min prior to appointment time to check in. Bring photo ID and insurance information with you. Contact information: [address] [place] [address] [phone]         Gynecology, [facility]. Schedule an appointment as soon as possible for a visit.   Specialty: Obstetrics and Gynecology Contact information: [place] [address] [phone]                 Signed: Norm Parcel , [facility] 06/08/2021, 1:45 PM Please see Amion for pager number during day hours 7:00am-4:30pm

## 2021-06-08 NOTE — Progress Notes (Signed)
PHARMACY CONSULT NOTE FOR:  OUTPATIENT  PARENTERAL ANTIBIOTIC THERAPY (OPAT)  Indication: abdominal abscess Regimen: Ceftriaxone 13.5 g IV q 24 h as a continuous infusion End date: 07/05/2021  IV antibiotic discharge orders are pended. To discharging provider:  please sign these orders via discharge navigator,  Select New Orders & click on the button choice - Manage This Unsigned Work.     Thank you for involving pharmacy in this patient's care.  Elita Quick, PharmD PGY1 Ambulatory Care Pharmacy Resident 06/08/2021 10:28 AM  **Pharmacist phone directory can be found on [email or web address] listed under [place]**

## 2021-06-08 NOTE — Progress Notes (Signed)
Mobility Specialist Progress Note:   06/08/21 1000  Mobility  Activity Ambulated independently to bathroom  Level of Assistance Independent  Assistive Device None  Distance Ambulated (ft) 30 ft  Activity Response Tolerated well  $Mobility charge 1 Mobility   Pt received "annoyed' that she didn't get any rest last night. Ambulated independently to BR. Left with all needs met.   Nelta Numbers Acute Rehab Secure Chat or Office Phone: [phone]

## 2021-06-08 NOTE — TOC Progression Note (Signed)
Transition of Care [facility]) - Progression Note    Patient Details  Name: Tina Gibbs MRN: [id number] Date of Birth: [birth year]  Transition of Care [facility]) CM/SW Contact  Jacalyn Lefevre Edson Snowball, RN Phone Number: 06/08/2021, 10:43 AM  Clinical Narrative:     For discharge today . Patient will need PIC placed . Left message for Pam with [employer]   Expected Discharge Plan: [place] Barriers to Discharge: Continued Medical Work up  Expected Discharge Plan and Services Expected Discharge Plan: [place] Choice: [place] arrangements for the past 2 months: Single Family Home                 DME Arranged: N/A DME Agency: NA         HH Agency:  ([facility])         Social Determinants of Health (SDOH) Interventions    Readmission Risk Interventions     View : No data to display.

## 2021-06-08 NOTE — Progress Notes (Signed)
[place] for Infectious Disease  Date of Admission:  06/06/2021   Total days of inpatient antibiotics 3  Principal Problem:   Ileus ([place]) Active Problems:   Acute perforated appendicitis   Right lower quadrant abdominal abscess (HCC)          Assessment: 20 YF G3P1001 at 13 weeks admitted with pelvic abscess SP drain placement with Cx+ Pseudomonas aerginosa. #Pelvis Abscess SP RLQ drain placement with Cx+ PsA on 5/15 #New Pelvic abscess -Seen by outpatient on 5/19 and plan to start pip-tazo x 4 weeks -Admitted due to worsening N/V. MR abdomen and pelvis on 5/22 showed previous fluid collection largely evacuation where drain placed, separate 3.7x1.7 cm loculated fluid collection in cul-de-sac - Started on pip-tazo - IR engaged per new fluid collection  and it is too small to drain.  Recommendations:  -Engage IR to evaluate old drain -Please place PICC -Will need at least 4 weeks of IV antibiocis -Consider ob/gyn consult for 12 week check-up(pt missed)  OPAT ORDERS:   Diagnosis: Pelvic abscess   Culture Result: PsA       Allergies  Allergen Reactions   Latex Hives and Itching   Peanut-Containing Drug Products Itching        Discharge antibiotics to be given via PICC line:   Per pharmacy protocol Pip-tazo 13.5 gm q24h       Duration: 4 weeks End Date: 07/05/21   [facility] Care Per Protocol with [facility] Use: Home health RN for IV administration and teaching, line care and labs.     Labs weekly while on IV antibiotics: __ CBC with differential __ CMP __ CRP __ ESR __ CK   __ Please pull PIC at completion of IV antibiotics     Clinic Follow Up Appt: June,7th   @ [facility] with  Dr. Candiss Norse  Microbiology:   Antibiotics: Pip-tazo-5/21-p   Cultures: 5/15Drain Cx+ PsA    SUBJECTIVE: Resting in bed. She is upset that she keeps getting woken up.                                      Review of Systems: Review of Systems  All other systems  reviewed and are negative.   Scheduled Meds:  heparin  5,000 Units Subcutaneous Q8H   prenatal multivitamin  1 tablet Oral Daily   Continuous Infusions:  lactated ringers 125 mL/hr at 06/08/21 0408   piperacillin-tazobactam (ZOSYN)  IV 3.375 g (06/08/21 0534)   PRN Meds:.acetaminophen **OR** acetaminophen, diphenhydrAMINE, metoCLOPramide (REGLAN) injection, morphine injection, ondansetron **OR** ondansetron (ZOFRAN) IV, oxyCODONE, simethicone Allergies  Allergen Reactions   Latex Hives and Itching   Peanut-Containing Drug Products Itching    OBJECTIVE: Vitals:   06/07/21 1857 06/07/21 2032 06/08/21 0405 06/08/21 0841  BP: 117/68 113/67 110/62 109/64  Pulse: 100 96 75 78  Resp: 18 18 17 17   Temp: 98.4 F (36.9 C) 99.1 F (37.3 C) 98.4 F (36.9 C) 98.6 F (37 C)  TempSrc: Oral Oral Oral Oral  SpO2: 100% 99% 99% 99%  Weight:      Height:       Body mass index is 26.52 kg/m.  Physical Exam Constitutional:      Appearance: Normal appearance.  HENT:     Head: Normocephalic and atraumatic.     Right Ear: Tympanic membrane normal.     Left Ear: Tympanic membrane normal.  Nose: Nose normal.     Mouth/Throat:     Mouth: Mucous membranes are moist.  Eyes:     Extraocular Movements: Extraocular movements intact.     Conjunctiva/sclera: Conjunctivae normal.     Pupils: Pupils are equal, round, and reactive to light.  Cardiovascular:     Rate and Rhythm: Normal rate and regular rhythm.     Heart sounds: No murmur heard.   No friction rub. No gallop.  Pulmonary:     Effort: Pulmonary effort is normal.     Breath sounds: Normal breath sounds.  Abdominal:     General: Abdomen is flat.     Palpations: Abdomen is soft.     Comments: RLQ drain in place  Musculoskeletal:        General: Normal range of motion.  Skin:    General: Skin is warm and dry.  Neurological:     General: No focal deficit present.     Mental Status: She is alert and oriented to person, place,  and time.  Psychiatric:        Mood and Affect: Mood normal.      Lab Results Lab Results  Component Value Date   WBC 11.0 (H) 06/07/2021   HGB 9.2 (L) 06/07/2021   HCT 27.1 (L) 06/07/2021   MCV 95.1 06/07/2021   PLT 327 06/07/2021    Lab Results  Component Value Date   CREATININE 0.50 06/07/2021   BUN 5 (L) 06/07/2021   NA 137 06/07/2021   K 3.6 06/07/2021   CL 107 06/07/2021   CO2 22 06/07/2021    Lab Results  Component Value Date   ALT 16 06/07/2021   AST 13 (L) 06/07/2021   ALKPHOS 45 06/07/2021   BILITOT 0.7 06/07/2021        Laurice Record, [place] for Infectious Disease [facility] 06/08/2021, 10:48 AM

## 2021-06-08 NOTE — Progress Notes (Signed)
   Acute perforated appendicitis, s/p RLQ drain placement 05/31/21 with Dr. Maryelizabeth Kaufmann. Organism ID, Bacteria PSEUDOMONAS AERUGINOSA    Pt is afebrile Wbc 11 and trending down OP less than 15 cc x 36 hours Zosyn IV  Plans for DC today Team asking about drain removal  Discussed with Dr Annamaria Boots He has reviewed imaging and pt status  Removal today without complication Dressing placed

## 2021-06-09 ENCOUNTER — Other Ambulatory Visit (HOSPITAL_COMMUNITY): Payer: Self-pay

## 2021-06-09 ENCOUNTER — Telehealth: Payer: Self-pay

## 2021-06-09 NOTE — Telephone Encounter (Signed)
Transition Care Management Follow-up Telephone Call Date of discharge and from where: 06/08/2021, [facility] How have you been since you were released from the hospital? She said she is okay, feeling much better now that she is on the correct antibiotic. She also said that  the right side abdominal pain has decreased.  Any questions or concerns? No  Items Reviewed: Did the pt receive and understand the discharge instructions provided? Yes  Medications obtained and verified? Yes - she said she has all of her medications. She successfully administered her first dose of zosyn this morning with the assistance of her fiance. The home health nurse will do the weekly PICC dressing changes.  Other? No  Any new allergies since your discharge? No  Dietary orders reviewed? Yes Do you have support at home? Yes , her fiance  Home Care and Equipment/Supplies: Were home health services ordered? yes If so, what is the name of the agency? [facility]  Has the agency set up a time to come to the patient's home? Yes - the nurse is coming to see her this afternoon.  Were any new equipment or medical supplies ordered?  Yes: IV infusion supplies and PICC dressing supplies What is the name of the medical supply agency? [employer] Were you able to get the supplies/equipment? yes Do you have any questions related to the use of the equipment or supplies? No  Functional Questionnaire: (I = Independent and D = Dependent) ADLs: independent    Follow up appointments reviewed:  PCP Hospital f/u appt confirmed?  She said she will call to schedule a physical after she completes her specialty appointments   [facility] f/u appt confirmed? Yes  Scheduled to see Surgeon - 06/18/2021; [facility]- 06/23/2021 and she is waiting for a call from OB/GYN to schedule her initial appointment . She said her drain is out so she does not need the appointment with IR. Are transportation arrangements needed?  She said she relies on  her grandfather to take her to appointments.  I provided her with the phone number for [facility] Medicaid transportation.  If their condition worsens, is the pt aware to call PCP or go to the Emergency Dept.? Yes Was the patient provided with contact information for the PCP's office or ED? Yes Was to pt encouraged to call back with questions or concerns? Yes

## 2021-06-11 ENCOUNTER — Other Ambulatory Visit: Payer: Medicaid Other

## 2021-06-17 LAB — OB RESULTS CONSOLE HIV ANTIBODY (ROUTINE TESTING): HIV: NONREACTIVE

## 2021-06-17 LAB — OB RESULTS CONSOLE HEPATITIS B SURFACE ANTIGEN: Hepatitis B Surface Ag: NEGATIVE

## 2021-06-23 ENCOUNTER — Other Ambulatory Visit: Payer: Self-pay

## 2021-06-23 ENCOUNTER — Encounter: Payer: Self-pay | Admitting: Internal Medicine

## 2021-06-23 ENCOUNTER — Ambulatory Visit (INDEPENDENT_AMBULATORY_CARE_PROVIDER_SITE_OTHER): Payer: Medicaid Other | Admitting: Internal Medicine

## 2021-06-23 VITALS — BP 115/76 | HR 112 | Resp 16 | Ht 60.0 in | Wt 145.0 lb

## 2021-06-23 DIAGNOSIS — N739 Female pelvic inflammatory disease, unspecified: Secondary | ICD-10-CM
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
## 2021-06-28 ENCOUNTER — Telehealth: Payer: Self-pay

## 2021-06-28 NOTE — Telephone Encounter (Signed)
Home Health RN Lenna Sciara (cell [phone]) called on patient's behalf, stated patient has a raised, red, flaky rash localized in armpits, under breasts, and in groin area. States rash began after 6/5, but prior to patient's recent appointment at [facility] on 6/7. States vitals WDL today.  RN states patient contacted her OBGYN, and HH reached out to [employer]. Both providers directed patient back to ID.   Please advise - RN requested rx for yeast infection.  Will route to provider.  Binnie Kand, RN

## 2021-06-28 NOTE — Telephone Encounter (Signed)
Spoke to patient, who confirmed information from [facility] RN and requested rx for diflucan. Patient unable to come in to clinic prior to appointment on 6/19 due to issues with transportation. Patient willing to do a virtual visit if needed.  Binnie Kand, RN

## 2021-06-29 NOTE — Telephone Encounter (Signed)
[place] nurse and relayed to her that it will be ok for patient to have virtual visit. HH will communicate with patient and provide patient with office phone number to schedule. Staff was not able to reach patient directly with phone number listed.

## 2021-07-05 ENCOUNTER — Telehealth: Payer: Self-pay

## 2021-07-05 ENCOUNTER — Other Ambulatory Visit: Payer: Self-pay

## 2021-07-05 ENCOUNTER — Telehealth (INDEPENDENT_AMBULATORY_CARE_PROVIDER_SITE_OTHER): Payer: Medicaid Other | Admitting: Internal Medicine

## 2021-07-05 DIAGNOSIS — N739 Female pelvic inflammatory disease, unspecified: Secondary | ICD-10-CM | POA: Diagnosis not present

## 2021-07-05 MED ORDER — MICONAZOLE NITRATE 2 % VA CREA: 1.0000 | TOPICAL_CREAM | Freq: Every day | VAGINAL | 0 refills | Status: AC

## 2021-07-05 NOTE — Telephone Encounter (Signed)
Per MD extend patient IV antibiotics to 07/16/21, sent a community message to [place] about opat updates.
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
## 2021-07-06 NOTE — Telephone Encounter (Signed)
Received the following message from Carolynn Sayers at [facility].  Connected with Melissa and she will reach out to the patient  Pt shared she was ignoring [facility] calls because she did not want to continue IVABX.  Per Lenna Sciara pt does understand need for and is willing to have the MRI.  Lenna Sciara is calling her tonight to direct her to call the clinic in the AM to work with you, Aniceto Boss, to schedule.   Thanks all.  Pam  Will update provider and front desk will reach out to reschedule appointment.

## 2021-08-03 ENCOUNTER — Ambulatory Visit: Payer: Medicaid Other | Admitting: Internal Medicine

## 2021-08-15 ENCOUNTER — Observation Stay (HOSPITAL_COMMUNITY)
Admission: AD | Admit: 2021-08-15 | Discharge: 2021-08-17 | Disposition: A | Discharge status: Home / Self Care | Payer: Medicaid Other | Attending: Obstetrics & Gynecology | Admitting: Obstetrics & Gynecology

## 2021-08-15 ENCOUNTER — Inpatient Hospital Stay (HOSPITAL_COMMUNITY): Payer: Medicaid Other

## 2021-08-15 ENCOUNTER — Encounter (HOSPITAL_COMMUNITY): Payer: Self-pay | Admitting: Obstetrics and Gynecology

## 2021-08-15 DIAGNOSIS — O99512 Diseases of the respiratory system complicating pregnancy, second trimester: Secondary | ICD-10-CM | POA: Diagnosis not present

## 2021-08-15 DIAGNOSIS — O26892 Other specified pregnancy related conditions, second trimester: Secondary | ICD-10-CM | POA: Diagnosis present

## 2021-08-15 DIAGNOSIS — J45909 Unspecified asthma, uncomplicated: Secondary | ICD-10-CM | POA: Diagnosis not present

## 2021-08-15 DIAGNOSIS — Z79899 Other long term (current) drug therapy: Secondary | ICD-10-CM | POA: Diagnosis not present

## 2021-08-15 DIAGNOSIS — N1330 Unspecified hydronephrosis: Secondary | ICD-10-CM | POA: Insufficient documentation

## 2021-08-15 DIAGNOSIS — Z91010 Allergy to peanuts: Secondary | ICD-10-CM | POA: Insufficient documentation

## 2021-08-15 DIAGNOSIS — Z3A24 24 weeks gestation of pregnancy: Secondary | ICD-10-CM | POA: Diagnosis not present

## 2021-08-15 DIAGNOSIS — N200 Calculus of kidney: Secondary | ICD-10-CM

## 2021-08-15 DIAGNOSIS — Z91040 Latex allergy status: Secondary | ICD-10-CM | POA: Insufficient documentation

## 2021-08-15 DIAGNOSIS — O26832 Pregnancy related renal disease, second trimester: Secondary | ICD-10-CM | POA: Diagnosis not present

## 2021-08-15 DIAGNOSIS — Z8673 Personal history of transient ischemic attack (TIA), and cerebral infarction without residual deficits: Secondary | ICD-10-CM | POA: Diagnosis not present

## 2021-08-15 DIAGNOSIS — O2302 Infections of kidney in pregnancy, second trimester: Secondary | ICD-10-CM

## 2021-08-15 LAB — URINALYSIS, ROUTINE W REFLEX MICROSCOPIC
Bilirubin Urine: NEGATIVE
Glucose, UA: NEGATIVE mg/dL
Ketones, ur: 80 mg/dL — AB
Leukocytes,Ua: NEGATIVE
Nitrite: NEGATIVE
Protein, ur: 30 mg/dL — AB
RBC / HPF: >50 RBC/hpf — ABNORMAL HIGH (ref 0–5)
Specific Gravity, Urine: 1.015 (ref 1.005–1.030)
pH: 6 (ref 5.0–8.0)

## 2021-08-15 LAB — COMPREHENSIVE METABOLIC PANEL
ALT: 14 U/L (ref 0–44)
AST: 15 U/L (ref 15–41)
Albumin: 3.3 g/dL — ABNORMAL LOW (ref 3.5–5.0)
Alkaline Phosphatase: 56 U/L (ref 38–126)
Anion gap: 9 (ref 5–15)
BUN: 9 mg/dL (ref 6–20)
CO2: 23 mmol/L (ref 22–32)
Calcium: 9.4 mg/dL (ref 8.9–10.3)
Chloride: 107 mmol/L (ref 98–111)
Creatinine, Ser: 0.69 mg/dL (ref 0.44–1.00)
GFR, Estimated: >60 mL/min (ref >60)
Glucose, Bld: 115 mg/dL — ABNORMAL HIGH (ref 70–99)
Potassium: 3.8 mmol/L (ref 3.5–5.1)
Sodium: 139 mmol/L (ref 135–145)
Total Bilirubin: <0.1 mg/dL — ABNORMAL LOW (ref 0.3–1.2)
Total Protein: 6.5 g/dL (ref 6.5–8.1)

## 2021-08-15 LAB — CBC
HCT: 31.7 % — ABNORMAL LOW (ref 36.0–46.0)
Hemoglobin: 10.7 g/dL — ABNORMAL LOW (ref 12.0–15.0)
MCH: 32.6 pg (ref 26.0–34.0)
MCHC: 33.8 g/dL (ref 30.0–36.0)
MCV: 96.6 fL (ref 80.0–100.0)
Platelets: 273 10*3/uL (ref 150–400)
RBC: 3.28 MIL/uL — ABNORMAL LOW (ref 3.87–5.11)
RDW: 13.2 % (ref 11.5–15.5)
WBC: 20.5 10*3/uL — ABNORMAL HIGH (ref 4.0–10.5)
nRBC: 0 % (ref 0.0–0.2)

## 2021-08-15 LAB — WET PREP, GENITAL
Clue Cells Wet Prep HPF POC: NONE SEEN
Sperm: NONE SEEN
Trich, Wet Prep: NONE SEEN
WBC, Wet Prep HPF POC: <10 (ref <10)
Yeast Wet Prep HPF POC: NONE SEEN

## 2021-08-15 LAB — LIPASE, BLOOD: Lipase: 23 U/L (ref 11–51)

## 2021-08-15 MED ORDER — FENTANYL CITRATE (PF) 100 MCG/2ML IJ SOLN
100.0000 ug | INTRAMUSCULAR | Status: DC | PRN
  Administered 2021-08-16 (×4): 100 ug via INTRAVENOUS
  Filled 2021-08-15 (×6): qty 2

## 2021-08-15 MED ORDER — HYDROMORPHONE HCL 1 MG/ML IJ SOLN
1.0000 mg | Freq: Once | INTRAMUSCULAR | Status: AC
  Administered 2021-08-15: 1 mg via INTRAVENOUS
  Filled 2021-08-15: qty 1

## 2021-08-15 MED ORDER — LACTATED RINGERS IV BOLUS
1000.0000 mL | Freq: Once | INTRAVENOUS | Status: AC
  Administered 2021-08-15: 1000 mL via INTRAVENOUS

## 2021-08-15 MED ORDER — SODIUM CHLORIDE 0.9 % IV SOLN
25.0000 mg | Freq: Once | INTRAVENOUS | Status: AC
  Administered 2021-08-15: 25 mg via INTRAVENOUS
  Filled 2021-08-15: qty 1

## 2021-08-15 NOTE — MAU Note (Signed)
Unable to maintain continuous FHR tracing due to patient activity. Will trying following pain med administration.
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
## 2021-08-15 NOTE — MAU Note (Addendum)
..  Tina Gibbs is a 21 y.o. at 8w1dhere in MAU reporting: Right sided sharp abdominal pain that is constant and began last night. Reports burning with urination. Denies vaginal bleeding or leaking of fluid. +Flutters Reports she had two episodes of vomiting before coming in Took tylenol '1000mg'$  at 4pm and it did not help Onset of complaint: last night Pain score: 10/10 Vitals:   08/15/21 2050  BP: 134/72  Pulse: 71  Resp: 20  Temp: 97.9 F (36.6 C)  SpO2: 100%     FBOE:[id number] room 140's Lab orders placed from triage: UA

## 2021-08-16 ENCOUNTER — Other Ambulatory Visit: Payer: Self-pay

## 2021-08-16 DIAGNOSIS — Z3A24 24 weeks gestation of pregnancy: Secondary | ICD-10-CM

## 2021-08-16 DIAGNOSIS — N200 Calculus of kidney: Secondary | ICD-10-CM | POA: Diagnosis not present

## 2021-08-16 LAB — GC/CHLAMYDIA PROBE AMP (~~LOC~~) NOT AT ARMC
Chlamydia: NEGATIVE
Comment: NEGATIVE
Comment: NORMAL
Neisseria Gonorrhea: NEGATIVE

## 2021-08-16 LAB — CULTURE, OB URINE: Culture: <10000 — AB

## 2021-08-16 LAB — DIFFERENTIAL
Abs Immature Granulocytes: 0.29 10*3/uL — ABNORMAL HIGH (ref 0.00–0.07)
Basophils Absolute: 0.1 10*3/uL (ref 0.0–0.1)
Basophils Relative: 0 %
Eosinophils Absolute: 0 10*3/uL (ref 0.0–0.5)
Eosinophils Relative: 0 %
Immature Granulocytes: 1 %
Lymphocytes Relative: 6 %
Lymphs Abs: 1.3 10*3/uL (ref 0.7–4.0)
Monocytes Absolute: 0.5 10*3/uL (ref 0.1–1.0)
Monocytes Relative: 2 %
Neutro Abs: 18.4 10*3/uL — ABNORMAL HIGH (ref 1.7–7.7)
Neutrophils Relative %: 91 %

## 2021-08-16 LAB — CBC
HCT: 32.3 % — ABNORMAL LOW (ref 36.0–46.0)
Hemoglobin: 10.6 g/dL — ABNORMAL LOW (ref 12.0–15.0)
MCH: 32.3 pg (ref 26.0–34.0)
MCHC: 32.8 g/dL (ref 30.0–36.0)
MCV: 98.5 fL (ref 80.0–100.0)
Platelets: 272 10*3/uL (ref 150–400)
RBC: 3.28 MIL/uL — ABNORMAL LOW (ref 3.87–5.11)
RDW: 13.2 % (ref 11.5–15.5)
WBC: 20.6 10*3/uL — ABNORMAL HIGH (ref 4.0–10.5)
nRBC: 0 % (ref 0.0–0.2)

## 2021-08-16 LAB — TYPE AND SCREEN
ABO/RH(D): O POS
Antibody Screen: NEGATIVE

## 2021-08-16 MED ORDER — OXYCODONE HCL 5 MG PO TABS
5.0000 mg | ORAL_TABLET | ORAL | Status: DC | PRN
  Administered 2021-08-16: 5 mg via ORAL
  Filled 2021-08-16: qty 1

## 2021-08-16 MED ORDER — ZOLPIDEM TARTRATE 5 MG PO TABS: 5.0000 mg | ORAL_TABLET | Freq: Every evening | ORAL | Status: DC | PRN

## 2021-08-16 MED ORDER — PRENATAL MULTIVITAMIN CH
1.0000 | ORAL_TABLET | Freq: Every day | ORAL | Status: DC
  Administered 2021-08-16: 1 via ORAL
  Filled 2021-08-16: qty 1

## 2021-08-16 MED ORDER — DOCUSATE SODIUM 100 MG PO CAPS: 100.0000 mg | ORAL_CAPSULE | Freq: Every day | ORAL | Status: DC

## 2021-08-16 MED ORDER — ACETAMINOPHEN 325 MG PO TABS
650.0000 mg | ORAL_TABLET | ORAL | Status: DC | PRN
Start: 2021-08-16 — End: 2021-08-16

## 2021-08-16 MED ORDER — CALCIUM CARBONATE ANTACID 500 MG PO CHEW: 2.0000 | CHEWABLE_TABLET | ORAL | Status: DC | PRN

## 2021-08-16 MED ORDER — ACETAMINOPHEN 325 MG PO TABS
650.0000 mg | ORAL_TABLET | Freq: Four times a day (QID) | ORAL | Status: DC | PRN
Start: 2021-08-16 — End: 2021-08-17

## 2021-08-16 MED ORDER — SODIUM CHLORIDE 0.9 % IV SOLN
2.0000 g | INTRAVENOUS | Status: DC
  Administered 2021-08-16 – 2021-08-17 (×2): 2 g via INTRAVENOUS
  Filled 2021-08-16 (×2): qty 20

## 2021-08-16 MED ORDER — SODIUM CHLORIDE 0.9 % IV SOLN
25.0000 mg | Freq: Four times a day (QID) | INTRAVENOUS | Status: DC | PRN
  Administered 2021-08-16: 25 mg via INTRAVENOUS
  Filled 2021-08-16 (×2): qty 1

## 2021-08-16 MED ORDER — LACTATED RINGERS IV SOLN: INTRAVENOUS | Status: DC

## 2021-08-16 MED ORDER — PROMETHAZINE HCL 25 MG PO TABS: 25.0000 mg | ORAL_TABLET | Freq: Four times a day (QID) | ORAL | Status: DC | PRN

## 2021-08-16 NOTE — Progress Notes (Signed)
X1 Kidney stone was strained from urine. 3-4 mm in diameter.
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
## 2021-08-16 NOTE — Progress Notes (Signed)
Patient ID: Tina Gibbs, female   DOB: 2000-02-20, 21 y.o.   MRN: [id number] Pt feels better that she passed the stone BP (!) 112/59   Pulse 81   Temp 98.2 F (36.8 C) (Oral)   Resp 16   Ht 5' (1.524 m)   Wt 70.2 kg   LMP 02/08/2021 (Exact Date)   SpO2 99%   BMI 30.23 kg/m   FHTS  Abd soft NT gravid Gu no bleeding Ext neg homans sign Contiue IVF  Change meds to po Send stone to path for eval

## 2021-08-16 NOTE — MAU Note (Signed)
Pt given Fentanyl per order and pt request. Pt sleeping. Sat monitor remains reading 94-97% HR 98-100 R 20

## 2021-08-17 LAB — CBC WITH DIFFERENTIAL/PLATELET
Abs Immature Granulocytes: 0.06 10*3/uL (ref 0.00–0.07)
Basophils Absolute: 0 10*3/uL (ref 0.0–0.1)
Basophils Relative: 0 %
Eosinophils Absolute: 0.1 10*3/uL (ref 0.0–0.5)
Eosinophils Relative: 1 %
HCT: 25.1 % — ABNORMAL LOW (ref 36.0–46.0)
Hemoglobin: 8.6 g/dL — ABNORMAL LOW (ref 12.0–15.0)
Immature Granulocytes: 1 %
Lymphocytes Relative: 26 %
Lymphs Abs: 2.3 10*3/uL (ref 0.7–4.0)
MCH: 32.7 pg (ref 26.0–34.0)
MCHC: 34.3 g/dL (ref 30.0–36.0)
MCV: 95.4 fL (ref 80.0–100.0)
Monocytes Absolute: 0.5 10*3/uL (ref 0.1–1.0)
Monocytes Relative: 6 %
Neutro Abs: 6.1 10*3/uL (ref 1.7–7.7)
Neutrophils Relative %: 66 %
Platelets: 206 10*3/uL (ref 150–400)
RBC: 2.63 MIL/uL — ABNORMAL LOW (ref 3.87–5.11)
RDW: 13.3 % (ref 11.5–15.5)
WBC: 9.2 10*3/uL (ref 4.0–10.5)
nRBC: 0 % (ref 0.0–0.2)

## 2021-08-17 LAB — COMPREHENSIVE METABOLIC PANEL
ALT: 11 U/L (ref 0–44)
AST: 13 U/L — ABNORMAL LOW (ref 15–41)
Albumin: 2.3 g/dL — ABNORMAL LOW (ref 3.5–5.0)
Alkaline Phosphatase: 43 U/L (ref 38–126)
Anion gap: 6 (ref 5–15)
BUN: <5 mg/dL — ABNORMAL LOW (ref 6–20)
CO2: 24 mmol/L (ref 22–32)
Calcium: 8.3 mg/dL — ABNORMAL LOW (ref 8.9–10.3)
Chloride: 108 mmol/L (ref 98–111)
Creatinine, Ser: 0.51 mg/dL (ref 0.44–1.00)
GFR, Estimated: >60 mL/min (ref >60)
Glucose, Bld: 81 mg/dL (ref 70–99)
Potassium: 3.5 mmol/L (ref 3.5–5.1)
Sodium: 138 mmol/L (ref 135–145)
Total Bilirubin: 0.2 mg/dL — ABNORMAL LOW (ref 0.3–1.2)
Total Protein: 4.9 g/dL — ABNORMAL LOW (ref 6.5–8.1)

## 2021-08-17 MED ORDER — NITROFURANTOIN MONOHYD MACRO 100 MG PO CAPS: 100.0000 mg | ORAL_CAPSULE | Freq: Two times a day (BID) | ORAL | 3 refills | Status: DC

## 2021-08-17 NOTE — Discharge Summary (Signed)
ANTENATAL DISCHARGE SUMMARY  Patient ID: Tina Gibbs MRN: [id number] DOB/AGE: 24-Aug-2000 21 y.o.  Admit date: 08/15/2021 Discharge date: 08/17/2021  Admission Diagnoses: Renal calculus [N20.0] Nephrolithiasis [N20.0]  Discharge Diagnoses: Renal calculus [N20.0] Nephrolithiasis [N20.0]         Discharged Condition: good  Hospital Course: Patient admitted to antepartum service. IV fluids were given as well as IV antibiotic therapy for suspected pyelonephritis secondary to renal nephrolithiasis.  Urine cultures and UA performed.  Ultrasound of renal parenchyma demonstrated right hydronephrosis and 2m right renal calculus.  Patient passed the stone spontaneously and her pain resolved. She was discharged on HD#1/2.   Patient Active Problem List   Diagnosis Date Noted   Renal calculus 08/16/2021   Nephrolithiasis 08/16/2021   Right [place] quadrant abdominal abscess ([place] 06/06/2021   Ileus ([place] 06/06/2021   Acute perforated appendicitis 05/24/2021   Non-reassuring electronic fetal monitoring tracing 02/07/2019   Anemia in pregnancy 12/03/2018   UTI in pregnancy 08/22/2018   GBS (group B streptococcus) UTI complicating pregnancy 061/44/3154  Anxiety and depression 08/15/2018   Supervision of high risk pregnancy, antepartum 08/01/2018   Asthma    Influenza vaccination declined 03/06/2015   Unspecified asthma(493.90) 10/15/2012   Seasonal allergies 10/15/2012   ADHD (attention deficit hyperactivity disorder) 10/15/2012    Consults: None and MFM  Treatments: IV hydration, antibiotics: ceftriaxone, and renal ultrasound  Discharge Exam:  Vitals:   08/17/21 0338 08/17/21 0818  BP: (!) 106/57 118/60  Pulse: 80 87  Resp: 18 18  Temp: 98.2 F (36.8 C) 98.4 F (36.9 C)  SpO2:  99%    General: alert, cooperative, and no distress Abdomen: Gravid, soft, non tender Back: no CVA tenderness DVT Evaluation: No evidence of DVT seen on physical exam. No cords or calf  tenderness.  Labs:     Latest Ref Rng & Units 08/17/2021    8:00 AM 08/15/2021    9:25 PM  CBC  WBC 4.0 - 10.5 K/uL 9.2  20.5    20.6   Hemoglobin 12.0 - 15.0 g/dL 8.6  10.7    10.6   Hematocrit 36.0 - 46.0 % 25.1  31.7    32.3   Platelets 150 - 400 K/uL 206  273    272     Results for orders placed or performed during the hospital encounter of 08/15/21  Culture, OB Urine     Status: Abnormal   Collection Time: 08/15/21  8:52 PM   Specimen: Urine, Random  Result Value Ref Range Status   Specimen Description URINE, RANDOM  Final   Special Requests NONE  Final   Culture (A)  Final    <10,000 COLONIES/mL INSIGNIFICANT GROWTH NO GROUP B STREP (S.AGALACTIAE) ISOLATED Performed at [facility] [address], [place] [address]   Report Status 08/16/2021 FINAL  Final  Wet prep, genital     Status: None   Collection Time: 08/15/21  9:20 PM   Specimen: Vaginal  Result Value Ref Range Status   Yeast Wet Prep HPF POC NONE SEEN NONE SEEN Final   Trich, Wet Prep NONE SEEN NONE SEEN Final   Clue Cells Wet Prep HPF POC NONE SEEN NONE SEEN Final   WBC, Wet Prep HPF POC <10 <10 Final   Sperm NONE SEEN  Final    Comment: Performed at [facility] [address], [place] [address]   Urinalysis    Component Value Date/Time   COLORURINE  YELLOW 08/15/2021 2052   APPEARANCEUR CLOUDY (A) 08/15/2021 2052   LABSPEC 1.015 08/15/2021 2052   PHURINE 6.0 08/15/2021 2052   GLUCOSEU NEGATIVE 08/15/2021 2052   HGBUR MODERATE (A) 08/15/2021 2052   BILIRUBINUR NEGATIVE 08/15/2021 2052   BILIRUBINUR neg 05/19/2017 1040   KETONESUR 80 (A) 08/15/2021 2052   PROTEINUR 30 (A) 08/15/2021 2052   UROBILINOGEN 0.2 04/07/2021 1959   NITRITE NEGATIVE 08/15/2021 2052   LEUKOCYTESUR NEGATIVE 08/15/2021 2052     Disposition: home   Allergies as of 08/17/2021       Reactions   Latex Hives, Itching   Peanut-containing Drug Products Itching        Medication List      TAKE these medications    acetaminophen 325 MG tablet Commonly known as: TYLENOL Take 2 tablets (650 mg total) by mouth every 4 (four) hours as needed for mild pain (or temp > 100). What changed:  how much to take when to take this reasons to take this   famotidine 20 MG tablet Commonly known as: Pepcid Take 1 tablet (20 mg total) by mouth daily. What changed:  when to take this reasons to take this   metoCLOPramide 10 MG tablet Commonly known as: REGLAN Take 1 tablet (10 mg total) by mouth 3 (three) times daily with meals. What changed:  when to take this reasons to take this   nitrofurantoin (macrocrystal-monohydrate) 100 MG capsule Commonly known as: Macrobid Take 1 capsule (100 mg total) by mouth 2 (two) times daily.   Prenatabs Rx 29-1 MG Tabs Take 1 tablet by mouth daily.         Signed: Sanjuana Kava, MD 08/17/2021, 11:37 AM

## 2021-08-17 NOTE — Progress Notes (Signed)
Pt wants labs done after breakfast

## 2021-08-25 LAB — CALCULI, WITH PHOTOGRAPH (CLINICAL LAB)
Calcium Oxalate Monohydrate: 5 %
Hydroxyapatite: 95 %
Weight Calculi: 24 mg

## 2021-11-15 ENCOUNTER — Telehealth (HOSPITAL_COMMUNITY): Payer: Self-pay

## 2021-11-15 NOTE — Telephone Encounter (Signed)
Preadmission screening

## 2021-12-01 ENCOUNTER — Other Ambulatory Visit: Payer: Self-pay | Admitting: Obstetrics and Gynecology

## 2021-12-06 ENCOUNTER — Encounter (HOSPITAL_COMMUNITY): Payer: Self-pay | Admitting: Obstetrics & Gynecology

## 2021-12-06 ENCOUNTER — Inpatient Hospital Stay (HOSPITAL_COMMUNITY)
Admission: AD | Admit: 2021-12-06 | Discharge: 2021-12-09 | DRG: 807 | Disposition: A | Discharge status: Home / Self Care | Payer: Medicaid Other | Attending: Obstetrics and Gynecology | Admitting: Obstetrics and Gynecology

## 2021-12-06 DIAGNOSIS — Z87442 Personal history of urinary calculi: Secondary | ICD-10-CM

## 2021-12-06 DIAGNOSIS — O99324 Drug use complicating childbirth: Secondary | ICD-10-CM | POA: Diagnosis present

## 2021-12-06 DIAGNOSIS — D509 Iron deficiency anemia, unspecified: Secondary | ICD-10-CM | POA: Diagnosis present

## 2021-12-06 DIAGNOSIS — O1404 Mild to moderate pre-eclampsia, complicating childbirth: Principal | ICD-10-CM | POA: Diagnosis present

## 2021-12-06 DIAGNOSIS — O9902 Anemia complicating childbirth: Secondary | ICD-10-CM | POA: Diagnosis present

## 2021-12-06 DIAGNOSIS — Z3A40 40 weeks gestation of pregnancy: Secondary | ICD-10-CM

## 2021-12-06 DIAGNOSIS — F1290 Cannabis use, unspecified, uncomplicated: Secondary | ICD-10-CM | POA: Diagnosis present

## 2021-12-06 DIAGNOSIS — Z8673 Personal history of transient ischemic attack (TIA), and cerebral infarction without residual deficits: Secondary | ICD-10-CM

## 2021-12-06 MED ORDER — ACETAMINOPHEN-CAFFEINE 500-65 MG PO TABS
2.0000 | ORAL_TABLET | Freq: Once | ORAL | Status: AC
  Administered 2021-12-07: 2 via ORAL
  Filled 2021-12-06: qty 2

## 2021-12-06 NOTE — MAU Provider Note (Signed)
Event Date/Time   First Provider Initiated Contact with Patient 12/06/21 2314     S Ms. Tina Gibbs is a 21 y.o. G38P1011 pregnant female at 21w2dwho presents to MAU today with complaint of headache unrelieved by regular strength Tylenol, swollen ankles and feet with tingling in her feet, pain in her lower back, decreased fetal movement and strong contractions q 82m apart. Has a history of IOL for preeclampsia in her last pregnancy.    [facility]  Pertinent items noted in HPI and remainder of comprehensive ROS otherwise negative.   O BP (!) 141/89   Pulse 98   Temp 97.9 F (36.6 C) (Oral)   Resp 18   Ht '5\' 1"'$  (1.549 m)   Wt 186 lb 1.6 oz (84.4 kg)   LMP 02/08/2021 (Exact Date)   SpO2 100%   BMI 35.16 kg/m  Physical Exam Vitals and nursing note reviewed.  Constitutional:      Appearance: Normal appearance.  HENT:     Head: Normocephalic and atraumatic.  Cardiovascular:     Rate and Rhythm: Normal rate and regular rhythm.  Pulmonary:     Effort: Pulmonary effort is normal.  Musculoskeletal:        General: Normal range of motion.  Skin:    General: Skin is warm and dry.     Capillary Refill: Capillary refill takes less than 2 seconds.  Neurological:     Mental Status: She is alert and oriented to person, place, and time.  Psychiatric:        Mood and Affect: Mood normal.        Behavior: Behavior normal.        Thought Content: Thought content normal.        Judgment: Judgment normal.     Dilation: 3 Effacement (%): Thick Station: -3 Exam by:: AnOsvaldo HumanRN  (Was 1-2cm in the office last Tuesday)   Fetal Tracing: reactive Baseline: 160 Variability: moderate Accelerations: 15x15 Decelerations: occasional variable Toco: q2-45m545m MDM/MAU Course: First two BP elevated on admission to MAU, fetal heart rate reactive but mildly tachycardic in the 150s-170s. Given presentation, report called to Dr. KulAlesia Richardso agreed to admit  pt for IOL due to post-dates with DFM. PEC workup ordered. IOL orders in by Dr. RobMancel Bale 12/01/21 but RN unable to see/release so admission orders placed.  A Indication for care in labor and delivery Medical screening exam complete  P Admit to L&D for expectant management/possible augmentation Care turned over to Dr. KulAllena NapoleonamElmwood ParkNM 12/06/2021 11:32 PM

## 2021-12-06 NOTE — MAU Note (Signed)
Pt says UC's strong since 8pm [facility] VE 1-2 cm - last Tuesday Sch for an Induction on 12-11-2021 Ankles swollen- feet tingle Has had H/A x2 days - took reg Tyl yesterday  Pain in lower back

## 2021-12-07 ENCOUNTER — Other Ambulatory Visit: Payer: Self-pay

## 2021-12-07 ENCOUNTER — Encounter (HOSPITAL_COMMUNITY): Payer: Self-pay | Admitting: Obstetrics & Gynecology

## 2021-12-07 ENCOUNTER — Inpatient Hospital Stay (HOSPITAL_COMMUNITY): Payer: Medicaid Other | Admitting: Anesthesiology

## 2021-12-07 DIAGNOSIS — O99324 Drug use complicating childbirth: Secondary | ICD-10-CM | POA: Diagnosis present

## 2021-12-07 DIAGNOSIS — Z3A40 40 weeks gestation of pregnancy: Secondary | ICD-10-CM | POA: Diagnosis not present

## 2021-12-07 DIAGNOSIS — Z8673 Personal history of transient ischemic attack (TIA), and cerebral infarction without residual deficits: Secondary | ICD-10-CM | POA: Diagnosis not present

## 2021-12-07 DIAGNOSIS — F1290 Cannabis use, unspecified, uncomplicated: Secondary | ICD-10-CM | POA: Diagnosis present

## 2021-12-07 DIAGNOSIS — D509 Iron deficiency anemia, unspecified: Secondary | ICD-10-CM | POA: Diagnosis present

## 2021-12-07 DIAGNOSIS — Z87442 Personal history of urinary calculi: Secondary | ICD-10-CM | POA: Diagnosis not present

## 2021-12-07 DIAGNOSIS — O1404 Mild to moderate pre-eclampsia, complicating childbirth: Secondary | ICD-10-CM | POA: Diagnosis present

## 2021-12-07 DIAGNOSIS — O9902 Anemia complicating childbirth: Secondary | ICD-10-CM | POA: Diagnosis present

## 2021-12-07 DIAGNOSIS — O26893 Other specified pregnancy related conditions, third trimester: Secondary | ICD-10-CM | POA: Diagnosis present

## 2021-12-07 LAB — CBC
HCT: 26.1 % — ABNORMAL LOW (ref 36.0–46.0)
HCT: 28.7 % — ABNORMAL LOW (ref 36.0–46.0)
HCT: 28.9 % — ABNORMAL LOW (ref 36.0–46.0)
Hemoglobin: 10.1 g/dL — ABNORMAL LOW (ref 12.0–15.0)
Hemoglobin: 9.1 g/dL — ABNORMAL LOW (ref 12.0–15.0)
Hemoglobin: 9.7 g/dL — ABNORMAL LOW (ref 12.0–15.0)
MCH: 32 pg (ref 26.0–34.0)
MCH: 32.2 pg (ref 26.0–34.0)
MCH: 32.4 pg (ref 26.0–34.0)
MCHC: 33.6 g/dL (ref 30.0–36.0)
MCHC: 34.9 g/dL (ref 30.0–36.0)
MCHC: 35.2 g/dL (ref 30.0–36.0)
MCV: 92 fL (ref 80.0–100.0)
MCV: 92.2 fL (ref 80.0–100.0)
MCV: 95.4 fL (ref 80.0–100.0)
Platelets: 194 10*3/uL (ref 150–400)
Platelets: 211 10*3/uL (ref 150–400)
Platelets: 229 10*3/uL (ref 150–400)
RBC: 2.83 MIL/uL — ABNORMAL LOW (ref 3.87–5.11)
RBC: 3.03 MIL/uL — ABNORMAL LOW (ref 3.87–5.11)
RBC: 3.12 MIL/uL — ABNORMAL LOW (ref 3.87–5.11)
RDW: 13.4 % (ref 11.5–15.5)
RDW: 13.5 % (ref 11.5–15.5)
RDW: 13.7 % (ref 11.5–15.5)
WBC: 11.3 10*3/uL — ABNORMAL HIGH (ref 4.0–10.5)
WBC: 12.9 10*3/uL — ABNORMAL HIGH (ref 4.0–10.5)
WBC: 16.9 10*3/uL — ABNORMAL HIGH (ref 4.0–10.5)
nRBC: 0 % (ref 0.0–0.2)
nRBC: 0 % (ref 0.0–0.2)
nRBC: 0.2 % (ref 0.0–0.2)

## 2021-12-07 LAB — PROTEIN / CREATININE RATIO, URINE
Creatinine, Urine: 136 mg/dL
Creatinine, Urine: 21 mg/dL
Protein Creatinine Ratio: 0.2 mg/mg{Cre} — ABNORMAL HIGH (ref 0.00–0.15)
Protein Creatinine Ratio: 1.62 mg/mg{Cre} — ABNORMAL HIGH (ref 0.00–0.15)
Total Protein, Urine: 27 mg/dL
Total Protein, Urine: 34 mg/dL

## 2021-12-07 LAB — BASIC METABOLIC PANEL
Anion gap: 7 (ref 5–15)
BUN: 5 mg/dL — ABNORMAL LOW (ref 6–20)
CO2: 19 mmol/L — ABNORMAL LOW (ref 22–32)
Calcium: 8.3 mg/dL — ABNORMAL LOW (ref 8.9–10.3)
Chloride: 109 mmol/L (ref 98–111)
Creatinine, Ser: 0.57 mg/dL (ref 0.44–1.00)
GFR, Estimated: >60 mL/min (ref >60)
Glucose, Bld: 86 mg/dL (ref 70–99)
Potassium: 3.5 mmol/L (ref 3.5–5.1)
Sodium: 135 mmol/L (ref 135–145)

## 2021-12-07 LAB — COMPREHENSIVE METABOLIC PANEL
ALT: 10 U/L (ref 0–44)
AST: 15 U/L (ref 15–41)
Albumin: 2.8 g/dL — ABNORMAL LOW (ref 3.5–5.0)
Alkaline Phosphatase: 137 U/L — ABNORMAL HIGH (ref 38–126)
Anion gap: 10 (ref 5–15)
BUN: <5 mg/dL — ABNORMAL LOW (ref 6–20)
CO2: 19 mmol/L — ABNORMAL LOW (ref 22–32)
Calcium: 8.7 mg/dL — ABNORMAL LOW (ref 8.9–10.3)
Chloride: 108 mmol/L (ref 98–111)
Creatinine, Ser: 0.61 mg/dL (ref 0.44–1.00)
GFR, Estimated: >60 mL/min (ref >60)
Glucose, Bld: 92 mg/dL (ref 70–99)
Potassium: 3.2 mmol/L — ABNORMAL LOW (ref 3.5–5.1)
Sodium: 137 mmol/L (ref 135–145)
Total Bilirubin: <0.1 mg/dL — ABNORMAL LOW (ref 0.3–1.2)
Total Protein: 5.7 g/dL — ABNORMAL LOW (ref 6.5–8.1)

## 2021-12-07 LAB — TYPE AND SCREEN
ABO/RH(D): O POS
Antibody Screen: NEGATIVE

## 2021-12-07 LAB — RPR: RPR Ser Ql: NONREACTIVE

## 2021-12-07 MED ORDER — OXYTOCIN-SODIUM CHLORIDE 30-0.9 UT/500ML-% IV SOLN
2.5000 [IU]/h | INTRAVENOUS | Status: DC
  Administered 2021-12-07: 2.5 [IU]/h via INTRAVENOUS

## 2021-12-07 MED ORDER — WITCH HAZEL-GLYCERIN EX PADS: 1.0000 | MEDICATED_PAD | CUTANEOUS | Status: DC | PRN

## 2021-12-07 MED ORDER — TETANUS-DIPHTH-ACELL PERTUSSIS 5-2.5-18.5 LF-MCG/0.5 IM SUSY: 0.5000 mL | PREFILLED_SYRINGE | Freq: Once | INTRAMUSCULAR | Status: DC

## 2021-12-07 MED ORDER — TERBUTALINE SULFATE 1 MG/ML IJ SOLN: 0.2500 mg | Freq: Once | INTRAMUSCULAR | Status: DC | PRN

## 2021-12-07 MED ORDER — OXYCODONE-ACETAMINOPHEN 5-325 MG PO TABS: 2.0000 | ORAL_TABLET | ORAL | Status: DC | PRN

## 2021-12-07 MED ORDER — MISOPROSTOL 200 MCG PO TABS
ORAL_TABLET | ORAL | Status: AC
  Administered 2021-12-07: 800 ug via VAGINAL
  Filled 2021-12-07: qty 4

## 2021-12-07 MED ORDER — IBUPROFEN 600 MG PO TABS
600.0000 mg | ORAL_TABLET | Freq: Four times a day (QID) | ORAL | Status: DC
  Administered 2021-12-07 – 2021-12-09 (×7): 600 mg via ORAL
  Filled 2021-12-07 (×8): qty 1

## 2021-12-07 MED ORDER — DIBUCAINE (PERIANAL) 1 % EX OINT: 1.0000 | TOPICAL_OINTMENT | CUTANEOUS | Status: DC | PRN

## 2021-12-07 MED ORDER — OXYTOCIN-SODIUM CHLORIDE 30-0.9 UT/500ML-% IV SOLN: 2.5000 [IU]/h | INTRAVENOUS | Status: DC | PRN

## 2021-12-07 MED ORDER — LACTATED RINGERS IV SOLN: 500.0000 mL | Freq: Once | INTRAVENOUS | Status: DC

## 2021-12-07 MED ORDER — EPHEDRINE 5 MG/ML INJ: 10.0000 mg | INTRAVENOUS | Status: DC | PRN

## 2021-12-07 MED ORDER — ACETAMINOPHEN 500 MG PO TABS
1000.0000 mg | ORAL_TABLET | Freq: Once | ORAL | Status: AC
  Administered 2021-12-07: 1000 mg via ORAL
  Filled 2021-12-07: qty 2

## 2021-12-07 MED ORDER — OXYCODONE HCL 5 MG PO TABS: 5.0000 mg | ORAL_TABLET | ORAL | Status: DC | PRN

## 2021-12-07 MED ORDER — MISOPROSTOL 200 MCG PO TABS: 800.0000 ug | ORAL_TABLET | Freq: Once | ORAL | Status: AC

## 2021-12-07 MED ORDER — OXYTOCIN-SODIUM CHLORIDE 30-0.9 UT/500ML-% IV SOLN
1.0000 m[IU]/min | INTRAVENOUS | Status: DC
  Administered 2021-12-07: 2 m[IU]/min via INTRAVENOUS
  Filled 2021-12-07: qty 500

## 2021-12-07 MED ORDER — SENNOSIDES-DOCUSATE SODIUM 8.6-50 MG PO TABS
2.0000 | ORAL_TABLET | Freq: Every day | ORAL | Status: DC
  Administered 2021-12-08 – 2021-12-09 (×2): 2 via ORAL
  Filled 2021-12-07 (×2): qty 2

## 2021-12-07 MED ORDER — OXYCODONE-ACETAMINOPHEN 5-325 MG PO TABS: 1.0000 | ORAL_TABLET | ORAL | Status: DC | PRN

## 2021-12-07 MED ORDER — OXYCODONE HCL 5 MG PO TABS: 10.0000 mg | ORAL_TABLET | ORAL | Status: DC | PRN

## 2021-12-07 MED ORDER — LACTATED RINGERS IV SOLN: 500.0000 mL | INTRAVENOUS | Status: DC | PRN

## 2021-12-07 MED ORDER — ZOLPIDEM TARTRATE 5 MG PO TABS: 5.0000 mg | ORAL_TABLET | Freq: Every evening | ORAL | Status: DC | PRN

## 2021-12-07 MED ORDER — DIPHENHYDRAMINE HCL 50 MG/ML IJ SOLN: 12.5000 mg | INTRAMUSCULAR | Status: DC | PRN

## 2021-12-07 MED ORDER — OXYTOCIN BOLUS FROM INFUSION
333.0000 mL | Freq: Once | INTRAVENOUS | Status: AC
  Administered 2021-12-07: 333 mL via INTRAVENOUS

## 2021-12-07 MED ORDER — PRENATAL MULTIVITAMIN CH
1.0000 | ORAL_TABLET | Freq: Every day | ORAL | Status: DC
  Administered 2021-12-08 – 2021-12-09 (×2): 1 via ORAL
  Filled 2021-12-07 (×2): qty 1

## 2021-12-07 MED ORDER — ACETAMINOPHEN 325 MG PO TABS: 650.0000 mg | ORAL_TABLET | ORAL | Status: DC | PRN

## 2021-12-07 MED ORDER — ONDANSETRON HCL 4 MG PO TABS: 4.0000 mg | ORAL_TABLET | ORAL | Status: DC | PRN

## 2021-12-07 MED ORDER — ACETAMINOPHEN 325 MG PO TABS
650.0000 mg | ORAL_TABLET | ORAL | Status: DC | PRN
  Administered 2021-12-07: 650 mg via ORAL
  Filled 2021-12-07: qty 2

## 2021-12-07 MED ORDER — POTASSIUM CHLORIDE CRYS ER 20 MEQ PO TBCR
40.0000 meq | EXTENDED_RELEASE_TABLET | ORAL | Status: AC
  Administered 2021-12-07 (×2): 40 meq via ORAL
  Filled 2021-12-07 (×2): qty 2

## 2021-12-07 MED ORDER — ONDANSETRON HCL 4 MG/2ML IJ SOLN: 4.0000 mg | INTRAMUSCULAR | Status: DC | PRN

## 2021-12-07 MED ORDER — PHENYLEPHRINE 80 MCG/ML (10ML) SYRINGE FOR IV PUSH (FOR BLOOD PRESSURE SUPPORT): 80.0000 ug | PREFILLED_SYRINGE | INTRAVENOUS | Status: DC | PRN

## 2021-12-07 MED ORDER — ONDANSETRON HCL 4 MG/2ML IJ SOLN
4.0000 mg | Freq: Four times a day (QID) | INTRAMUSCULAR | Status: DC | PRN
  Administered 2021-12-07: 4 mg via INTRAVENOUS
  Filled 2021-12-07: qty 2

## 2021-12-07 MED ORDER — LIDOCAINE HCL (PF) 1 % IJ SOLN: 30.0000 mL | INTRAMUSCULAR | Status: DC | PRN

## 2021-12-07 MED ORDER — SIMETHICONE 80 MG PO CHEW: 80.0000 mg | CHEWABLE_TABLET | ORAL | Status: DC | PRN

## 2021-12-07 MED ORDER — DIPHENHYDRAMINE HCL 25 MG PO CAPS: 25.0000 mg | ORAL_CAPSULE | Freq: Four times a day (QID) | ORAL | Status: DC | PRN

## 2021-12-07 MED ORDER — LACTATED RINGERS IV SOLN: INTRAVENOUS | Status: DC

## 2021-12-07 MED ORDER — FENTANYL-BUPIVACAINE-NACL 0.5-0.125-0.9 MG/250ML-% EP SOLN
12.0000 mL/h | EPIDURAL | Status: DC | PRN
  Administered 2021-12-07: 12 mL/h via EPIDURAL
  Filled 2021-12-07: qty 250

## 2021-12-07 MED ORDER — PHENYLEPHRINE 80 MCG/ML (10ML) SYRINGE FOR IV PUSH (FOR BLOOD PRESSURE SUPPORT)
80.0000 ug | PREFILLED_SYRINGE | INTRAVENOUS | Status: DC | PRN
  Filled 2021-12-07: qty 10

## 2021-12-07 MED ORDER — SOD CITRATE-CITRIC ACID 500-334 MG/5ML PO SOLN: 30.0000 mL | ORAL | Status: DC | PRN

## 2021-12-07 MED ORDER — LIDOCAINE HCL (PF) 1 % IJ SOLN
INTRAMUSCULAR | Status: DC | PRN
  Administered 2021-12-07: 5 mL via EPIDURAL
  Administered 2021-12-07: 2 mL via EPIDURAL
  Administered 2021-12-07: 3 mL via EPIDURAL

## 2021-12-07 MED ORDER — COCONUT OIL OIL: 1.0000 | TOPICAL_OIL | Status: DC | PRN

## 2021-12-07 MED ORDER — BENZOCAINE-MENTHOL 20-0.5 % EX AERO: 1.0000 | INHALATION_SPRAY | CUTANEOUS | Status: DC | PRN

## 2021-12-07 NOTE — Anesthesia Procedure Notes (Addendum)
Epidural Patient location during procedure: OB Start time: 12/07/2021 7:57 AM End time: 12/07/2021 8:16 AM  Staffing Anesthesiologist: Lidia Collum, MD Performed: anesthesiologist   Preanesthetic Checklist Completed: patient identified, IV checked, risks and benefits discussed, monitors and equipment checked, pre-op evaluation and timeout performed  Epidural Patient position: sitting Prep: DuraPrep Patient monitoring: heart rate, continuous pulse ox and blood pressure Approach: midline Location: L3-L4 Injection technique: LOR air  Needle:  Needle type: Tuohy  Needle gauge: 17 G Needle length: 9 cm Needle insertion depth: 5 cm Catheter type: closed end flexible Catheter size: 19 Gauge Catheter at skin depth: 10 cm Test dose: negative  Assessment Events: blood not aspirated, injection not painful, no injection resistance, no paresthesia and negative IV test  Additional Notes 3 attempts. First attempt no loss, patient complaining of pain on left side. Insertion point adjusted slightly above and to the right. Loss at 6 cm. Catheter threaded easily. Negative aspiration, but extremely high resistance to injection. Catheter removed, third attempt same insertion point with new angle and new needle/catheter. Loss at 5 cm. Catheter threaded easily. Negative aspiration, no resistance to injection.Reason for block:procedure for pain

## 2021-12-07 NOTE — Progress Notes (Signed)
Talked to Dr Everett Graff re: BP and labs that she requested from earlier conversation.  Patient BP has remained borderline elevated (with one measurement elevated when patient was agitated).  The urine creatine:protein ratio was elevated but was a clean catch (contaminated with blood).  Repeat urine test with urine via straight catheter.  If the catherized sample results >0.3 or higher, we are to notify dr.  Also if BP >150/100  overnight notify dr (or if patient develops headache, blurred vision, etc).

## 2021-12-07 NOTE — Anesthesia Preprocedure Evaluation (Signed)
Anesthesia Evaluation  Patient identified by MRN, date of birth, ID band Patient awake    Reviewed: Allergy & Precautions, H&P , NPO status , Patient's Chart, lab work & pertinent test results  History of Anesthesia Complications Negative for: history of anesthetic complications  Airway Mallampati: II  TM Distance: >3 FB     Dental   Pulmonary asthma    Pulmonary exam normal        Cardiovascular negative cardio ROS  Rhythm:regular Rate:Normal     Neuro/Psych   Anxiety Depression    negative neurological ROS  negative psych ROS   GI/Hepatic negative GI ROS, Neg liver ROS,,,  Endo/Other  negative endocrine ROS    Renal/GU negative Renal ROS  negative genitourinary   Musculoskeletal negative musculoskeletal ROS (+)    Abdominal   Peds  Hematology  (+) Blood dyscrasia, anemia   Anesthesia Other Findings   Reproductive/Obstetrics (+) Pregnancy                             Anesthesia Physical Anesthesia Plan  ASA: 2  Anesthesia Plan: Epidural   Post-op Pain Management:    Induction:   PONV Risk Score and Plan:   Airway Management Planned:   Additional Equipment:   Intra-op Plan:   Post-operative Plan:   Informed Consent: I have reviewed the patients History and Physical, chart, labs and discussed the procedure including the risks, benefits and alternatives for the proposed anesthesia with the patient or authorized representative who has indicated his/her understanding and acceptance.       Plan Discussed with:   Anesthesia Plan Comments:        Anesthesia Quick Evaluation
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
## 2021-12-07 NOTE — Progress Notes (Addendum)
Informed by RN that IUPC fell out Replaced now without difficulty 8/80/-1 Cat 1 tracing

## 2021-12-07 NOTE — Progress Notes (Signed)
Tina Gibbs is a 21 y.o. G3P1011 at 14w3dby admitted for early labor and mildly elevated BPs.  Subjective: Denies F/C/N/V/D  Objective: BP (!) 117/59   Pulse 64   Temp 97.8 F (36.6 C)   Resp 16   Ht '5\' 1"'$  (1.549 m)   Wt 84.4 kg   LMP 02/08/2021 (Exact Date)   SpO2 100%   BMI 35.16 kg/m  No intake/output data recorded. No intake/output data recorded.  FHT:  FHR: 130 bpm, variability: moderate,  accelerations:  Present,  decelerations:  Absent UC:   regular, every 3 minutes SVE:   Dilation: 6 Effacement (%): 80 Station: -1 to -2 Exam by:: AEverett Graff MD AROM clear fluid IUPC placed  Labs: Lab Results  Component Value Date   WBC 11.3 (H) 12/07/2021   HGB 10.1 (L) 12/07/2021   HCT 28.7 (L) 12/07/2021   MCV 92.0 12/07/2021   PLT 211 12/07/2021    Assessment / Plan: Augmentation of labor, progressing well  Labor: Progressing normally Preeclampsia:  no signs or symptoms of toxicity Fetal Wellbeing:  Category I Pain Control:  Epidural I/D:   GBS negative Anticipated MOD:  NSVD  ADelice Lesch MD 12/07/2021, 12:44 PM

## 2021-12-07 NOTE — Lactation Note (Signed)
This note was copied from a baby's chart. Lactation Consultation Note  Patient Name: Tina Gibbs [id number] Date: 12/07/2021 Reason for consult: Initial assessment;Term Age:23 hours Mom had asked RN for pump so she can pump BM and FOB feed the baby as well as mom BF baby. Mom stated she had been leaking for past month. Praised mom for all the BM she pumped. Milk storage reviewed. Suggested when FOB wants to feed the baby mom pumps and uses that BM and the next time mom BF that way baby is BF and is going to the breast. Supply and demand reviewed. Newborn feeding habits, STS, I&O, reviewed. Mom encouraged to feed baby 8-12 times/24 hours and with feeding cues.  Encouraged mom to call for assistance or questions.  Maternal Data Has patient been taught Hand Expression?: Yes Does the patient have breastfeeding experience prior to this delivery?: Yes How long did the patient breastfeed?: 4 months  Feeding    LATCH Score       Type of Nipple: Everted at rest and after stimulation            Lactation Tools Discussed/Used Tools: Pump Breast pump type: Double-Electric Breast Pump Pump Education: Setup, frequency, and cleaning;Milk Storage Reason for Pumping: mom requested so FOB can feed the baby Pumping frequency: q3hr Pumped volume: 37 mL  Interventions Interventions: DEBP;Expressed milk;Hand express;LC Services brochure;Breast feeding basics reviewed  Discharge    Consult Status Consult Status: Follow-up Date: 12/08/21 Follow-up type: In-patient    Theodoro Kalata 12/07/2021, 9:19 PM

## 2021-12-08 LAB — COMPREHENSIVE METABOLIC PANEL
ALT: 11 U/L (ref 0–44)
AST: 19 U/L (ref 15–41)
Albumin: 2.3 g/dL — ABNORMAL LOW (ref 3.5–5.0)
Alkaline Phosphatase: 121 U/L (ref 38–126)
Anion gap: 8 (ref 5–15)
BUN: <5 mg/dL — ABNORMAL LOW (ref 6–20)
CO2: 20 mmol/L — ABNORMAL LOW (ref 22–32)
Calcium: 8.5 mg/dL — ABNORMAL LOW (ref 8.9–10.3)
Chloride: 108 mmol/L (ref 98–111)
Creatinine, Ser: 0.59 mg/dL (ref 0.44–1.00)
GFR, Estimated: >60 mL/min (ref >60)
Glucose, Bld: 75 mg/dL (ref 70–99)
Potassium: 3.8 mmol/L (ref 3.5–5.1)
Sodium: 136 mmol/L (ref 135–145)
Total Bilirubin: 0.2 mg/dL — ABNORMAL LOW (ref 0.3–1.2)
Total Protein: 4.9 g/dL — ABNORMAL LOW (ref 6.5–8.1)

## 2021-12-08 LAB — HEPATIC FUNCTION PANEL
ALT: 10 U/L (ref 0–44)
AST: 19 U/L (ref 15–41)
Albumin: 2.4 g/dL — ABNORMAL LOW (ref 3.5–5.0)
Alkaline Phosphatase: 129 U/L — ABNORMAL HIGH (ref 38–126)
Bilirubin, Direct: <0.1 mg/dL (ref 0.0–0.2)
Total Bilirubin: 0.4 mg/dL (ref 0.3–1.2)
Total Protein: 5.1 g/dL — ABNORMAL LOW (ref 6.5–8.1)

## 2021-12-08 LAB — CBC
HCT: 26.4 % — ABNORMAL LOW (ref 36.0–46.0)
Hemoglobin: 9.3 g/dL — ABNORMAL LOW (ref 12.0–15.0)
MCH: 32.9 pg (ref 26.0–34.0)
MCHC: 35.2 g/dL (ref 30.0–36.0)
MCV: 93.3 fL (ref 80.0–100.0)
Platelets: 184 10*3/uL (ref 150–400)
RBC: 2.83 MIL/uL — ABNORMAL LOW (ref 3.87–5.11)
RDW: 13.4 % (ref 11.5–15.5)
WBC: 12.8 10*3/uL — ABNORMAL HIGH (ref 4.0–10.5)
nRBC: 0 % (ref 0.0–0.2)

## 2021-12-08 LAB — PROTEIN / CREATININE RATIO, URINE
Creatinine, Urine: 22 mg/dL
Protein Creatinine Ratio: 0.5 mg/mg{Cre} — ABNORMAL HIGH (ref 0.00–0.15)
Total Protein, Urine: 11 mg/dL

## 2021-12-08 MED ORDER — NIFEDIPINE ER OSMOTIC RELEASE 30 MG PO TB24
30.0000 mg | ORAL_TABLET | Freq: Every day | ORAL | Status: DC
  Administered 2021-12-08 – 2021-12-09 (×2): 30 mg via ORAL
  Filled 2021-12-08 (×2): qty 1

## 2021-12-08 NOTE — Progress Notes (Signed)
Post Partum Day 1 Subjective: no complaints, up ad lib, voiding, tolerating PO, and no headache or CP  Objective: Blood pressure 123/78, pulse 80, temperature 98 F (36.7 C), temperature source Oral, resp. rate 18, height '5\' 1"'$  (1.549 m), weight 84.4 kg, last menstrual period 02/08/2021, SpO2 100 %, unknown if currently breastfeeding.  Physical Exam:  General: alert and cooperative Lochia: appropriate Uterine Fundus: firm Incision: na DVT Evaluation: No evidence of DVT seen on physical exam.  Recent Labs    12/07/21 2303 12/08/21 0606  HGB 9.1* 9.3*  HCT 26.1* 26.4*    Assessment/Plan: Preeclampsia without severe features Start procarida Monitor closely SW consult Anticipate DC in am   LOS: 1 day   Betsy Coder, MD 12/08/2021, 7:20 PM

## 2021-12-08 NOTE — Lactation Note (Signed)
This note was copied from a baby's chart. Lactation Consultation Note  Patient Name: Tina Gibbs [id number] Date: 12/08/2021 Reason for consult: Follow-up assessment;Term Age:23 hours   P2: Term infant at 40+3 weeks Feeding preference: Breast/formula Weight loss: 2%  Arrived to visit with family; mother has not been successful with waking and feeding baby all day.  She stated that he has not fed since approximately 0900 today even though she has attempted.  Offered to assist with waking and latching; mother receptive.  Baby was easy to awaken.  Discussed feeding on cue or attempt approximately every three hours.  Baby had not voided when I entered at 23 hours of life.  Mother able to hand express colostrum.  Assisted to latch in the football hold to the left breast.  With constant stimulation he was able to feed for 13 minutes.  Reviewed body alignment, positioning, breast compressions and basic breast feeding concepts.  Mother has not been pumping consistently, however, she has 20 mls of expressed milk in the refrigerator.  Encouraged to always feed back her expressed milk after breast feeding.  Allowed mother the opportunity to bottle feed using the purple extra slow flow nipple.  Baby has been very spitty, gagging and passing gas.  She was not able to successfully feed him.  I also tried showing paced bottle feeding and he was not able to consume more than 5 mls with this nipple.  His suck is uncoordinated and he thrusts the nipple out during feeding. He required cheek/jaw support to maintain a grasp on the nipple which was very poor.  Obtained the white Nfant nipple and he was able to consume 7 additional mls.  The total feeding time was 25 minutes for 12 mls.  NP notified.  Emphasized to the parents to contact their RN if they have feeding difficulty any time tonight.  Current RN updated. SLP not available due to the Thanksgiving holiday tomorrow.  Baby requires monitoring with his oral  feeding skills.    Encouraged mother to pump every three hours; discussed milk storage times.  Father present.   Maternal Data Has patient been taught Hand Expression?: Yes Does the patient have breastfeeding experience prior to this delivery?: Yes How long did the patient breastfeed?: 4 months  Feeding Mother's Current Feeding Choice: Breast Milk and Formula Nipple Type: Nfant Standard Flow (white)  LATCH Score Latch: Repeated attempts needed to sustain latch, nipple held in mouth throughout feeding, stimulation needed to elicit sucking reflex.  Audible Swallowing: A few with stimulation  Type of Nipple: Everted at rest and after stimulation  Comfort (Breast/Nipple): Soft / non-tender  Hold (Positioning): Assistance needed to correctly position infant at breast and maintain latch.  LATCH Score: 7   Lactation Tools Discussed/Used Tools: Pump;Flanges Flange Size: 24 Breast pump type: Double-Electric Breast Pump;Manual Pump Education: Setup, frequency, and cleaning;Milk Storage (Reviewed) Reason for Pumping: Mother's request to pump Pumping frequency: Every three hours Pumped volume:  (20 mls with last pumping session)  Interventions Interventions: Breast feeding basics reviewed;Assisted with latch;Skin to skin;Breast massage;Hand express;Breast compression;Hand pump;Expressed milk;Position options;Support pillows;Adjust position;DEBP;Education;Pace feeding  Discharge Pump: Personal  Consult Status Consult Status: Follow-up Date: 12/09/21 Follow-up type: In-patient    [name] 12/08/2021, 4:19 PM

## 2021-12-08 NOTE — Progress Notes (Signed)
Both urine PCRs are clean catch. First one pink tinged so requested straight cath UPCR.  Per RN, pt refused and another cc UPCR was sent.  Both elevated.  Discussed at sign out with oncoming MD.

## 2021-12-08 NOTE — Progress Notes (Signed)
Talked to Dr. Everett Graff via secure chat.  Notified that urine protein:creatine ration is >0.3.  current BP 135/88.  Per dr will continue to monitor with no new orders.

## 2021-12-08 NOTE — Anesthesia Postprocedure Evaluation (Signed)
Anesthesia Post Note  Patient: Tina Gibbs  Procedure(s) Performed: AN AD [place]     Patient location during evaluation: Mother Baby Anesthesia Type: Epidural Level of consciousness: awake Pain management: satisfactory to patient Vital Signs Assessment: post-procedure vital signs reviewed and stable Respiratory status: spontaneous breathing Cardiovascular status: stable Anesthetic complications: no   No notable events documented.  Last Vitals:  Vitals:   12/08/21 0547 12/08/21 0648  BP: (!) 135/105 (!) 139/90  Pulse: (!) 43 66  Resp: 18   Temp: 37 C   SpO2: 100%     Last Pain:  Vitals:   12/08/21 0648  TempSrc:   PainSc: 0-No pain   Pain Goal:                   Casimer Lanius

## 2021-12-09 MED ORDER — IBUPROFEN 600 MG PO TABS: 600.0000 mg | ORAL_TABLET | Freq: Four times a day (QID) | ORAL | 0 refills | Status: DC

## 2021-12-09 MED ORDER — FERROUS SULFATE 325 (65 FE) MG PO TABS: 325.0000 mg | ORAL_TABLET | Freq: Every day | ORAL | 11 refills | Status: AC

## 2021-12-09 MED ORDER — DOCUSATE SODIUM 100 MG PO CAPS: 100.0000 mg | ORAL_CAPSULE | Freq: Two times a day (BID) | ORAL | 0 refills | Status: AC

## 2021-12-09 MED ORDER — NIFEDIPINE ER 30 MG PO TB24: 30.0000 mg | ORAL_TABLET | Freq: Every day | ORAL | 11 refills | Status: AC

## 2021-12-09 NOTE — Discharge Summary (Signed)
Obstetric Discharge Summary Reason for Admission: onset of labor Prenatal Procedures: none Intrapartum Procedures: spontaneous vaginal delivery Postpartum Procedures: none Complications-Operative and Postpartum: none Hemoglobin  Date Value Ref Range Status  12/08/2021 9.3 (L) 12.0 - 15.0 g/dL Final  11/29/2018 10.3 (L) 11.1 - 15.9 g/dL Final   HCT  Date Value Ref Range Status  12/08/2021 26.4 (L) 36.0 - 46.0 % Final   Hematocrit  Date Value Ref Range Status  11/29/2018 30.4 (L) 34.0 - 46.6 % Final    Physical Exam:  General: alert and no distress Lochia: appropriate Uterine Fundus: firm Incision: n/a DVT Evaluation: No evidence of DVT seen on physical exam.    Discharge Diagnoses: Preelampsia  Discharge Information: Date: 12/09/2021 Activity: pelvic rest Diet: routine Medications: Ibuprofen, Colace, Iron, and procardia Condition: stable Instructions: refer to practice specific booklet Discharge to: home  Follow-up Tina Gibbs. Schedule an appointment as soon as possible for a visit in 1 week(s).   Why: BP check Contact information: [address] [place] [address] [phone]                Newborn Data: Live born female  Birth Weight: 7 lb 4.4 oz (3300 g) APGAR: 7, 9  Newborn Delivery   Time head delivered: 12/07/2021 15:55:00 Birth date/time: 12/07/2021 15:55:00 Delivery type: Vaginal, Spontaneous      Home with mother.  Tina Gibbs Class 12/09/2021, 11:33 AM

## 2021-12-09 NOTE — Progress Notes (Addendum)
Post Partum Day 2  Subjective: no complaints, up ad lib, voiding, tolerating PO, and + flatus  Objective: Blood pressure 117/78, pulse 65, temperature 98.1 F (36.7 C), temperature source Oral, resp. rate 16, height '5\' 1"'$  (1.549 m), weight 84.4 kg, last menstrual period 02/08/2021, SpO2 99 %, unknown if currently breastfeeding.  Physical Exam:  General: alert and no distress Lochia: appropriate Uterine Fundus: firm Incision: n/a DVT Evaluation: No evidence of DVT seen on physical exam.  Recent Labs    12/07/21 2303 12/08/21 0606  HGB 9.1* 9.3*  HCT 26.1* 26.4*    Assessment/Plan: Discharge home, Breastfeeding, Lactation consult, Social Work consult, and Circumcision prior to discharge Preeclampsia without severe features Continue procardia 30XL qd  RTO in 1 week for BP check SW consult - no barriers to discharge Patient desires circumcision for female infant. R/B/A discussed including but not limited to infection, bleeding, damage to surrounding structure. All questions answered. Informed written and verbal consent obtained.    LOS: 2 days   [name] Class, MD 12/09/2021, 11:22 AM

## 2021-12-09 NOTE — Lactation Note (Signed)
This note was copied from a baby's chart. Lactation Consultation Note  Patient Name: Tina Gibbs IONGE'X Date: 12/09/2021 Reason for consult: Follow-up assessment Age:23 hours LC reviewed and updated the doc flow sheets per mom.  Baby had recently fed and was cluster feeding. Per mom denies sore nipples and milk is coming in. Pumped some.  LC reviewed BF D/C and LC resources.  Maternal Data    Feeding Mother's Current Feeding Choice: Breast Milk  LATCH Score- baby recently fed   Lactation Tools Discussed/Used Tools: Pump Breast pump type: Double-Electric Breast Pump  Interventions Interventions: Breast feeding basics reviewed;Education;LC Services brochure;DEBP  Discharge Discharge Education: Engorgement and breast care;Warning signs for feeding baby Pump: Personal;DEBP  Consult Status Consult Status: Complete Date: 12/09/21    Jerlyn Ly Tamela Elsayed 12/09/2021, 9:02 AM

## 2021-12-09 NOTE — Clinical Social Work Maternal (Signed)
CLINICAL SOCIAL WORK MATERNAL/CHILD NOTE  Patient Details  Name: AILIS RIGAUD MRN: [id number] Date of Birth: 2000-02-14  Date:  12/09/2021  Clinical Social Worker Initiating Note:  Bernadene Bell Annamary Buschman Date/Time: Initiated:  12/09/21/1039     Child's Name:  Darlina Rumpf   Biological Parents:  Mother, Father Jehan Ranganathan 10/30/1998, Darlina Rumpf 09/13/1999)   Need for Interpreter:  None   Reason for Referral:  Current Substance Use/Substance Use During Pregnancy     Address:  [address] [place] [address]    Phone number:  [phone] (home)     Additional phone number:   Household Members/Support Persons (HM/SP):   Household Member/Support Person 1, Household Member/Support Person 2   HM/SP Name Relationship DOB or Age  HM/SP -1 Ada FOB 09/13/1999  HM/SP -2 Cho'siah Dorsett son 02/09/2019  HM/SP -3        HM/SP -4        HM/SP -5        HM/SP -6        HM/SP -7        HM/SP -8          Natural Supports (not living in the home):  Parent   Professional Supports: None   Employment: Unemployed   Type of Work:     Education:  Programmer, systems   Homebound arranged:    Museum/gallery curator Resources:  Medicaid   Other Resources:  Physicist, medical     Cultural/Religious Considerations Which May Impact Care:    Strengths:  Ability to meet basic needs  , Home prepared for child  , Pediatrician chosen   Psychotropic Medications:         Pediatrician:    [place] area  Pediatrician List:   [facility] for [place]      Pediatrician Fax Number:    Risk Factors/Current Problems:  Substance Use     Cognitive State:      Mood/Affect:  Calm  , Comfortable     CSW Assessment: CSW received consult for THC use during pregnancy. CSW met with MOB to complete assessment and offer support. CSW entered the room, introduced self, CSW  role and reason for visit. MOB was agreeable to visit  and allowed FOB to remain in the room during the assessment. CSW inquired about how MOB was feeling, MOB reported "ready to go home". MOB reported her delivery went fast and was good overall. CSW confirmed MOB's address and phone number, MOB reported they recently moved and the new address is [address], [place], [place] [address].   CSW inquired about noted THC use, MOB reported she stopped use when she found out the as pregnant. MOB reported she did use once around 7 months to reduce her nausea and to be able to eat. CSW explained hospital drug screen policy, MOB voiced understanding. CSW explained the CDS was pending and the UDS was not collected since MOB refused collection. CSW explained if the CDS was positive for any substances a CPS report would be made.  CSW provided education regarding the baby blues period vs. perinatal mood disorders, discussed treatment and gave resources for mental health follow up if concerns arise.  CSW recommends self-evaluation during the postpartum time period using the New Mom Checklist from Postpartum Progress and encouraged MOB to contact a medical  professional if symptoms are noted at any time.  MOB identified FOB, her mom and FOB grandmother as her supports.   CSW provided review of Sudden Infant Death Syndrome (SIDS) precautions. MOB reported they have all necessary items for the infant including a bassinet for him to sleep. CSW identifies no further need for intervention and no barriers to discharge at this time.  CSW Plan/Description:  No Further Intervention Required/No Barriers to Discharge, Sudden Infant Death Syndrome (SIDS) Education, Perinatal Mood and Anxiety Disorder (PMADs) Education, [place], CSW Will Continue to Monitor Umbilical Cord Tissue Drug Screen Results and Make Report if Renata Caprice, LCSW 12/09/2021, 10:51 AM

## 2021-12-10 LAB — URINE DRUGS OF ABUSE SCREEN W ALC, ROUTINE (REF LAB)
Amphetamines, Urine: NEGATIVE ng/mL
Barbiturate, Ur: NEGATIVE ng/mL
Benzodiazepine Quant, Ur: NEGATIVE ng/mL
Cocaine (Metab.): NEGATIVE ng/mL
Ethanol U, Quan: NEGATIVE %
Methadone Screen, Urine: NEGATIVE ng/mL
Opiate Quant, Ur: NEGATIVE ng/mL
Phencyclidine, Ur: NEGATIVE ng/mL
Propoxyphene, Urine: NEGATIVE ng/mL

## 2021-12-10 LAB — PANEL 799049
CARBOXY THC GC/MS CONF: 261 ng/mL
Cannabinoid GC/MS, Ur: POSITIVE — AB

## 2021-12-11 ENCOUNTER — Inpatient Hospital Stay (HOSPITAL_COMMUNITY): Payer: Medicaid Other

## 2021-12-11 ENCOUNTER — Inpatient Hospital Stay (HOSPITAL_COMMUNITY)
Admission: AD | Admit: 2021-12-11 | Payer: Medicaid Other | Source: Home / Self Care | Admitting: Obstetrics and Gynecology

## 2021-12-15 ENCOUNTER — Telehealth (HOSPITAL_COMMUNITY): Payer: Self-pay | Admitting: *Deleted

## 2021-12-15 NOTE — Telephone Encounter (Signed)
Left phone voicemail message.  Odis Hollingshead, RN 12-15-2021 at 9:32am

## 2022-04-09 ENCOUNTER — Ambulatory Visit
Admission: EM | Admit: 2022-04-09 | Discharge: 2022-04-09 | Disposition: A | Discharge status: Home / Self Care | Payer: Medicaid Other | Attending: Internal Medicine | Admitting: Internal Medicine

## 2022-04-09 DIAGNOSIS — J069 Acute upper respiratory infection, unspecified: Secondary | ICD-10-CM | POA: Diagnosis not present

## 2022-04-09 DIAGNOSIS — R062 Wheezing: Secondary | ICD-10-CM

## 2022-04-09 MED ORDER — IBUPROFEN 600 MG PO TABS: 600.0000 mg | ORAL_TABLET | Freq: Four times a day (QID) | ORAL | 0 refills | Status: DC | PRN

## 2022-04-09 MED ORDER — ALBUTEROL SULFATE (2.5 MG/3ML) 0.083% IN NEBU
2.5000 mg | INHALATION_SOLUTION | Freq: Once | RESPIRATORY_TRACT | Status: AC
  Administered 2022-04-09: 2.5 mg via RESPIRATORY_TRACT

## 2022-04-09 MED ORDER — ALBUTEROL SULFATE HFA 108 (90 BASE) MCG/ACT IN AERS: 1.0000 | INHALATION_SPRAY | Freq: Four times a day (QID) | RESPIRATORY_TRACT | 0 refills | Status: DC | PRN

## 2022-04-09 MED ORDER — IBUPROFEN 800 MG PO TABS
800.0000 mg | ORAL_TABLET | Freq: Once | ORAL | Status: AC
  Administered 2022-04-09: 800 mg via ORAL

## 2022-04-09 NOTE — Discharge Instructions (Signed)
It appears that you have a viral illness that should run its course and self resolve with the help of symptomatic treatment as we discussed.  I have prescribed prescription ibuprofen and inhaler.  Please do not take any additional ibuprofen, Advil, Aleve while taking prescription ibuprofen.  There is not any good cough medication for breast-feeding but the inhaler should be helpful.  Ensure adequate fluid hydration and rest.  Follow-up if any symptoms persist or worsen.

## 2022-04-09 NOTE — ED Triage Notes (Signed)
Pt c/o nasal congestion, facial pressure, cough,   Onset ~ 2 days ago
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
## 2022-04-16 ENCOUNTER — Ambulatory Visit (INDEPENDENT_AMBULATORY_CARE_PROVIDER_SITE_OTHER): Payer: Medicaid Other

## 2022-04-16 ENCOUNTER — Ambulatory Visit (HOSPITAL_COMMUNITY)
Admission: RE | Admit: 2022-04-16 | Discharge: 2022-04-16 | Disposition: A | Discharge status: Home / Self Care | Payer: Medicaid Other | Source: Ambulatory Visit | Attending: Internal Medicine | Admitting: Internal Medicine

## 2022-04-16 ENCOUNTER — Encounter (HOSPITAL_COMMUNITY): Payer: Self-pay

## 2022-04-16 VITALS — BP 125/85 | HR 94 | Temp 97.7°F | Resp 17

## 2022-04-16 DIAGNOSIS — R059 Cough, unspecified: Secondary | ICD-10-CM

## 2022-04-16 DIAGNOSIS — J019 Acute sinusitis, unspecified: Secondary | ICD-10-CM | POA: Diagnosis not present

## 2022-04-16 DIAGNOSIS — J4541 Moderate persistent asthma with (acute) exacerbation: Secondary | ICD-10-CM | POA: Diagnosis not present

## 2022-04-16 DIAGNOSIS — R0602 Shortness of breath: Secondary | ICD-10-CM | POA: Diagnosis not present

## 2022-04-16 MED ORDER — IPRATROPIUM-ALBUTEROL 0.5-2.5 (3) MG/3ML IN SOLN
RESPIRATORY_TRACT | Status: AC
  Filled 2022-04-16: qty 3

## 2022-04-16 MED ORDER — IPRATROPIUM-ALBUTEROL 0.5-2.5 (3) MG/3ML IN SOLN
3.0000 mL | Freq: Once | RESPIRATORY_TRACT | Status: AC
  Administered 2022-04-16: 3 mL via RESPIRATORY_TRACT

## 2022-04-16 MED ORDER — PREDNISONE 20 MG PO TABS: 40.0000 mg | ORAL_TABLET | Freq: Every day | ORAL | 0 refills | Status: AC

## 2022-04-16 MED ORDER — METHYLPREDNISOLONE SODIUM SUCC 125 MG IJ SOLR
80.0000 mg | Freq: Once | INTRAMUSCULAR | Status: AC
  Administered 2022-04-16: 80 mg via INTRAMUSCULAR

## 2022-04-16 MED ORDER — FLUCONAZOLE 150 MG PO TABS: 150.0000 mg | ORAL_TABLET | ORAL | 0 refills | Status: AC

## 2022-04-16 MED ORDER — ALBUTEROL SULFATE HFA 108 (90 BASE) MCG/ACT IN AERS: 1.0000 | INHALATION_SPRAY | Freq: Four times a day (QID) | RESPIRATORY_TRACT | 0 refills | Status: AC | PRN

## 2022-04-16 MED ORDER — METHYLPREDNISOLONE SODIUM SUCC 125 MG IJ SOLR
INTRAMUSCULAR | Status: AC
  Filled 2022-04-16: qty 2

## 2022-04-16 MED ORDER — AMOXICILLIN-POT CLAVULANATE 875-125 MG PO TABS: 1.0000 | ORAL_TABLET | Freq: Two times a day (BID) | ORAL | 0 refills | Status: AC

## 2022-04-16 NOTE — Discharge Instructions (Addendum)
Your evaluation shows you have a bacterial sinus infection plus a viral infection of the upper airways of your lungs. Use the following medicines to help with your symptoms:  - Take antibiotic sent to pharmacy as directed to treat sinus infection. - Take steroid sent to pharmacy as directed starting tomorrow. Do not take any other NSAID containing medications such as ibuprofen or naproxen/Aleve while taking prednisone. - You may use albuterol inhaler 1 to 2 puffs every 4-6 hours as needed for cough, shortness of breath, and wheezing. - Purchase Mucinex over the counter and take this every 12 hours as needed for nasal congestion and cough.  If you develop any new or worsening symptoms or do not improve in the next 2 to 3 days, please return.  If your symptoms are severe, please go to the emergency room.  Follow-up with your primary care provider for further evaluation and management of your symptoms as well as ongoing wellness visits.  I hope you feel better!
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
## 2022-04-16 NOTE — ED Triage Notes (Signed)
Pt c/o right ear pain for 3 days. Reports congestion & cough for over week. Was seen at [facility] at [facility] and given albuterol.

## 2023-04-17 ENCOUNTER — Encounter (HOSPITAL_COMMUNITY): Payer: Self-pay | Admitting: Emergency Medicine

## 2023-04-17 ENCOUNTER — Ambulatory Visit (HOSPITAL_COMMUNITY)
Admission: EM | Admit: 2023-04-17 | Discharge: 2023-04-17 | Disposition: A | Discharge status: Home / Self Care | Attending: Family Medicine | Admitting: Family Medicine

## 2023-04-17 DIAGNOSIS — Z3201 Encounter for pregnancy test, result positive: Secondary | ICD-10-CM | POA: Diagnosis not present

## 2023-04-17 DIAGNOSIS — Z3A01 Less than 8 weeks gestation of pregnancy: Secondary | ICD-10-CM

## 2023-04-17 LAB — POCT URINE PREGNANCY: Preg Test, Ur: POSITIVE — AB

## 2023-04-17 MED ORDER — DOXYLAMINE-PYRIDOXINE 10-10 MG PO TBEC: 1.0000 | DELAYED_RELEASE_TABLET | Freq: Two times a day (BID) | ORAL | 0 refills | Status: AC | PRN

## 2023-04-17 MED ORDER — PRENATAL PLUS VITAMIN/MINERAL 27-1 MG PO TABS: 1.0000 | ORAL_TABLET | Freq: Every day | ORAL | 0 refills | Status: AC

## 2023-04-17 NOTE — ED Triage Notes (Signed)
 Pt reports had home positive pregnancy test and wanting to confirm. LMP 03/12/23
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
## 2023-04-17 NOTE — Discharge Instructions (Signed)
 Prenatal vitamins take daily.  I have prescribed doxylamine/B6 to be up to twice daily as needed for nausea associated with pregnancy.

## 2023-05-08 ENCOUNTER — Inpatient Hospital Stay (HOSPITAL_COMMUNITY)
Admission: AD | Admit: 2023-05-08 | Discharge: 2023-05-08 | Disposition: A | Discharge status: Home / Self Care | Attending: Obstetrics and Gynecology | Admitting: Obstetrics and Gynecology

## 2023-05-08 ENCOUNTER — Inpatient Hospital Stay (HOSPITAL_COMMUNITY)

## 2023-05-08 ENCOUNTER — Encounter (HOSPITAL_COMMUNITY): Payer: Self-pay | Admitting: *Deleted

## 2023-05-08 DIAGNOSIS — Z3A01 Less than 8 weeks gestation of pregnancy: Secondary | ICD-10-CM | POA: Insufficient documentation

## 2023-05-08 DIAGNOSIS — O219 Vomiting of pregnancy, unspecified: Secondary | ICD-10-CM | POA: Insufficient documentation

## 2023-05-08 LAB — WET PREP, GENITAL
Clue Cells Wet Prep HPF POC: NONE SEEN
Sperm: NONE SEEN
Trich, Wet Prep: NONE SEEN
WBC, Wet Prep HPF POC: >=10 — AB (ref <10)
Yeast Wet Prep HPF POC: NONE SEEN

## 2023-05-08 LAB — URINALYSIS, ROUTINE W REFLEX MICROSCOPIC
Bilirubin Urine: NEGATIVE
Glucose, UA: NEGATIVE mg/dL
Ketones, ur: NEGATIVE mg/dL
Leukocytes,Ua: NEGATIVE
Nitrite: NEGATIVE
Protein, ur: NEGATIVE mg/dL
Specific Gravity, Urine: 1.019 (ref 1.005–1.030)
pH: 6 (ref 5.0–8.0)

## 2023-05-08 LAB — CBC
HCT: 34.5 % — ABNORMAL LOW (ref 36.0–46.0)
Hemoglobin: 11.9 g/dL — ABNORMAL LOW (ref 12.0–15.0)
MCH: 32.9 pg (ref 26.0–34.0)
MCHC: 34.5 g/dL (ref 30.0–36.0)
MCV: 95.3 fL (ref 80.0–100.0)
Platelets: 287 10*3/uL (ref 150–400)
RBC: 3.62 MIL/uL — ABNORMAL LOW (ref 3.87–5.11)
RDW: 12.8 % (ref 11.5–15.5)
WBC: 8.5 10*3/uL (ref 4.0–10.5)
nRBC: 0 % (ref 0.0–0.2)

## 2023-05-08 LAB — HCG, QUANTITATIVE, PREGNANCY: hCG, Beta Chain, Quant, S: 130444 m[IU]/mL — ABNORMAL HIGH (ref <5)

## 2023-05-08 LAB — ABO/RH: ABO/RH(D): O POS

## 2023-05-08 MED ORDER — DICLEGIS 10-10 MG PO TBEC: 2.0000 | DELAYED_RELEASE_TABLET | Freq: Two times a day (BID) | ORAL | 0 refills | Status: AC

## 2023-05-08 MED ORDER — ONDANSETRON 4 MG PO TBDP: 4.0000 mg | ORAL_TABLET | Freq: Four times a day (QID) | ORAL | 0 refills | Status: AC | PRN
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
## 2023-05-08 NOTE — MAU Note (Signed)
.  Tina Gibbs is a 23 y.o. at [redacted]w[redacted]d here in MAU reporting: had spotting all day yesterday. Today is having cramping and N/V not bleeding today  LMP: 03/12/23 Onset of complaint: yesterday Pain score: 5-6 Vitals:   05/08/23 0732  BP: 120/73  Pulse: 77  Resp: 18  Temp: 97.9 F (36.6 C)     FHT: n/a  Lab orders placed from triage: u/a , vag swabs

## 2023-05-08 NOTE — MAU Note (Signed)
 Registration came to our desk,   Tina Gibbs had left for .  She has now returned.  She had called while gone to see if her results were back yet.

## 2023-05-08 NOTE — Discharge Instructions (Signed)

## 2023-05-09 LAB — GC/CHLAMYDIA PROBE AMP (~~LOC~~) NOT AT ARMC
Chlamydia: NEGATIVE
Comment: NEGATIVE
Comment: NORMAL
Neisseria Gonorrhea: NEGATIVE

## 2023-05-18 NOTE — ED Provider Notes (Signed)
 [facility]  ED Provider Note  Valree Whitehouse 23 y.o. female DOB: 2000-09-25 MRN: [id number] History   Chief Complaint  Patient presents with  . Vaginal Bleeding (Pregnant)    Patient reports heavy bleeding and abdominal cramping since last night.  [redacted] weeks pregnant.   Joe Castrogiovanni is 23 y.o. PATRICIAANN female presents for elevation of vaginal bleeding. She tells me that she is having miscarriage because she is having the same symptoms she had with the last miscarriage in Sept 2024. She had onset of bright red vaginal bleeding last night and has used 12 pads since onset. Reports that she was passing clots and is having abdominal cramping. LMP 03/12/23. She reports that she was seen at [facility] for spotting on 05/07/23, reports that the spotting stopped but has continued to have cramping. Reports that she has not had sex since before 05/07/23.  She reports that she feels lightheaded.   History provided by:  Patient and medical records Language interpreter used: No   Vaginal Bleeding (Pregnant) Quality:  Bright red, clots and heavier than menses Associated symptoms: abdominal pain        Past Medical History:  Diagnosis Date  . Pre-eclampsia (*)     History reviewed. No pertinent surgical history.  Social History   Substance and Sexual Activity  Alcohol Use None   Social History   Tobacco Use  Smoking Status Never  Smokeless Tobacco Never   E-Cigarettes  . Vaping Use Current Every Day User   . Start Date    . Cartridges/Day    . Quit Date     Social History   Substance and Sexual Activity  Drug Use Not on file         Allergies  Allergen Reactions  . Doxycycline Itching  . Latex Itching    Home Medications   No medications on file    Primary Survey  Primary Survey  Review of Systems   Review of Systems  Constitutional: Negative.   HENT: Negative.    Eyes: Negative.   Respiratory: Negative.    Cardiovascular: Negative.    Gastrointestinal:  Positive for abdominal pain.  Endocrine: Negative.   Genitourinary:  Positive for vaginal bleeding.  Musculoskeletal: Negative.   Skin: Negative.   Neurological:  Positive for light-headedness.  Hematological:  Negative for adenopathy.  Psychiatric/Behavioral: Negative.      Physical Exam   ED Triage Vitals [05/18/23 0933]  BP 104/75  Heart Rate 88  Resp 17  SpO2 98 %  Temp 98.4 F (36.9 C)    Physical Exam  Nursing note and vitals reviewed. Constitutional: Vitals signs normalShe appears well-developed and well-nourished. She is active and cooperative.  HENT:  Head: Normocephalic and atraumatic.  Right Ear: Normal external ear.  Left Ear: Normal external ear.  Nose: Nose normal.  Mouth/Throat: Voice normal.  Eyes: EOM are intact. Conjunctivae are normal.  Neck: Normal range of motion and voice normal.  Cardiovascular: Normal rate, regular rhythm and intact distal pulses.  Pulmonary/Chest: Respiratory effort normal and breath sounds normal.  Abdominal: Soft. There is abdominal tenderness in the suprapubic area. Bowel sounds are normal.  Genitourinary:    Genitourinary Comments: Small amount of dark red blood present.  Chaperone present during entire pelvic exam.   Musculoskeletal: Normal range of motion.     Cervical back: Normal range of motion.   Neurological: She is alert and oriented to person, place, and time. Moves all extremities equally. Gait normal. She has  normal speech.  Skin: Skin is warm. Skin is dry.  Psychiatric: She has a normal mood and affect. Her behavior is normal.     ED Course   Lab results:   CBC AND DIFFERENTIAL - Abnormal      Result Value   WBC 11.7 (*)    RBC 3.43 (*)    HGB 11.3     HCT 32.2 (*)    MCV 93.9     MCH 32.9 (*)    MCHC 35.1     Plt Ct 253     RDW SD 44.2     MPV 9.9     NRBC% 0.0     Absolute NRBC Count 0.00     NEUTROPHIL % 79.6     LYMPHOCYTE % 14.5     MONOCYTE % 4.5     Eosinophil %  0.6     BASOPHIL % 0.3     IG% 0.5     ABSOLUTE NEUTROPHIL COUNT 9.28 (*)    ABSOLUTE LYMPHOCYTE COUNT 1.69     Absolute Monocyte Count 0.53     Absolute Eosinophil Count 0.07     Absolute Basophil Count 0.04     Absolute Immature Granulocyte Count 0.06 (*)   COMPREHENSIVE METABOLIC PANEL - Abnormal   Na 136     Potassium 3.3 (*)    Cl 102     CO2 22     AGAP 12     Glucose 83     BUN 7     Creatinine 0.40 (*)    Ca 8.7     ALK PHOS 46     T Bili 0.3     Total Protein 6.3     Alb 3.8     GLOBULIN 2.5     ALBUMIN/GLOBULIN RATIO 1.5     BUN/CREAT RATIO 17.5     ALT 13     AST 15     eGFR 142     Comment: Normal GFR (glomerular filtration rate) > 60 mL/min/1.73 meters squared, < 60 may include impaired kidney function. Calculation based on the Chronic Kidney Disease Epidemiology Collaboration (CK-EPI)equation refit without adjustment for race.  URINALYSIS W/MICRO REFLEX CULTURE - SYMPTOMATIC - Abnormal   Urine Color Orange (*)    Urine Clarity Cloudy (*)    Urine Specific Gravity 1.024     Urine pH 7.0     Urine Protein - Dipstick 20 (*)    Urine Glucose Negative     Urine Ketones 40 (*)    Urine Bilirubin Negative     Urine Blood >1.0 (*)    Urine Urobilinogen <2     Urine Nitrite Negative     Urine Leukocyte Esterase 75 (*)    Urine Squamous Epithelial Cells 4 (*)    Urine WBC 7 (*)    Urine RBC >182 (*)    Urine Bacteria Rare (*)    Urine Mucous Moderate (*)    UA Microscopic Yes Micro (*)    Narrative:    Does not meet criteria for reflex to Urine Culture.  HUMAN CHORIONIC GONADOTROPIN  (HCG), BETA-SUBUNIT, QUALITATIVE - Abnormal   Beta HCG Qual Positive (*)   MAGNESIUM - Normal   Mg 1.9    HCG, QUANTITATIVE   B HCG Quant 44,471     Comment: Female -  0 - 3 mIU/mL  Female Non-pregnant -   0 - 5 mIU/mL  Female Postmenopausal  - 0 - 8 mIU/mL  Female  Pregnant by week of gestation: 3 weeks-   6 - 71 mIU/mL 4 weeks-   10 - 750 mIU/mL 5 weeks-  217 - 7138  mIU/mL 6 weeks-  158 - 31795 mIU/mL 7 weeks- 3697 - 836436 mIU/mL 8 weeks- 67934 - 149571 mIU/mL 9 weeks-  36196 - 151410 mIU/mL 10 weeks- 53490 - 813022 mIU/mL 12 weeks- 72167 - 210612 mIU/mL 14 weeks- 86049 - 62530 mIU/mL 15 weeks- 87960 - 70971 mIU/mL 16 weeks- 9040 - 56451 mIU/mL 17 weeks- 8175 - 55868 mIU/mL 18 weeks- 8099 - 58176 mIU/mL   TYPE AND SCREEN   ABO Rh Type O POS     Antibody Screen NEG     Specimen Expiration 05/21/2023 23:59    PREVIOUS BLOOD BANK HISTORY?   Prev Pt History? NO    ABO RH CONFIRMATION   ABO Rh Type O POS      Imaging:   US  OB < 14 WKS TRANSABDOMINAL/TRANSVAGINAL   Narrative:    INDICATION: Abdominal Pain  COMPARISON: None  TECHNIQUE: Limited transabdominal/transvaginal OB ultrasound.   FINDINGS: Uterus measures 13.8 cm x 6.2 cm x 7.5 cm. Mixed echogenicity area is noted in the uterine fundus. It is unclear whether this is within the upper portion of the endometrial canal or within the myometrium. There is a single intrauterine gestational sac. Yolk sac and fetal pole noted. Estimated gestational age based on a crown-rump length of 2.18 cm is 8 weeks and 6 days. There is no fetal cardiac activity identified. Subchorionic hematoma is partially visualized.  Right ovary is not visualized sonographically. Left ovary is normal in appearance measuring 2.9 cm x 2.2 cm x 1.8 cm.    Impression:    IMPRESSION: 1. Single intrauterine gestational sac with a yolk sac and fetal pole noted. Estimated gestational age based on crown-rump length is 8 weeks and 6 days. There is no fetal cardiac activity identified. 2. Subchorionic hematoma is partially visualized. There is a mixed echogenicity area within the uterine fundus which could represent blood products in the upper portion of the endometrial canal versus a submucosal leiomyoma.    ###CODE SIGNIFICANT REPORT###  Automated notification pathway concerning the above report was activated at the time  below.   ORDERING PROVIDER (Unless stated above may not be the provider contacted): PAMELA H VETSCH TIME: 05/18/2023 1:20 PM   Electronically Signed by: Charlie Redfern MD on 05/18/2023 1:20 PM      ECG: ECG Results   None                                                                     Pre-Sedation Procedures  ED Course as of 05/18/23 1502  Sharlet DEL Vetsch's Documentation  Thu May 18, 2023  1138 Beta HCG Qual(!): Positive  1138 Magnesium: 1.9  1138 WBC(!): 11.7 Leukocytosis   1139 Hemoglobin: 11.3  1139 Neutrophils Absolute(!): 9.28 Elevated   1139 Potassium(!): 3.3 Hypokalemia, will replace  1140 Urinalysis w/ Reflex Microscopic; Reflex to Culture - Symptomatic(!) Cloudy, 20 protein, 40 ketones, >1.0 blood, 75 leukocytes, 4 squamous cells, 7 WBCs, >182 RBCs, rare bacteria, moderate mucous  1331 US  Ob < 14 Wks Transabdominal/Transvaginal 1. Single intrauterine gestational sac with a yolk sac and fetal pole noted. Estimated gestational age  based on crown-rump length is 8 weeks and 6 days. There is no fetal cardiac activity identified.  2. Subchorionic hematoma is partially visualized. There is a mixed echogenicity area within the uterine fundus which could represent blood products in the upper portion of the endometrial canal versus a submucosal leiomyoma.  I have personally reviewed images  1332 hCG,Quantitative: 55,528  1332 ABO Rh: O POS   Medical Decision Making Presents for evaluation of lower abdominal cramping and vaginal bleeding in pregnancy. Will check labs, US  OB, give IV fluids and reassess The differential diagnosis associated with the patient's presentation includes: SAB, normal pregnancy round ligament movement, ectopic, ovarian cyst, ovarian torsion, subchorionic hemorrhage, UTI, electrolyte abnormality, anemia, dehydration (not limited to) Reviewed labs with leukocytosis, elevated ANC, hypokalemia 3.3-placement  ordered, normal magnesium, beta quant 55528-discussed with patient Reviewed images of US  OB:1. Single intrauterine gestational sac with a yolk sac and fetal pole noted. Estimated gestational age based on crown-rump length is 8 weeks and 6 days. There is no fetal cardiac activity identified.  2. Subchorionic hematoma is partially visualized. There is a mixed echogenicity area within the uterine fundus which could represent blood products in the upper portion of the endometrial canal versus a submucosal leiomyoma. - discussed with patient Pelvic exam with small amount of dark red blood present  I considered admitting/observing and elected to, discharge home as she is stable for outpatient treatment and follow up. Discussed with patient pelvic rest, bleeding precautions. Advised her that she needs to schedule follow up with OB. Discussed discharge plan of care with her. Return precautions given. Questions welcomed and answered  Tests considered but not performed: no.  Prescription medication was considered but ultimately not given after discussion with patient/family (e.g., pain medication, antiviral, antibiotic) : antiviral  Chronic condition affecting patient care: Past Medical History: No date: Pre-eclampsia (*)  Patient's care significantly limited by social determinants of health including : Other social determinant of health. Limited healthcare literacy   Medications potassium chloride  (K-DUR,KLOR-CON ) CR tablet 40 mEq (has no administration in time range) NaCl 0.9% bolus 1,000 mL (0 mLs IntraVENous Stopped 05/18/23 1301) ondansetron  (ZOFRAN ) injection 4 mg (4 mg IntraVENous Given 05/18/23 1301) acetaminophen  (TYLENOL ) tablet 650 mg (650 mg Oral Given 05/18/23 1301)   Amount and/or Complexity of Data Reviewed Independent Historian:     Details: patient External Data Reviewed: labs, radiology and notes.    Details: Reviewed previous HPIs, labs, images Reviewed care everywhere - [facility] Reviewed US  at [place] on 05/08/23:Single live intra pregnancy of 7 weeks and 6 days. Positive fetal  heart motion. Small subchronic hemorrhage. Heart rate 153   Labs: ordered. Decision-making details documented in ED Course.    Details: CBC, CMP, Mg, Rh, quant, UA Radiology: ordered and independent interpretation performed. Decision-making details documented in ED Course.    Details: US  OB Discussion of management or test interpretation with external provider(s): 244pm: consulted OB, discussed with Dr Verlean up with OB in office, repeat US . She will also have someone reach out to help her schedule follow up  Risk OTC drugs. Prescription drug management. Decision regarding hospitalization. Diagnosis or treatment significantly limited by social determinants of health.           Provider Communication  New Prescriptions   No medications on file    Modified Medications   No medications on file    Discontinued Medications   No medications on file    Clinical Impression Final diagnoses:  Subchorionic hemorrhage of  placenta in first trimester (*)  Threatened miscarriage in early pregnancy (*)  Hypokalemia    ED Disposition     ED Disposition  Discharge   Condition  Stable   Comment  --                 Follow-up Information     Schedule an appointment as soon as possible for a visit  with Tex KATHEE Breen, MD.   Specialties: Obstetrics and Gynecology, Obstetrics, Gynecology Contact information: [address] [place] [address] [phone]         [facility] Emergency Department.   Specialty: Emergency Medicine Comments: As needed, If symptoms worsen Contact information: [address] [place]  [address] [phone]                 Electronically signed by:    Sharlet VEAR Salles, NP 05/18/23 [phone]

## 2023-10-31 ENCOUNTER — Encounter (HOSPITAL_BASED_OUTPATIENT_CLINIC_OR_DEPARTMENT_OTHER): Payer: Self-pay | Admitting: *Deleted

## 2023-10-31 ENCOUNTER — Emergency Department (HOSPITAL_BASED_OUTPATIENT_CLINIC_OR_DEPARTMENT_OTHER)
Admission: EM | Admit: 2023-10-31 | Discharge: 2023-10-31 | Disposition: A | Discharge status: Home / Self Care | Attending: Emergency Medicine | Admitting: Emergency Medicine

## 2023-10-31 ENCOUNTER — Other Ambulatory Visit: Payer: Self-pay

## 2023-10-31 DIAGNOSIS — Z91040 Latex allergy status: Secondary | ICD-10-CM | POA: Diagnosis not present

## 2023-10-31 DIAGNOSIS — Z79899 Other long term (current) drug therapy: Secondary | ICD-10-CM | POA: Diagnosis not present

## 2023-10-31 DIAGNOSIS — Z91010 Allergy to peanuts: Secondary | ICD-10-CM | POA: Insufficient documentation

## 2023-10-31 DIAGNOSIS — Z202 Contact with and (suspected) exposure to infections with a predominantly sexual mode of transmission: Secondary | ICD-10-CM | POA: Diagnosis present

## 2023-10-31 LAB — WET PREP, GENITAL
Clue Cells Wet Prep HPF POC: NONE SEEN
Sperm: NONE SEEN
Trich, Wet Prep: NONE SEEN
WBC, Wet Prep HPF POC: >=10 — AB (ref <10)
Yeast Wet Prep HPF POC: NONE SEEN

## 2023-10-31 NOTE — ED Provider Notes (Signed)
 [place] EMERGENCY DEPARTMENT AT [facility] Provider Note   CSN: [id number] Arrival date & time: 10/31/23  2236     Patient presents with: SEXUALLY TRANSMITTED DISEASE   Tina Gibbs is a 23 y.o. female here requesting STD evaluation.  States she was exposed to something a month ago.  Her last sexual activity was a month ago.  She denies any pelvic pain, dysuria, hematuria, vaginal discharge, rashes or lesions.  States she wants to be checked just in case.   HPI     Prior to Admission medications   Medication Sig Start Date End Date Taking? Authorizing Provider  albuterol  (VENTOLIN  HFA) 108 (90 Base) MCG/ACT inhaler Inhale 1-2 puffs into the lungs every 6 (six) hours as needed for wheezing or shortness of breath. 04/16/22   Enedelia Dorna HERO, FNP  amoxicillin -clavulanate (AUGMENTIN ) 875-125 MG tablet Take 1 tablet by mouth every 12 (twelve) hours. 04/16/22   Enedelia Dorna HERO, FNP  docusate sodium  (COLACE) 100 MG capsule Take 1 capsule (100 mg total) by mouth 2 (two) times daily. 12/09/21   Ogunbekun, Eboni D, DO  Doxylamine -Pyridoxine  10-10 MG TBEC Take 1 tablet by mouth 2 (two) times daily as needed (nausea and vomiting associated with pregnancy). 04/17/23   Arloa Suzen RAMAN, NP  ferrous sulfate  (FERROUSUL) 325 (65 FE) MG tablet Take 1 tablet (325 mg total) by mouth daily with breakfast. 12/09/21 12/09/22  Ogunbekun, Eboni D, DO  fluconazole  (DIFLUCAN ) 150 MG tablet Take 1 tablet (150 mg total) by mouth every 7 (seven) days. 04/16/22   Enedelia Dorna HERO, FNP  NIFEdipine  (ADALAT  CC) 30 MG 24 hr tablet Take 1 tablet (30 mg total) by mouth daily. 12/10/21 12/10/22  Ogunbekun, Eboni D, DO  ondansetron  (ZOFRAN -ODT) 4 MG disintegrating tablet Take 1 tablet (4 mg total) by mouth every 6 (six) hours as needed for nausea. 05/08/23   Leveque, Alyssa, MD  Prenatal Vit-Fe Fumarate-FA (PRENATAL PLUS VITAMIN/MINERAL) 27-1 MG TABS Take 1 tablet by mouth daily at 12 noon.  04/17/23   Arloa Suzen RAMAN, NP    Allergies: Latex and Peanut-containing drug products    Review of Systems  Constitutional: Negative.   Gastrointestinal: Negative.   Genitourinary: Negative.   All other systems reviewed and are negative.   Updated Vital Signs BP 113/73 (BP Location: Right Arm)   Pulse 90   Temp 99.7 F (37.6 C) (Oral)   Resp 18   LMP 10/24/2023   SpO2 99%   Breastfeeding Unknown   Physical Exam Vitals and nursing note reviewed.  Constitutional:      General: She is not in acute distress.    Appearance: She is well-developed. She is not ill-appearing.  HENT:     Head: Atraumatic.  Eyes:     Pupils: Pupils are equal, round, and reactive to light.  Cardiovascular:     Rate and Rhythm: Normal rate.  Pulmonary:     Effort: No respiratory distress.  Abdominal:     General: There is no distension.  Musculoskeletal:        General: Normal range of motion.     Cervical back: Normal range of motion.  Skin:    General: Skin is warm and dry.  Neurological:     General: No focal deficit present.     Mental Status: She is alert.  Psychiatric:        Mood and Affect: Mood normal.     (all labs ordered are listed, but only abnormal results are displayed) Labs  Reviewed  WET PREP, GENITAL - Abnormal; Notable for the following components:      Result Value   WBC, Wet Prep HPF POC >=10 (*)    All other components within normal limits  URINALYSIS, ROUTINE W REFLEX MICROSCOPIC  PREGNANCY, URINE  GC/CHLAMYDIA PROBE AMP ([place]) NOT AT [facility]    EKG: None  Radiology: No results found.   Procedures   Medications Ordered in the ED - No data to display  23 year old here for eval and STD testing.  Currently asymptomatic.  Patient elects to self swab.  Be called if results are positive.  Last sexual activity 1 month ago.  Pt presents with concerns for possible STD.  Pt understands that they have GC/Chlamydia cultures pending and that they will need  to inform all sexual partners if results return positive. Patient to be discharged with instructions to follow up with OBGYN/PCP. Discussed importance of using protection when sexually active.                                     Medical Decision Making Amount and/or Complexity of Data Reviewed External Data Reviewed: labs, radiology and notes. Labs: ordered. Decision-making details documented in ED Course.       Final diagnoses:  Possible exposure to STD    ED Discharge Orders     None          Wessie Shanks A, PA-C 10/31/23 7664    Dreama Longs, MD 11/02/23 1357

## 2023-10-31 NOTE — ED Triage Notes (Signed)
 Pt would like STD testing.  Currently not having any symptoms but she feels she was exposed.

## 2023-10-31 NOTE — Discharge Instructions (Signed)
 You will be called if your results are positive  Make sure to follow up outpatient. Return for new or worsening symptoms

## 2023-11-01 ENCOUNTER — Ambulatory Visit (HOSPITAL_COMMUNITY): Payer: Self-pay

## 2023-11-01 LAB — URINALYSIS, ROUTINE W REFLEX MICROSCOPIC
Bacteria, UA: NONE SEEN
Bilirubin Urine: NEGATIVE
Glucose, UA: NEGATIVE mg/dL
Ketones, ur: NEGATIVE mg/dL
Leukocytes,Ua: NEGATIVE
Nitrite: NEGATIVE
Protein, ur: NEGATIVE mg/dL
Specific Gravity, Urine: 1.018 (ref 1.005–1.030)
pH: 6 (ref 5.0–8.0)

## 2023-11-01 LAB — PREGNANCY, URINE: Preg Test, Ur: NEGATIVE

## 2023-11-02 LAB — GC/CHLAMYDIA PROBE AMP (~~LOC~~) NOT AT ARMC
Chlamydia: POSITIVE — AB
Comment: NEGATIVE
Comment: NORMAL
Neisseria Gonorrhea: NEGATIVE

## 2023-11-03 ENCOUNTER — Ambulatory Visit (HOSPITAL_COMMUNITY): Payer: Self-pay

## 2023-11-03 ENCOUNTER — Encounter (HOSPITAL_BASED_OUTPATIENT_CLINIC_OR_DEPARTMENT_OTHER): Payer: Self-pay | Admitting: Emergency Medicine

## 2023-11-03 ENCOUNTER — Telehealth (HOSPITAL_COMMUNITY): Payer: Self-pay

## 2023-11-03 ENCOUNTER — Telehealth (HOSPITAL_BASED_OUTPATIENT_CLINIC_OR_DEPARTMENT_OTHER): Payer: Self-pay | Admitting: Emergency Medicine

## 2023-11-03 MED ORDER — AZITHROMYCIN 500 MG PO TABS: 1000.0000 mg | ORAL_TABLET | Freq: Once | ORAL | 0 refills | Status: AC

## 2023-11-03 NOTE — Telephone Encounter (Signed)
 This patient tested positive for Chlamydia   She is allergic to doxycycline, , latex and peanut, I have informed the patient of her results and confirmed her pharmacy is correct in her chart. Treatment has been sent per protocol.   Arlander Katheryn RAMAN, RN
